# Patient Record
Sex: Female | Born: 1990 | Race: Black or African American | Hispanic: No | Marital: Single | Smoking: Current every day smoker
Health system: Southern US, Community
[De-identification: ages and names within clinical notes are randomized; demographics above are authoritative.]

## PROBLEM LIST (undated history)

## (undated) ENCOUNTER — Inpatient Hospital Stay (HOSPITAL_COMMUNITY): Payer: Self-pay

## (undated) ENCOUNTER — Emergency Department (HOSPITAL_COMMUNITY): Discharge status: Against Medical Advice | Payer: Self-pay

## (undated) DIAGNOSIS — N760 Acute vaginitis: Secondary | ICD-10-CM

## (undated) DIAGNOSIS — R51 Headache: Secondary | ICD-10-CM

## (undated) DIAGNOSIS — I2699 Other pulmonary embolism without acute cor pulmonale: Secondary | ICD-10-CM

## (undated) DIAGNOSIS — I10 Essential (primary) hypertension: Secondary | ICD-10-CM

## (undated) DIAGNOSIS — B9689 Other specified bacterial agents as the cause of diseases classified elsewhere: Secondary | ICD-10-CM

## (undated) DIAGNOSIS — N883 Incompetence of cervix uteri: Secondary | ICD-10-CM

## (undated) DIAGNOSIS — N390 Urinary tract infection, site not specified: Secondary | ICD-10-CM

## (undated) DIAGNOSIS — O1493 Unspecified pre-eclampsia, third trimester: Secondary | ICD-10-CM

## (undated) DIAGNOSIS — O139 Gestational [pregnancy-induced] hypertension without significant proteinuria, unspecified trimester: Secondary | ICD-10-CM

## (undated) DIAGNOSIS — O88219 Thromboembolism in pregnancy, unspecified trimester: Secondary | ICD-10-CM

## (undated) HISTORY — PX: THERAPEUTIC ABORTION: SHX798

## (undated) HISTORY — DX: Unspecified pre-eclampsia, third trimester: O14.93

## (undated) HISTORY — PX: BREAST FIBROADENOMA SURGERY: SHX580

## (undated) HISTORY — PX: DILATION AND CURETTAGE OF UTERUS: SHX78

## (undated) HISTORY — PX: TUBAL LIGATION: SHX77

---
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
## 2008-01-28 ENCOUNTER — Emergency Department (HOSPITAL_COMMUNITY): Admission: EM | Admit: 2008-01-28 | Discharge: 2008-01-28 | Payer: Self-pay | Admitting: Family Medicine

## 2008-08-03 ENCOUNTER — Emergency Department (HOSPITAL_COMMUNITY): Admission: EM | Admit: 2008-08-03 | Discharge: 2008-08-03 | Payer: Self-pay | Admitting: Emergency Medicine

## 2008-08-03 ENCOUNTER — Emergency Department (HOSPITAL_COMMUNITY): Admission: EM | Admit: 2008-08-03 | Discharge: 2008-08-03 | Payer: Self-pay | Admitting: Family Medicine

## 2008-08-13 ENCOUNTER — Emergency Department (HOSPITAL_COMMUNITY): Admission: EM | Admit: 2008-08-13 | Discharge: 2008-08-13 | Payer: Self-pay | Admitting: Emergency Medicine

## 2008-11-30 ENCOUNTER — Ambulatory Visit: Payer: Self-pay | Admitting: Internal Medicine

## 2008-11-30 ENCOUNTER — Encounter (INDEPENDENT_AMBULATORY_CARE_PROVIDER_SITE_OTHER): Payer: Self-pay | Admitting: Internal Medicine

## 2008-11-30 DIAGNOSIS — R3919 Other difficulties with micturition: Secondary | ICD-10-CM | POA: Insufficient documentation

## 2008-11-30 DIAGNOSIS — E669 Obesity, unspecified: Secondary | ICD-10-CM | POA: Insufficient documentation

## 2008-11-30 LAB — CONVERTED CEMR LAB
Bilirubin Urine: NEGATIVE
Blood in Urine, dipstick: NEGATIVE
Glucose, Urine, Semiquant: NEGATIVE
Ketones, urine, test strip: NEGATIVE
Nitrite: NEGATIVE
Protein, U semiquant: NEGATIVE
Specific Gravity, Urine: 1.015
Urobilinogen, UA: 1
WBC Urine, dipstick: NEGATIVE
pH: 6

## 2008-12-18 ENCOUNTER — Emergency Department (HOSPITAL_COMMUNITY): Admission: EM | Admit: 2008-12-18 | Discharge: 2008-12-19 | Payer: Self-pay | Admitting: Emergency Medicine

## 2009-01-03 ENCOUNTER — Encounter: Payer: Self-pay | Admitting: Internal Medicine

## 2009-01-03 ENCOUNTER — Ambulatory Visit: Payer: Self-pay | Admitting: Internal Medicine

## 2009-01-03 DIAGNOSIS — Z87891 Personal history of nicotine dependence: Secondary | ICD-10-CM

## 2009-02-18 ENCOUNTER — Emergency Department (HOSPITAL_COMMUNITY): Admission: EM | Admit: 2009-02-18 | Discharge: 2009-02-18 | Payer: Self-pay | Admitting: Emergency Medicine

## 2009-07-28 ENCOUNTER — Telehealth (INDEPENDENT_AMBULATORY_CARE_PROVIDER_SITE_OTHER): Payer: Self-pay | Admitting: *Deleted

## 2009-07-28 ENCOUNTER — Encounter (INDEPENDENT_AMBULATORY_CARE_PROVIDER_SITE_OTHER): Payer: Self-pay | Admitting: *Deleted

## 2009-11-01 ENCOUNTER — Inpatient Hospital Stay (HOSPITAL_COMMUNITY)
Admission: AD | Admit: 2009-11-01 | Discharge: 2009-11-01 | Payer: Self-pay | Source: Home / Self Care | Admitting: Obstetrics and Gynecology

## 2009-12-19 ENCOUNTER — Ambulatory Visit: Payer: Self-pay | Admitting: Obstetrics & Gynecology

## 2009-12-19 ENCOUNTER — Inpatient Hospital Stay (HOSPITAL_COMMUNITY): Admission: AD | Admit: 2009-12-19 | Discharge: 2009-12-19 | Payer: Self-pay | Admitting: Obstetrics & Gynecology

## 2010-01-07 ENCOUNTER — Ambulatory Visit: Payer: Self-pay | Admitting: Advanced Practice Midwife

## 2010-01-07 ENCOUNTER — Inpatient Hospital Stay (HOSPITAL_COMMUNITY): Admission: AD | Admit: 2010-01-07 | Discharge: 2010-01-07 | Payer: Self-pay | Admitting: Obstetrics & Gynecology

## 2010-01-21 ENCOUNTER — Emergency Department (HOSPITAL_COMMUNITY): Admission: EM | Admit: 2010-01-21 | Discharge: 2010-01-21 | Payer: Self-pay | Admitting: Emergency Medicine

## 2010-03-23 ENCOUNTER — Ambulatory Visit (HOSPITAL_COMMUNITY)
Admission: RE | Admit: 2010-03-23 | Discharge: 2010-03-23 | Payer: Self-pay | Source: Home / Self Care | Attending: Obstetrics & Gynecology | Admitting: Obstetrics & Gynecology

## 2010-04-13 ENCOUNTER — Ambulatory Visit (HOSPITAL_COMMUNITY)
Admission: RE | Admit: 2010-04-13 | Discharge: 2010-04-13 | Payer: Self-pay | Source: Home / Self Care | Attending: Obstetrics & Gynecology | Admitting: Obstetrics & Gynecology

## 2010-05-04 ENCOUNTER — Other Ambulatory Visit: Payer: Self-pay | Admitting: Obstetrics & Gynecology

## 2010-05-04 ENCOUNTER — Ambulatory Visit (HOSPITAL_COMMUNITY)
Admission: RE | Admit: 2010-05-04 | Discharge: 2010-05-04 | Payer: Self-pay | Source: Home / Self Care | Attending: Obstetrics & Gynecology | Admitting: Obstetrics & Gynecology

## 2010-05-04 DIAGNOSIS — IMO0001 Reserved for inherently not codable concepts without codable children: Secondary | ICD-10-CM

## 2010-05-09 NOTE — Progress Notes (Signed)
Summary: RECALL LETTER  Phone Note Outgoing Call   Call placed by: Gentry Fitz,  July 28, 2009 9:49 AM Call placed to: Patient Summary of Call: We attempted to call patient in order to schedule a return appointment, since her last [facility] office visit was more than one year ago.  Since we were unable to reach patient by phone, a letter was sent asking her to contact us.

## 2010-05-09 NOTE — Letter (Signed)
Summary: [facility] RECALL LETTER  All     ,     Phone:   Fax:     07/28/2009   [facility] [address] [place], [place]  [address]   Dear  [name],   You are due to follow-up with a doctor at the [facility].  We have been unable to contact you by phone.  If you would like to schedule a visit, please call [phone].  If you are receiving your health care somewhere else, please call us and we will take your name off our patient list.  Healthy regards,  Raynaldo Opitz, Director [facility]

## 2010-05-10 ENCOUNTER — Inpatient Hospital Stay (HOSPITAL_COMMUNITY)
Admission: AD | Admit: 2010-05-10 | Discharge: 2010-05-10 | Disposition: A | Discharge status: Home / Self Care | Payer: Medicaid Other | Source: Ambulatory Visit | Attending: Obstetrics | Admitting: Obstetrics

## 2010-05-10 ENCOUNTER — Inpatient Hospital Stay (HOSPITAL_COMMUNITY): Admission: AD | Admit: 2010-05-10 | Payer: Self-pay | Source: Ambulatory Visit | Admitting: Obstetrics

## 2010-05-10 DIAGNOSIS — O30009 Twin pregnancy, unspecified number of placenta and unspecified number of amniotic sacs, unspecified trimester: Secondary | ICD-10-CM

## 2010-05-10 DIAGNOSIS — O26839 Pregnancy related renal disease, unspecified trimester: Secondary | ICD-10-CM

## 2010-05-12 LAB — URINE CULTURE
Colony Count: >=100000
Culture  Setup Time: 201202021031

## 2010-05-15 ENCOUNTER — Inpatient Hospital Stay (HOSPITAL_COMMUNITY)
Admission: AD | Admit: 2010-05-15 | Discharge: 2010-05-22 | DRG: 765 | Disposition: A | Discharge status: Home / Self Care | Payer: Medicaid Other | Source: Ambulatory Visit | Attending: Obstetrics & Gynecology | Admitting: Obstetrics & Gynecology

## 2010-05-15 DIAGNOSIS — IMO0002 Reserved for concepts with insufficient information to code with codable children: Secondary | ICD-10-CM

## 2010-05-15 DIAGNOSIS — O1410 Severe pre-eclampsia, unspecified trimester: Secondary | ICD-10-CM

## 2010-05-15 DIAGNOSIS — O3090 Multiple gestation, unspecified, unspecified trimester: Secondary | ICD-10-CM | POA: Diagnosis present

## 2010-05-15 DIAGNOSIS — O30049 Twin pregnancy, dichorionic/diamniotic, unspecified trimester: Secondary | ICD-10-CM

## 2010-05-15 DIAGNOSIS — IMO0001 Reserved for inherently not codable concepts without codable children: Secondary | ICD-10-CM

## 2010-05-15 DIAGNOSIS — O1414 Severe pre-eclampsia complicating childbirth: Principal | ICD-10-CM | POA: Diagnosis present

## 2010-05-15 DIAGNOSIS — O30009 Twin pregnancy, unspecified number of placenta and unspecified number of amniotic sacs, unspecified trimester: Secondary | ICD-10-CM | POA: Diagnosis present

## 2010-05-15 DIAGNOSIS — O365990 Maternal care for other known or suspected poor fetal growth, unspecified trimester, not applicable or unspecified: Secondary | ICD-10-CM | POA: Diagnosis present

## 2010-05-15 LAB — COMPREHENSIVE METABOLIC PANEL
ALT: 9 U/L (ref 0–35)
AST: 45 U/L — ABNORMAL HIGH (ref 0–37)
Albumin: 2 g/dL — ABNORMAL LOW (ref 3.5–5.2)
Alkaline Phosphatase: 373 U/L — ABNORMAL HIGH (ref 39–117)
BUN: 6 mg/dL (ref 6–23)
CO2: 21 mEq/L (ref 19–32)
Chloride: 106 mEq/L (ref 96–112)
Creatinine, Ser: 0.86 mg/dL (ref 0.4–1.2)
GFR calc Af Amer: >60 mL/min (ref >60)
GFR calc non Af Amer: >60 mL/min (ref >60)
Potassium: 5.1 mEq/L (ref 3.5–5.1)
Sodium: 134 mEq/L — ABNORMAL LOW (ref 135–145)
Total Bilirubin: 1.2 mg/dL (ref 0.3–1.2)

## 2010-05-15 LAB — LACTATE DEHYDROGENASE: LDH: 386 U/L — ABNORMAL HIGH (ref 94–250)

## 2010-05-15 LAB — CBC
HCT: 34.2 % — ABNORMAL LOW (ref 36.0–46.0)
Hemoglobin: 11.5 g/dL — ABNORMAL LOW (ref 12.0–15.0)
MCH: 29 pg (ref 26.0–34.0)
MCHC: 33.6 g/dL (ref 30.0–36.0)
MCV: 86.1 fL (ref 78.0–100.0)
Platelets: 196 10*3/uL (ref 150–400)
RBC: 3.97 MIL/uL (ref 3.87–5.11)
RDW: 12.8 % (ref 11.5–15.5)

## 2010-05-15 LAB — ABO/RH: ABO/RH(D): O POS

## 2010-05-15 LAB — TYPE AND SCREEN
ABO/RH(D): O POS
Antibody Screen: NEGATIVE

## 2010-05-16 LAB — CBC
HCT: 32.4 % — ABNORMAL LOW (ref 36.0–46.0)
Hemoglobin: 10.9 g/dL — ABNORMAL LOW (ref 12.0–15.0)
MCH: 29.3 pg (ref 26.0–34.0)
MCHC: 33.6 g/dL (ref 30.0–36.0)
MCV: 87.1 fL (ref 78.0–100.0)
Platelets: 172 10*3/uL (ref 150–400)
RDW: 12.9 % (ref 11.5–15.5)
WBC: 7.7 10*3/uL (ref 4.0–10.5)

## 2010-05-16 LAB — LACTATE DEHYDROGENASE: LDH: 159 U/L (ref 94–250)

## 2010-05-16 LAB — COMPREHENSIVE METABOLIC PANEL
AST: 22 U/L (ref 0–37)
Albumin: 1.7 g/dL — ABNORMAL LOW (ref 3.5–5.2)
Alkaline Phosphatase: 325 U/L — ABNORMAL HIGH (ref 39–117)
BUN: 5 mg/dL — ABNORMAL LOW (ref 6–23)
Calcium: 8.4 mg/dL (ref 8.4–10.5)
Chloride: 107 mEq/L (ref 96–112)
Creatinine, Ser: 0.7 mg/dL (ref 0.4–1.2)
GFR calc Af Amer: >60 mL/min (ref >60)
GFR calc non Af Amer: >60 mL/min (ref >60)
Glucose, Bld: 93 mg/dL (ref 70–99)
Potassium: 4 mEq/L (ref 3.5–5.1)
Sodium: 137 mEq/L (ref 135–145)
Total Bilirubin: 0.4 mg/dL (ref 0.3–1.2)
Total Protein: 5.3 g/dL — ABNORMAL LOW (ref 6.0–8.3)

## 2010-05-17 ENCOUNTER — Inpatient Hospital Stay (HOSPITAL_COMMUNITY): Payer: Medicaid Other

## 2010-05-17 ENCOUNTER — Other Ambulatory Visit (HOSPITAL_COMMUNITY): Payer: Medicaid Other

## 2010-05-17 ENCOUNTER — Other Ambulatory Visit: Payer: Self-pay | Admitting: Obstetrics & Gynecology

## 2010-05-17 DIAGNOSIS — O1490 Unspecified pre-eclampsia, unspecified trimester: Secondary | ICD-10-CM

## 2010-05-17 LAB — CBC
MCH: 28.9 pg (ref 26.0–34.0)
MCHC: 33.3 g/dL (ref 30.0–36.0)
MCV: 86.5 fL (ref 78.0–100.0)
Platelets: 166 10*3/uL (ref 150–400)
Platelets: 178 10*3/uL (ref 150–400)
RBC: 3.94 MIL/uL (ref 3.87–5.11)
WBC: 8.2 10*3/uL (ref 4.0–10.5)

## 2010-05-17 LAB — COMPREHENSIVE METABOLIC PANEL
AST: 19 U/L (ref 0–37)
Albumin: 1.6 g/dL — ABNORMAL LOW (ref 3.5–5.2)
BUN: 2 mg/dL — ABNORMAL LOW (ref 6–23)
CO2: 23 mEq/L (ref 19–32)
Calcium: 8.3 mg/dL — ABNORMAL LOW (ref 8.4–10.5)
Creatinine, Ser: 0.75 mg/dL (ref 0.4–1.2)
GFR calc Af Amer: >60 mL/min (ref >60)
GFR calc non Af Amer: >60 mL/min (ref >60)

## 2010-05-17 LAB — URIC ACID: Uric Acid, Serum: 7.5 mg/dL — ABNORMAL HIGH (ref 2.4–7.0)

## 2010-05-18 ENCOUNTER — Other Ambulatory Visit (HOSPITAL_COMMUNITY): Payer: Medicaid Other

## 2010-05-18 ENCOUNTER — Ambulatory Visit (HOSPITAL_COMMUNITY): Payer: Medicaid Other

## 2010-05-18 LAB — COMPREHENSIVE METABOLIC PANEL
ALT: 12 U/L (ref 0–35)
AST: 27 U/L (ref 0–37)
Albumin: 1.6 g/dL — ABNORMAL LOW (ref 3.5–5.2)
Alkaline Phosphatase: 321 U/L — ABNORMAL HIGH (ref 39–117)
CO2: 24 mEq/L (ref 19–32)
Chloride: 108 mEq/L (ref 96–112)
Creatinine, Ser: 0.87 mg/dL (ref 0.4–1.2)
GFR calc Af Amer: >60 mL/min (ref >60)
GFR calc non Af Amer: >60 mL/min (ref >60)
Potassium: 3.9 mEq/L (ref 3.5–5.1)
Sodium: 139 mEq/L (ref 135–145)
Total Bilirubin: 0.2 mg/dL — ABNORMAL LOW (ref 0.3–1.2)

## 2010-05-18 LAB — CBC
Hemoglobin: 11.2 g/dL — ABNORMAL LOW (ref 12.0–15.0)
MCH: 28.9 pg (ref 26.0–34.0)
Platelets: 187 10*3/uL (ref 150–400)
RBC: 3.87 MIL/uL (ref 3.87–5.11)
WBC: 10.9 10*3/uL — ABNORMAL HIGH (ref 4.0–10.5)

## 2010-05-21 NOTE — H&P (Addendum)
NAMECRIS, [name]                 ACCOUNT NO.:  [id number]  MEDICAL RECORD NO.:  [id number]           PATIENT TYPE:  I  LOCATION:  [facility]                         [facility]  PHYSICIAN:  Roseanna Rainbow, M.D.DATE OF BIRTH:  Jan 28, 1991  DATE OF ADMISSION:  05/10/2010 DATE OF DISCHARGE:  05/10/2010                             HISTORY & PHYSICAL   CHIEF COMPLAINT:  The patient is a 20 year old gravida 1, para 0 with an estimated date of confinement by ultrasound of July 03, 2010, with a twin gestation now with proteinuria.  HISTORY OF PRESENT ILLNESS:  Please see the above.  Over the last several prenatal visits, proteinuria 2+ to 3+ was noted on urine dips. A urine culture on March 13, 2010, was positive for greater than 100,000 colonies per mL E-coli and this was treated.  On April 27, 2010, there was a negative test of cure.  The patient completed a 24-hour urine.  On May 11, 2010, a creatinine clearance was 68, and the protein was 3120 mg.  The patient's blood pressures were in the 120s to 180s range. Baseline blood pressure at 20 weeks was 108/73.  There is no significant edema or water weight gain.  ALLERGIES:  No known drug allergies.  MEDICATIONS:  Prenatal vitamins.  OB RISK FACTORS:  Please see the above.  The patient was initially diagnosed with a spontaneous triplet pregnancy and there was a vanishing fetus in the second trimester.  VACCINATIONS:  The patient received flu vaccine.  PRENATAL LABORATORY DATA:  A PIH panel was remarkable for creatinine of 0.96 on May 11, 2010, as well as uric acid of 7.5.  Chlamydia probe negative.  GC probe negative.  Two-hour GTT normal.  Hepatitis B surface antigen negative.  Hematocrit 34.3, hemoglobin 11.4, HIV nonreactive, platelets 195,000.  Blood type is O+, antibody screen negative, RPR nonreactive, rubella immune, sickle cell negative.  Ultrasound on February 21, 2010, at 21 weeks the estimated fetal  weight for twin A was at the 55th percentile, for twin B is 46th percentile, dividing membrane was seen, it was felt to be dichorionic, diamniotic, it is fundal.  An ultrasound on May 04, 2010, twin A was breech, normal amniotic fluid, estimated fetal weight was at the 29th percentile for a 33-week 4- day gestation, umbilical artery Dopplers were normal.  Twin B is cephalic at the 14th percentile for 33-week 4-day gestation, normal Dopplers.  On April 13, 2010, the estimated fetal weight for twin B was back to the 26th percentile for 30-week 4-day gestation, again umbilical artery Dopplers were normal.  Twin B was at the 13th percentile for 30- week 4-day gestation and umbilical artery Dopplers were normal,  normal amniotic fluid volumes again for both.  Asymmetric growth was noted for both twins.  On March 23, 2010, twin A was at the 27th percentile for a 27-week 4-day gestation, normal amniotic fluid, twin B was at the 25th percentile for 27 weeks 4 days gestation.  PAST GYN HISTORY:  Noncontributory.  PAST MEDICAL HISTORY:  Bronchitis.  SOCIAL HISTORY:  She is unemployed, single, does not give any  significant history of alcohol usage, has no significant smoking history.  Denies illicit drug use.  FAMILY HISTORY:  Remarkable for adult-onset diabetes, hypertension.  REVIEW OF SYSTEMS:  Noncontributory.  PHYSICAL EXAMINATION:  VITAL SIGNS:  Stable, afebrile. GENERAL:  No apparent distress. ABDOMEN:  Gravid. PELVIC:  Deferred.  ASSESSMENT:  Twin gestation at 32+ weeks amniotic dichorionic with asymmetric growth for the twins now with proteinuria, borderline blood pressure elevations.  PLAN:  This plan was reviewed with Dr. Rica Koyanagi of maternal fetal medicine who recommends admission with heightened surveillance of the twins as well as serial labs, possible delivery for worsening maternal or fetal status.     Roseanna Rainbow, M.D.     [name]  D:   05/15/2010  T:  05/15/2010  Job:  [id number]  Electronically Signed by Antionette Char M.D. on 06/19/2010 09:49:25 PM

## 2010-05-25 ENCOUNTER — Ambulatory Visit (HOSPITAL_COMMUNITY): Payer: Self-pay

## 2010-05-28 NOTE — Op Note (Signed)
NAMEKAINAT, PIZANA                 ACCOUNT NO.:  [id number]  MEDICAL RECORD NO.:  [id number]           PATIENT TYPE:  I  LOCATION:  9372                          [facility]  PHYSICIAN:  Roseanna Rainbow, M.D.DATE OF BIRTH:  06/09/1990  DATE OF PROCEDURE:  05/17/2010 DATE OF DISCHARGE:                              OPERATIVE REPORT   PREOPERATIVE DIAGNOSES:  Twin gestation at 21 plus weeks, diamniotic- dichorionic, severe preeclampsia, intrauterine growth restriction twin B with estimated fetal weight at less than 10th percentile.  POSTOPERATIVE DIAGNOSES:  Twin gestation at 55 plus weeks, diamniotic- dichorionic, severe preeclampsia, intrauterine growth restriction twin B with estimated fetal weight at less than 10th percentile.  PROCEDURE:  Primary low uterine flap elliptical cesarean delivery via transverse incision.  SURGEONS:  Roseanna Rainbow, MD and Coral Ceo, MD  ANESTHESIA:  Spinal.  FINDINGS:  Twin A frank breech, twin B cephalic presentation.  PATHOLOGY:  Placenta.  ESTIMATED BLOOD LOSS:  400 mL.  COMPLICATIONS:  None.  DESCRIPTION OF PROCEDURE:  The patient was taken to the operating room with an IV running.  A spinal anesthetic was administered.  She was then placed in the dorsal supine position with a leftward tilt and prepped and draped in usual sterile fashion.  After time-out had been completed, a transverse incision was made with a scalpel.  This was carried down to the underlying fascia with Bovie.  The fascia was nicked in the midline. This incision was then extended bilaterally.  The superior aspect of the fascial incision was tented up and the underlying rectus muscles dissected off.  The inferior aspect of the fascial incision was manipulated in a similar fashion.  The rectus muscles were separated in the midline.  The parietal peritoneum was tented up and entered sharply. This incision was then extended superiorly and inferiorly  with good visualization of the bladder.  The bladder blade was then placed.  The vesicouterine peritoneum was tented up and entered sharply.  This incision was then extended bilaterally and the bladder flap was created using blunt dissection.  The bladder blade was then replaced.  The lower uterine segment was incised in a transverse fashion with the scalpel. This incision was then extended bluntly.  Twin A was delivered as a complete breech extraction.  The cord was clamped and cut.  The head was delivered atraumatically.  The infant was handed off to the awaiting neonatologist.  Twin B was delivered in cephalic presentation.  The head was delivered atraumatically.  The cord was clamped and cut.  The infant was handed off to the awaiting neonatologist.  An umbilical artery pH were sent for both the fetuses as well as cord blood.  The placenta was then removed.  The intrauterine cavity was evacuated of any remaining amniotic fluid, clots, and debris with moistened laparotomy sponge.  The uterine incision was then reapproximated in a running fashion using a suture of 0 Monocryl.  A second imbricating layer of the same suture was then placed.  Adequate hemostasis was noted.  The paracolic gutters were then irrigated.  The parietal peritoneum was reapproximated in  a running fashion using a suture of 2-0 Vicryl.  The fascia was closed with two running sutures of 0 Vicryl tied in the midline.  2-0 plain was used to close the dead space and approximated the subcutaneous layer.  The skin was closed in a subcuticular fashion using a 4-0 sutures of Monocryl. At the close of the procedure, the instrument and pack counts were said to be correct x2.  The patient was taken to the PACU awake and in stable condition.     Roseanna Rainbow, M.D.     Judee Clara  D:  05/17/2010  T:  05/18/2010  Job:  [id number]  Electronically Signed by Antionette Char M.D. on 05/28/2010 02:35:42 PM

## 2010-05-28 NOTE — Discharge Summary (Signed)
  NAMESASCHA, [name]                 ACCOUNT NO.:  [id number]  MEDICAL RECORD NO.:  [id number]           PATIENT TYPE:  I  LOCATION:  9319                          [facility]  PHYSICIAN:  Roseanna Rainbow, M.D.DATE OF BIRTH:  Mar 21, 1991  DATE OF ADMISSION:  05/15/2010 DATE OF DISCHARGE:  05/22/2010                              DISCHARGE SUMMARY   CHIEF COMPLAINT:  The patient is a 20 year old para 0 with an estimated date of confinement by ultrasound of June 18, 2010, with a twin gestation and proteinuria.  HISTORY OF PRESENT ILLNESS:  Please see the dictated history and physical.  HOSPITAL COURSE:  The patient was admitted to the antenatal unit.  The patient's blood pressures were elevated in the 140s to 150s over 80s to 90s range.  On May 17, 2010, the uric acid was 7.5.  The remainder of her labs were normal.  The NSTs were reactive.  Maternal Fetal Medicine was consulted.  Twin B was noted to have IUGR with an estimated fetal weight at less than 10th percentile.  In the setting of the preeclampsia, the recommendation was made for delivery.  Twin A was breech presentation.  The patient was brought for a cesarean delivery. Please see the dictated operative summary for findings.  On postoperative day #1, a hemoglobin was 11.  The patient had been admitted to the AICU and started on magnesium sulfate for seizure prophylaxis. On May 19, 2010, the patient was noted to be diuresing and the magnesium sulfate was discontinued. She was transferred to the floor.  The patient had been started on labetalol orally to optimize blood pressure control.  The dosae was titrated to 200 mg p.o. t.i.d.  At discharge, the patient's blood pressures were better control.  DISCHARGE DIAGNOSES:  Twin gestation at 64 plus weeks severe preeclampsia intrauterine growth restriction.  PROCEDURE:  Cesarean delivery.  CONDITION:  Stable.  DIET:  Regular.  ACTIVITY:  Pelvic rest  progressive activity.  MEDICATIONS:  Labetalol 300 mg p.o. b.i.d. and Percocet 5/325 one to two tabs every 6 hours as needed.  DISPOSITION:  The patient was to follow up in the office in 2 days for blood pressure check.    Roseanna Rainbow, M.D.    [name]  D:  05/22/2010  T:  05/22/2010  Job:  [id number]  Electronically Signed by Antionette Char M.D. on 05/28/2010 02:37:54 PM

## 2010-06-06 ENCOUNTER — Encounter: Payer: Self-pay | Admitting: Internal Medicine

## 2010-06-22 LAB — URINE CULTURE: Colony Count: >=100000

## 2010-06-22 LAB — URINALYSIS, ROUTINE W REFLEX MICROSCOPIC
Glucose, UA: NEGATIVE mg/dL
Glucose, UA: NEGATIVE mg/dL
Protein, ur: NEGATIVE mg/dL
Specific Gravity, Urine: 1.02 (ref 1.005–1.030)
pH: 6 (ref 5.0–8.0)
pH: 6.5 (ref 5.0–8.0)

## 2010-06-22 LAB — URINE MICROSCOPIC-ADD ON

## 2010-06-22 LAB — WET PREP, GENITAL: Trich, Wet Prep: NONE SEEN

## 2010-06-24 LAB — POCT PREGNANCY, URINE: Preg Test, Ur: POSITIVE

## 2010-07-12 LAB — POCT CARDIAC MARKERS: CKMB, poc: <1 ng/mL — ABNORMAL LOW (ref 1.0–8.0)

## 2010-08-25 NOTE — Op Note (Signed)
NAMESHAREN, YOUNGREN                 ACCOUNT NO.:  [id number]   MEDICAL RECORD NO.:  [id number]          PATIENT TYPE:  AMB   LOCATION:  DSC                          FACILITY:  [facility]   PHYSICIAN:  Leonie Man, M.D.   DATE OF BIRTH:  10-19-1990   DATE OF PROCEDURE:  09/25/2004  DATE OF DISCHARGE:                                 OPERATIVE REPORT   PREOPERATIVE DIAGNOSIS:  Fibroadenoma, right breast.   POSTOPERATIVE DIAGNOSIS:  Fibroadenoma, right breast.   PROCEDURE:  Excision of fibroadenoma, right breast.   SURGEON:  Leonie Man, M.D.   ASSISTANT:  OR tech.   ANESTHESIA:  General.   The patient a 20 year old, female student presenting with two newly  discovered breast masses in the upper inner quadrant at approximately the 1  o'clock axis. She comes to the operating room now desiring excision after  consultation with both her mother and father. She understands the risks and  potential benefits of surgery and gives consent.   DESCRIPTION OF PROCEDURE:  Following induction of satisfactory general  anesthesia, the patient was positioned supinely. The right breast was  prepped and draped to be included in a sterile operative field. A  circumareolar incision was carried down along the superior areolar border  and deepened through skin and subcutaneous tissue. Dissection carried up to  the masses which are lying adjacent to each other and are approximately 7 cm  away from the areolar border. The masses are dissected free individually,  removed and forwarded for pathologic evaluation. They clinically are  fibroadenoma. Hemostasis obtained within the breast using electrocautery.  Sponge and instrument counts verified. Subcutaneous tissues closed with  interrupted 3-0 Vicryl sutures and the skin closed with running 5-0 Monocryl  suture and then reinforced with Steri-Strips. Sterile dressings are applied.  Anesthetic reversed. The patient was removed from the operating room to the  recovery room in stable condition. She tolerated the procedure well.       PB/MEDQ  D:  09/25/2004  T:  09/25/2004  Job:  [id number]

## 2010-10-14 ENCOUNTER — Inpatient Hospital Stay (HOSPITAL_COMMUNITY)
Admission: AD | Admit: 2010-10-14 | Discharge: 2010-10-14 | Disposition: A | Discharge status: Home / Self Care | Payer: Medicaid Other | Source: Ambulatory Visit | Attending: Obstetrics & Gynecology | Admitting: Obstetrics & Gynecology

## 2010-10-14 ENCOUNTER — Encounter (HOSPITAL_COMMUNITY): Payer: Self-pay | Admitting: Obstetrics and Gynecology

## 2010-10-14 ENCOUNTER — Inpatient Hospital Stay (HOSPITAL_COMMUNITY): Payer: Medicaid Other | Admitting: Obstetrics & Gynecology

## 2010-10-14 DIAGNOSIS — B3731 Acute candidiasis of vulva and vagina: Secondary | ICD-10-CM | POA: Insufficient documentation

## 2010-10-14 DIAGNOSIS — B373 Candidiasis of vulva and vagina: Secondary | ICD-10-CM

## 2010-10-14 DIAGNOSIS — L293 Anogenital pruritus, unspecified: Secondary | ICD-10-CM | POA: Insufficient documentation

## 2010-10-14 LAB — GLUCOSE, CAPILLARY: Glucose-Capillary: 89 mg/dL (ref 70–99)

## 2010-10-14 LAB — WET PREP, GENITAL
Clue Cells Wet Prep HPF POC: NONE SEEN
Trich, Wet Prep: NONE SEEN
Yeast Wet Prep HPF POC: NONE SEEN

## 2010-10-14 MED ORDER — FLUCONAZOLE 150 MG PO TABS
150.0000 mg | ORAL_TABLET | Freq: Once | ORAL | Status: AC
  Administered 2010-10-14: 150 mg via ORAL

## 2010-10-14 MED ORDER — FLUCONAZOLE 150 MG PO TABS
ORAL_TABLET | ORAL | Status: AC
  Administered 2010-10-14: 150 mg via ORAL
  Filled 2010-10-14: qty 1

## 2010-10-14 NOTE — Progress Notes (Signed)
Pt presents to MAU with symptoms of Yeast infection. Pt states her skin is breaking on the outside. Pt states when she takes a bath her skin feels raw. Pt states the symptoms started 3 weeks ago. No odor or discharge.

## 2010-10-14 NOTE — Progress Notes (Signed)
Vaginal itching for 3 weeks, had twins on 05/17/10 by C/Sno discharge, no odor

## 2010-10-14 NOTE — ED Provider Notes (Signed)
History     Chief Complaint  Patient presents with  . Vaginal Itching   Patient is a 20 y.o. female presenting with vaginal itching. The history is provided by the patient.  Vaginal Itching This is a new problem. The current episode started 1 to 4 weeks ago. The problem occurs constantly. The problem has been gradually worsening. She has tried nothing for the symptoms.      No past medical history on file.  Past Surgical History  Procedure Date  . 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    Removal  . Cesarean section     Family History  Problem Relation Age of Onset  . Diabetes Mother   . Diabetes Maternal Grandmother   . Hypertension Maternal Grandmother   . Cancer Maternal Grandmother     colon cancer, brain tumour    History  Substance Use Topics  . Smoking status: Former Games developer  . Smokeless tobacco: Not on file  . Alcohol Use: No    Allergies: No Known Allergies  No prescriptions prior to admission    Review of Systems  Genitourinary:       Positive for vaginal itching and d/c.  All other systems reviewed and are negative.   Physical Exam   Blood pressure 131/80, pulse 94, temperature 98 F (36.7 C), temperature source Oral, resp. rate 18, height 5' 4.5" (1.638 m), weight 225 lb (102.059 kg), last menstrual period 09/24/2010, unknown if currently breastfeeding.  Physical Exam  Constitutional: She is oriented to person, place, and time. She appears well-developed and well-nourished.  HENT:  Head: Normocephalic.  GI: Soft.       Nontender  Genitourinary:       Thick white vaginal d/c that is blood tinged.  Neurological: She is alert and oriented to person, place, and time.  Skin: Skin is warm and dry.    MAU Course  Procedures  MDM    Kerrie Buffalo, NP 10/14/10 1231

## 2010-12-28 LAB — POCT RAPID STREP A: Streptococcus, Group A Screen (Direct): NEGATIVE

## 2011-06-18 ENCOUNTER — Inpatient Hospital Stay (HOSPITAL_COMMUNITY): Payer: Medicaid Other

## 2011-06-18 ENCOUNTER — Encounter (HOSPITAL_COMMUNITY): Payer: Self-pay | Admitting: *Deleted

## 2011-06-18 ENCOUNTER — Inpatient Hospital Stay (HOSPITAL_COMMUNITY)
Admission: AD | Admit: 2011-06-18 | Discharge: 2011-06-18 | Disposition: A | Discharge status: Home / Self Care | Payer: Medicaid Other | Source: Ambulatory Visit | Attending: Obstetrics & Gynecology | Admitting: Obstetrics & Gynecology

## 2011-06-18 DIAGNOSIS — O26899 Other specified pregnancy related conditions, unspecified trimester: Secondary | ICD-10-CM

## 2011-06-18 DIAGNOSIS — O99891 Other specified diseases and conditions complicating pregnancy: Secondary | ICD-10-CM | POA: Insufficient documentation

## 2011-06-18 DIAGNOSIS — R109 Unspecified abdominal pain: Secondary | ICD-10-CM | POA: Insufficient documentation

## 2011-06-18 NOTE — Discharge Instructions (Signed)
Abdominal Pain During Pregnancy  Abdominal discomfort is common in pregnancy. Most of the time, it does not cause harm. There are many causes of abdominal pain. Some causes are more serious than others. Some of the causes of abdominal pain in pregnancy are easily diagnosed. Occasionally, the diagnosis takes time to understand. Other times, the cause is not determined. Abdominal pain can be a sign that something is very wrong with the pregnancy, or the pain may have nothing to do with the pregnancy at all. For this reason, always tell your caregiver if you have any abdominal discomfort.  CAUSES  Common and harmless causes of abdominal pain include:   Constipation.   Excess gas and bloating.   Round ligament pain. This is pain that is felt in the folds of the groin.   The position the baby or placenta is in.   Baby kicks.   Braxton-Hicks contractions. These are mild contractions that do not cause cervical dilation.  Serious causes of abdominal pain include:   Ectopic pregnancy. This happens when a fertilized egg implants outside of the uterus.   Miscarriage.   Preterm labor. This is when labor starts at less than 37 weeks of pregnancy.   Placental abruption. This is when the placenta partially or completely separates from the uterus.   Preeclampsia. This is often associated with high blood pressure and has been referred to as "toxemia in pregnancy."   Uterine or amniotic fluid infections.  Causes unrelated to pregnancy include:   Urinary tract infection.   Gallbladder stones or inflammation.   Hepatitis or other liver illness.   Intestinal problems, stomach flu, food poisoning, or ulcer.   Appendicitis.   Kidney (renal) stones.   Kidney infection (pylonephritis).  HOME CARE INSTRUCTIONS   For mild pain:   Do not have sexual intercourse or put anything in your vagina until your symptoms go away completely.   Get plenty of rest until your pain improves. If your pain does not improve in 1 hour, call  your caregiver.   Drink clear fluids if you feel nauseous. Avoid solid food as long as you are uncomfortable or nauseous.   Only take medicine as directed by your caregiver.   Keep all follow-up appointments with your caregiver.  SEEK IMMEDIATE MEDICAL CARE IF:   You are bleeding, leaking fluid, or passing tissue from the vagina.   You have increasing pain or cramping.   You have persistent vomiting.   You have painful or bloody urination.   You have a fever.   You notice a decrease in your baby's movements.   You have extreme weakness or feel faint.   You have shortness of breath, with or without abdominal pain.   You develop a severe headache with abdominal pain.   You have abnormal vaginal discharge with abdominal pain.   You have persistent diarrhea.   You have abdominal pain that continues even after rest, or gets worse.  MAKE SURE YOU:    Understand these instructions.   Will watch your condition.   Will get help right away if you are not doing well or get worse.  Document Released: 03/26/2005 Document Revised: 03/15/2011 Document Reviewed: 10/20/2010  Exit[employer] Patient Information 2012 [employer].

## 2011-06-18 NOTE — MAU Note (Signed)
No period March, Feb menses unsure of length, January normal period, cramping when hungry, no bleeding

## 2011-06-18 NOTE — MAU Provider Note (Signed)
History     CSN: [id number]  Arrival date and time: 06/18/11 1543   First Provider Initiated Contact with Patient 06/18/11 1622      Chief Complaint  Patient presents with  . Possible Pregnancy   HPI [name] is 21 y.o. G1P3  Unknown weeks presenting with cramps.  Called Dr. Tamela Oddi today and instructed to come here for evaluation.  LMP in Feb, unsure of date.  Has + UPT at office 4 days ago.  Sent here to find out how far along she was because "they couldn't do it in the office".   She last had injection of  Depo in June, no contraception since.  She is having cramping.  Denies vaginal bleeding.   She has 23 year old triplets.    No past medical history on file.  Past Surgical History  Procedure Date  . 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    Removal  . Cesarean section     Family History  Problem Relation Age of Onset  . Diabetes Mother   . Diabetes Maternal Grandmother   . Hypertension Maternal Grandmother   . Cancer Maternal Grandmother     colon cancer, brain tumour    History  Substance Use Topics  . Smoking status: Former Games developer  . Smokeless tobacco: Not on file  . Alcohol Use: No    Allergies: No Known Allergies  No prescriptions prior to admission    Review of Systems  Constitutional: Negative.  Negative for fever and chills.  Gastrointestinal: Positive for abdominal pain (cramping.). Negative for nausea and vomiting.  Genitourinary:       Negative for vaginal bleeding  Psychiatric/Behavioral: The patient is not nervous/anxious.    Physical Exam   Last menstrual period 05/13/2011.  Physical Exam  Constitutional: She is oriented to person, place, and time. She appears well-developed and well-nourished.  HENT:  Head: Normocephalic.  Respiratory: Effort normal.  GI: There is no tenderness. There is no rebound and no guarding.  Genitourinary: Uterus is not enlarged and not tender. Cervix exhibits no discharge. Right adnexum displays no  mass, no tenderness and no fullness. Left adnexum displays no mass, no tenderness and no fullness. No bleeding around the vagina. No vaginal discharge found.  Neurological: She is alert and oriented to person, place, and time.  Skin: Skin is warm and dry.   Results for orders placed during the hospital encounter of 06/18/11 (from the past 24 hour(s))  POCT PREGNANCY, URINE     Status: Abnormal   Collection Time   06/18/11  4:10 PM      Component Value Range   Preg Test, Ur POSITIVE (*) NEGATIVE   HCG, QUANTITATIVE, PREGNANCY     Status: Abnormal   Collection Time   06/18/11  4:39 PM      Component Value Range   hCG, Beta Chain, Quant, S 2385 (*) <5 (mIU/mL)       *RADIOLOGY REPORT*  Clinical Data: Pregnancy. Cramping. Unsure of LMP.  OBSTETRIC <14 WK [place] AND TRANSVAGINAL OB US  Technique: Both transabdominal and transvaginal ultrasound  examinations were performed for complete evaluation of the  gestation as well as the maternal uterus, adnexal regions, and  pelvic cul-de-sac. Transvaginal technique was performed to assess  early pregnancy.  Comparison: None.  Intrauterine gestational sac: Probable early intrauterine  gestational sac.  Yolk sac: Not visualized  Embryo: Not visualized  MSD: 4.5 mm 5 w 0 d  Maternal uterus/adnexae:  No subchorionic hemorrhage. Probable corpus luteum  cyst within the  left ovary. Right ovary is unremarkable within normal limits.  Trace amount of free pelvic fluid is seen. No adnexal mass is seen.  IMPRESSION:  1. Probable early intrauterine gestational sac. Suggest follow-up  ultrasound in 2 weeks to confirm the suspected early intrauterine  pregnancy.  2. No adnexal mass identified.  Original Report Authenticated By: Britta Mccreedy, M.D.     MAU Course  Procedures    BLOOD TYPE PREVIOUS RECORD==O Positive  MDM   Assessment and Plan  A;  Cramping in early pregnancy- probably IUGS [redacted]w[redacted]d      P:  Reported results to patient.  Encouraged her  to make appointment with her doctor to repeat ultrasound her radiologist's recommendation     Report to her doctor any severe abdominal pain or vaginal bleeding.  Arryn Terrones,EVE M 06/18/2011, 4:23 PM

## 2011-06-18 NOTE — MAU Note (Signed)
Went to [facility] for Depo Shot, was told she was pregnant, urine test done, no other test done, did not see MD that visit, told to come to [facility] for how far pregnancy

## 2011-08-07 ENCOUNTER — Emergency Department (HOSPITAL_COMMUNITY)
Admission: EM | Admit: 2011-08-07 | Discharge: 2011-08-08 | Disposition: A | Discharge status: Home / Self Care | Payer: Medicaid Other | Attending: Emergency Medicine | Admitting: Emergency Medicine

## 2011-08-07 ENCOUNTER — Encounter (HOSPITAL_COMMUNITY): Payer: Self-pay | Admitting: Family Medicine

## 2011-08-07 ENCOUNTER — Emergency Department (HOSPITAL_COMMUNITY): Payer: Medicaid Other

## 2011-08-07 DIAGNOSIS — R42 Dizziness and giddiness: Secondary | ICD-10-CM | POA: Insufficient documentation

## 2011-08-07 DIAGNOSIS — K0889 Other specified disorders of teeth and supporting structures: Secondary | ICD-10-CM

## 2011-08-07 DIAGNOSIS — Z79899 Other long term (current) drug therapy: Secondary | ICD-10-CM | POA: Insufficient documentation

## 2011-08-07 DIAGNOSIS — R0602 Shortness of breath: Secondary | ICD-10-CM | POA: Insufficient documentation

## 2011-08-07 DIAGNOSIS — J069 Acute upper respiratory infection, unspecified: Secondary | ICD-10-CM | POA: Insufficient documentation

## 2011-08-07 DIAGNOSIS — I10 Essential (primary) hypertension: Secondary | ICD-10-CM | POA: Insufficient documentation

## 2011-08-07 DIAGNOSIS — N390 Urinary tract infection, site not specified: Secondary | ICD-10-CM | POA: Insufficient documentation

## 2011-08-07 DIAGNOSIS — K089 Disorder of teeth and supporting structures, unspecified: Secondary | ICD-10-CM | POA: Insufficient documentation

## 2011-08-07 DIAGNOSIS — R079 Chest pain, unspecified: Secondary | ICD-10-CM | POA: Insufficient documentation

## 2011-08-07 HISTORY — DX: Essential (primary) hypertension: I10

## 2011-08-07 LAB — CBC
MCHC: 33.2 g/dL (ref 30.0–36.0)
Platelets: 277 10*3/uL (ref 150–400)
RDW: 13.6 % (ref 11.5–15.5)

## 2011-08-07 LAB — BASIC METABOLIC PANEL
GFR calc Af Amer: >90 mL/min (ref >90)
GFR calc non Af Amer: >90 mL/min (ref >90)
Potassium: 3.3 mEq/L — ABNORMAL LOW (ref 3.5–5.1)
Sodium: 132 mEq/L — ABNORMAL LOW (ref 135–145)

## 2011-08-07 LAB — POCT PREGNANCY, URINE: Preg Test, Ur: NEGATIVE

## 2011-08-07 LAB — TROPONIN I: Troponin I: <0.3 ng/mL (ref <0.30)

## 2011-08-07 MED ORDER — MORPHINE SULFATE 4 MG/ML IJ SOLN
4.0000 mg | Freq: Once | INTRAMUSCULAR | Status: AC
  Administered 2011-08-07: 4 mg via INTRAVENOUS
  Filled 2011-08-07: qty 1

## 2011-08-07 MED ORDER — SODIUM CHLORIDE 0.9 % IV BOLUS (SEPSIS)
1000.0000 mL | Freq: Once | INTRAVENOUS | Status: AC
  Administered 2011-08-07: 1000 mL via INTRAVENOUS

## 2011-08-07 NOTE — ED Notes (Signed)
Patient states that she started having a toothache yesterday. Also c/o dizziness, chest pain off and on since yesterday,states "feel like my heart beating fast" and shortness of breath. More short of breath with ambulation. Patient currently on oral contraceptives.

## 2011-08-08 DIAGNOSIS — R42 Dizziness and giddiness: Secondary | ICD-10-CM | POA: Diagnosis present

## 2011-08-08 LAB — URINALYSIS, ROUTINE W REFLEX MICROSCOPIC
Bilirubin Urine: NEGATIVE
Nitrite: POSITIVE — AB
Specific Gravity, Urine: 1.011 (ref 1.005–1.030)
Urobilinogen, UA: 0.2 mg/dL (ref 0.0–1.0)

## 2011-08-08 LAB — URINE MICROSCOPIC-ADD ON

## 2011-08-08 MED ORDER — CIPROFLOXACIN HCL 500 MG PO TABS: 500.0000 mg | ORAL_TABLET | Freq: Two times a day (BID) | ORAL | Status: AC

## 2011-08-08 MED ORDER — HYDROCODONE-ACETAMINOPHEN 5-325 MG PO TABS: 1.0000 | ORAL_TABLET | ORAL | Status: AC | PRN

## 2011-08-08 MED ORDER — PENICILLIN G BENZATHINE 1200000 UNIT/2ML IM SUSP
1.2000 10*6.[IU] | Freq: Once | INTRAMUSCULAR | Status: AC
  Administered 2011-08-08: 1.2 10*6.[IU] via INTRAMUSCULAR
  Filled 2011-08-08: qty 2

## 2011-08-08 MED ORDER — MECLIZINE HCL 25 MG PO TABS: 25.0000 mg | ORAL_TABLET | Freq: Four times a day (QID) | ORAL | Status: AC

## 2011-08-08 MED ORDER — POTASSIUM CHLORIDE CRYS ER 20 MEQ PO TBCR
40.0000 meq | EXTENDED_RELEASE_TABLET | Freq: Once | ORAL | Status: AC
  Administered 2011-08-08: 40 meq via ORAL
  Filled 2011-08-08: qty 2
  Filled 2011-08-08: qty 1

## 2011-08-08 NOTE — Discharge Instructions (Signed)
Dental Pain A tooth ache may be caused by cavities (tooth decay). Cavities expose the nerve of the tooth to air and hot or cold temperatures. It may come from an infection or abscess (also called a boil or furuncle) around your tooth. It is also often caused by dental caries (tooth decay). This causes the pain you are having. DIAGNOSIS  Your caregiver can diagnose this problem by exam. TREATMENT   If caused by an infection, it may be treated with medications which kill germs (antibiotics) and pain medications as prescribed by your caregiver. Take medications as directed.   Only take over-the-counter or prescription medicines for pain, discomfort, or fever as directed by your caregiver.   Whether the tooth ache today is caused by infection or dental disease, you should see your dentist as soon as possible for further care.  SEEK MEDICAL CARE IF: The exam and treatment you received today has been provided on an emergency basis only. This is not a substitute for complete medical or dental care. If your problem worsens or new problems (symptoms) appear, and you are unable to meet with your dentist, call or return to this location. SEEK IMMEDIATE MEDICAL CARE IF:   You have a fever.   You develop redness and swelling of your face, jaw, or neck.   You are unable to open your mouth.   You have severe pain uncontrolled by pain medicine.  MAKE SURE YOU:   Understand these instructions.   Will watch your condition.   Will get help right away if you are not doing well or get worse.  Document Released: 03/26/2005 Document Revised: 03/15/2011 Document Reviewed: 11/12/2007 [facility] Patient Information 2012 [place], [place].Benign Positional Vertigo Vertigo means you feel like you or your surroundings are moving when they are not. Benign positional vertigo is the most common form of vertigo. Benign means that the cause of your condition is not serious. Benign positional vertigo is more common in older  adults. CAUSES  Benign positional vertigo is the result of an upset in the labyrinth system. This is an area in the middle ear that helps control your balance. This may be caused by a viral infection, head injury, or repetitive motion. However, often no specific cause is found. SYMPTOMS  Symptoms of benign positional vertigo occur when you move your head or eyes in different directions. Some of the symptoms may include:  Loss of balance and falls.   Vomiting.   Blurred vision.   Dizziness.   Nausea.   Involuntary eye movements (nystagmus).  DIAGNOSIS  Benign positional vertigo is usually diagnosed by physical exam. If the specific cause of your benign positional vertigo is unknown, your caregiver may perform imaging tests, such as magnetic resonance imaging (MRI) or computed tomography (CT). TREATMENT  Your caregiver may recommend movements or procedures to correct the benign positional vertigo. Medicines such as meclizine, benzodiazepines, and medicines for nausea may be used to treat your symptoms. In rare cases, if your symptoms are caused by certain conditions that affect the inner ear, you may need surgery. HOME CARE INSTRUCTIONS   Follow your caregiver's instructions.   Move slowly. Do not make sudden body or head movements.   Avoid driving.   Avoid operating heavy machinery.   Avoid performing any tasks that would be dangerous to you or others during a vertigo episode.   Drink enough fluids to keep your urine clear or pale yellow.  SEEK IMMEDIATE MEDICAL CARE IF:   You develop problems with walking, weakness,  numbness, or using your arms, hands, or legs.   You have difficulty speaking.   You develop severe headaches.   Your nausea or vomiting continues or gets worse.   You develop visual changes.   Your family or friends notice any behavioral changes.   Your condition gets worse.   You have a fever.   You develop a stiff neck or sensitivity to light.  MAKE  SURE YOU:   Understand these instructions.   Will watch your condition.   Will get help right away if you are not doing well or get worse.  Document Released: 01/01/2006 Document Revised: 03/15/2011 Document Reviewed: 12/14/2010 [facility] Patient Information 2012 [place], [place].Urinary Tract Infection Infections of the urinary tract can start in several places. A bladder infection (cystitis), a kidney infection (pyelonephritis), and a prostate infection (prostatitis) are different types of urinary tract infections (UTIs). They usually get better if treated with medicines (antibiotics) that kill germs. Take all the medicine until it is gone. You or your child may feel better in a few days, but TAKE ALL MEDICINE or the infection may not respond and may become more difficult to treat. HOME CARE INSTRUCTIONS   Drink enough water and fluids to keep the urine clear or pale yellow. Cranberry juice is especially recommended, in addition to large amounts of water.   Avoid caffeine, tea, and carbonated beverages. They tend to irritate the bladder.   Alcohol may irritate the prostate.   Only take over-the-counter or prescription medicines for pain, discomfort, or fever as directed by your caregiver.  To prevent further infections:  Empty the bladder often. Avoid holding urine for long periods of time.   After a bowel movement, women should cleanse from front to back. Use each tissue only once.   Empty the bladder before and after sexual intercourse.  FINDING OUT THE RESULTS OF YOUR TEST Not all test results are available during your visit. If your or your child's test results are not back during the visit, make an appointment with your caregiver to find out the results. Do not assume everything is normal if you have not heard from your caregiver or the medical facility. It is important for you to follow up on all test results. SEEK MEDICAL CARE IF:   There is back pain.   Your baby is older than  3 months with a rectal temperature of 100.5 F (38.1 C) or higher for more than 1 day.   Your or your child's problems (symptoms) are no better in 3 days. Return sooner if you or your child is getting worse.  SEEK IMMEDIATE MEDICAL CARE IF:   There is severe back pain or lower abdominal pain.   You or your child develops chills.   You have a fever.   Your baby is older than 3 months with a rectal temperature of 102 F (38.9 C) or higher.   Your baby is 53 months old or younger with a rectal temperature of 100.4 F (38 C) or higher.   There is nausea or vomiting.   There is continued burning or discomfort with urination.  MAKE SURE YOU:   Understand these instructions.   Will watch your condition.   Will get help right away if you are not doing well or get worse.  Document Released: 01/03/2005 Document Revised: 03/15/2011 Document Reviewed: 08/08/2006 [facility] Patient Information 2012 [place], [place].

## 2011-08-08 NOTE — ED Provider Notes (Signed)
History     CSN: [id number]  Arrival date & time 08/07/11  4401   First MD Initiated Contact with Patient 08/07/11 2239      Chief Complaint  Patient presents with  . Shortness of Breath  . Chest Pain  . Dental Pain    (Consider location/radiation/quality/duration/timing/severity/associated sxs/prior treatment) HPI  Patient presents to the emergency department by EMS for multiple complaints with dental pain, chest pain when she coughs, shortness of breath, dizziness.  Dental Pain: Patient presents to the emergency department with a dental complaint. Symptoms began a couple of days ago. The patient has tried to alleviate pain with OTC medications.  Pain rated at a 10/10, characterized as throbbing in nature and located right upper and lower molars. Patient denies  night sweats, chills, difficulty swallowing or opening mouth,, nuchal rigidity or decreased ROM of neck.    Chest Pain: Patient complains of chest pain. IT has been on and off since yesterday. Her pain hurts when she coughs and it is mild. The pain does not radiate anywhere. The pain is a 4/10. . Aggravating factors are coughing.  Alleviating factors are: none. Patient's cardiac risk factors are hypertension.  Patient's risk factors for DVT/PE: estrogen replacement therapy.   .Shortness of Breath: Patient admits that she was feeling short of breath before she came and that her heart was beating fast. Upon my arrival into the ED, she is no longer feeling short of breath or tachycardic at 126 like she was at triage. Her pulse is < 100 now. She admits to feeling anxious upon arrival and being in pain because of her teeth were hurting. But admits that they are not hurting quite as bad now and so her SOB and "fast heart racing" has gone away.  Dizziness: .Patient complaints of dizziness intermittently since this afternoon. She states that she has not been eating and drinking normal today because she has not been feeling well. She  denies change in vision, headache, weakness (focal or generalized. She describes her dizziness as spinning around in circles and it is intermittent.    Past Medical History  Diagnosis Date  . Hypertension     Past Surgical History  Procedure Date  . 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    Removal  . Cesarean section     Family History  Problem Relation Age of Onset  . Diabetes Mother   . Diabetes Maternal Grandmother   . Hypertension Maternal Grandmother   . Cancer Maternal Grandmother     colon cancer, brain tumour    History  Substance Use Topics  . Smoking status: Former Games developer  . Smokeless tobacco: Not on file  . Alcohol Use: No    OB History    Grav Para Term Preterm Abortions TAB SAB Ect Mult Living   3 2        2       Review of Systems   HEENT: denies blurry vision or change in hearing PULMONARY: Denies difficulty breathing  CARDIAC: denies heart palpitations MUSCULOSKELETAL:  denies being unable to ambulate ABDOMEN AL: denies abdominal pain GU: denies loss of bowel or urinary control NEURO: denies numbness and tingling in extremities   Allergies  Review of patient's allergies indicates no known allergies.  Home Medications   Current Outpatient Rx  Name Route Sig Dispense Refill  . CIPROFLOXACIN HCL 500 MG PO TABS Oral Take 1 tablet (500 mg total) by mouth 2 (two) times daily. 28 tablet 0  . HYDROCODONE-ACETAMINOPHEN 5-325 MG PO  TABS Oral Take 1 tablet by mouth every 4 (four) hours as needed for pain. 10 tablet 0  . MECLIZINE HCL 25 MG PO TABS Oral Take 1 tablet (25 mg total) by mouth 4 (four) times daily. 28 tablet 0    BP 108/52  Pulse 97  Temp(Src) 98.9 F (37.2 C) (Oral)  Resp 16  Ht 5\' 3"  (1.6 m)  Wt 212 lb (96.163 kg)  BMI 37.55 kg/m2  SpO2 100%  LMP 08/04/2011  Breastfeeding? No  Physical Exam  Nursing note and vitals reviewed. Constitutional: She is oriented to person, place, and time. She appears well-developed and  well-nourished. No distress.  HENT:  Head: Normocephalic.  Right Ear: External ear normal.  Left Ear: External ear normal.  Nose: Nose normal.  Mouth/Throat: Oropharynx is clear and moist.  Eyes: Conjunctivae are normal. Pupils are equal, round, and reactive to light.  Neck: Normal range of motion. Neck supple.  Cardiovascular: Regular rhythm and normal heart sounds.   Pulmonary/Chest: Effort normal. No respiratory distress. She has no wheezes. She exhibits no tenderness and no retraction.  Abdominal: Soft. She exhibits no distension. There is no tenderness. There is no rebound and no guarding.  Musculoskeletal: Normal range of motion.  Neurological: She is oriented to person, place, and time. She has normal strength. No sensory deficit. Cranial nerve deficit: 3-12 physiologic. Coordination normal. GCS eye subscore is 4. GCS verbal subscore is 5. GCS motor subscore is 6.  Skin: Skin is warm. She is not diaphoretic.    ED Course  Procedures (including critical care time)  Labs Reviewed  CBC - Abnormal; Notable for the following:    WBC 12.0 (*)    All other components within normal limits  BASIC METABOLIC PANEL - Abnormal; Notable for the following:    Sodium 132 (*)    Potassium 3.3 (*)    All other components within normal limits  URINALYSIS, ROUTINE W REFLEX MICROSCOPIC - Abnormal; Notable for the following:    APPearance CLOUDY (*)    Hgb urine dipstick LARGE (*)    Ketones, ur 15 (*)    Nitrite POSITIVE (*)    Leukocytes, UA TRACE (*)    All other components within normal limits  URINE MICROSCOPIC-ADD ON - Abnormal; Notable for the following:    Bacteria, UA MANY (*)    All other components within normal limits  TROPONIN I  POCT PREGNANCY, URINE   Dg Chest 2 View  08/07/2011  *RADIOLOGY REPORT*  Clinical Data: Fever, chest pain, tachypnea  CHEST - 2 VIEW  Comparison: 03/13/2006  Findings: Grossly unchanged cardiac silhouette mediastinal contours.  No focal parenchymal  opacities.  No pleural effusion or pneumothorax.  Unchanged bones.  IMPRESSION: No acute cardiopulmonary disease.  Original Report Authenticated By: Waynard Reeds, M.D.     1. Pain, dental   2. UTI (lower urinary tract infection)   3. Vertigo       MDM     Date: 08/08/2011  Rate: 111  Rhythm: sinus tachycardia  QRS Axis: normal  Intervals: normal  ST/T Wave abnormalities: nonspecific T wave changes  Conduction Disutrbances:none  Narrative Interpretation:   Old EKG Reviewed: none available  The patient was given 1L of fluids in the Emergency Department and 4 mg Morphine. Labs pending. Pt has not been short of breath or having chest pains when not coughing since moved to back exam room.  The patients labs show the patient to have a UTI, her Troponin is negative,  her potassium is slightly low at 3.3 and her chest xray is negative. Patient given 40 mEq of PO Potassium. She admits that she is already feeling much better and still not having any chest discomfort and has not been coughing since I had last been in the room . Her  dental pain is feeling better as well and is now a 3/10.   Filed Vitals:   08/08/11 0144  BP: 104/52  Pulse: 87  Temp: 98 F (36.7 C)  Resp: 16    After fluids and pain medication the patients vital signs were rechecked by myself. Her temperature has reduced to 99.9 her pulse 98, O2 sats 98%. The patient continues to not have any  shortness of breath. She reiterates that her chest pain occurs only when she coughs but that she has not been coughing. She denies a medical history of DVTs. My suspicion for PE is low due to the patient having fever and chest pain only when coughing. She is no longer tachycardic after her fluids. I believe she was dehydrated and she admits to not drinking and eating as much today because of not feeling well. She still has a little bit of dizziness but it has gotten much better after 1 L fluids.   Pt has been advised of the  symptoms that warrant their return to the ED. Patient has voiced understanding and has agreed to follow-up with the PCP or specialist.  UPDATE: 8:48 PM:  I have called the patients home to check up on the patient and make sure she is feeling better. She has not had a temperature since being discharged. She has had no more chest pain, shortness of breath or dizziness. She has not picked up any of her medications yet but is going to get them first thing in the morning. She admits that she is feeling much better than last night.   I have advised that if her symptoms continue return, change or worsen, she needs to return to the Emergency Department right away. She has also been advised that she needs to follow-up with her Primary Care Doctor this week for re-evaluation.     Dorthula Matas, PA 08/08/11 2120

## 2011-08-11 NOTE — ED Provider Notes (Signed)
Medical screening examination/treatment/procedure(s) were performed by non-physician practitioner and as supervising physician I was immediately available for consultation/collaboration.    Vida Roller, MD 08/11/11 [phone]

## 2011-10-09 ENCOUNTER — Encounter (HOSPITAL_COMMUNITY): Payer: Self-pay | Admitting: *Deleted

## 2011-10-09 ENCOUNTER — Inpatient Hospital Stay (HOSPITAL_COMMUNITY)
Admission: AD | Admit: 2011-10-09 | Discharge: 2011-10-09 | Disposition: A | Discharge status: Home / Self Care | Payer: Medicaid Other | Source: Ambulatory Visit | Attending: Obstetrics | Admitting: Obstetrics

## 2011-10-09 DIAGNOSIS — Z3201 Encounter for pregnancy test, result positive: Secondary | ICD-10-CM | POA: Insufficient documentation

## 2011-10-09 LAB — URINALYSIS, ROUTINE W REFLEX MICROSCOPIC
Hgb urine dipstick: NEGATIVE
Leukocytes, UA: NEGATIVE
Specific Gravity, Urine: 1.02 (ref 1.005–1.030)
Urobilinogen, UA: 0.2 mg/dL (ref 0.0–1.0)

## 2011-10-09 LAB — WET PREP, GENITAL
Trich, Wet Prep: NONE SEEN
Yeast Wet Prep HPF POC: NONE SEEN

## 2011-10-09 NOTE — MAU Note (Signed)
Pt states that she thinks she is pregnant. Wants a pregnancy test.

## 2011-10-09 NOTE — MAU Provider Note (Signed)
  History     CSN: [id number]  Arrival date and time: 10/09/11 1433   First Provider Initiated Contact with Patient 10/09/11 1507      Chief Complaint  Patient presents with  . Possible Pregnancy   HPI  Pt presents for pregnancy verification.  She denies spotting/bleeding or cramping.  Her last pregnancy was terminated. Pt states that she desires to terminate this pregnancy as well.  She was taking birth control pills and missed a couple.  Pt also states that she was previously diagnosed with a bladder infection and was given a prescription for antibiotic that she did not take; she thinks she still has the urinary tract infection.  Past Medical History  Diagnosis Date  . Hypertension     Past Surgical History  Procedure Date  . 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    Removal  . Cesarean section     Family History  Problem Relation Age of Onset  . Diabetes Mother   . Diabetes Maternal Grandmother   . Hypertension Maternal Grandmother   . Cancer Maternal Grandmother     colon cancer, brain tumour    History  Substance Use Topics  . Smoking status: Former Games developer  . Smokeless tobacco: Not on file  . Alcohol Use: No    Allergies: No Known Allergies  No prescriptions prior to admission    Review of Systems  Gastrointestinal: Positive for nausea. Negative for vomiting, abdominal pain, diarrhea and constipation.  Genitourinary: Negative for dysuria and urgency.   Physical Exam   Blood pressure 135/75, pulse 93, temperature 97.8 F (36.6 C), temperature source Oral, resp. rate 18, last menstrual period 08/20/2011, not currently breastfeeding.  Physical Exam  Nursing note and vitals reviewed. Constitutional: She is oriented to person, place, and time. She appears well-developed and well-nourished.  HENT:  Head: Normocephalic.  Eyes: Pupils are equal, round, and reactive to light.  Neck: Normal range of motion.  Cardiovascular: Normal rate.   Respiratory: Effort  normal.  GI: Soft. She exhibits no distension. There is no tenderness. There is no rebound and no guarding.  Genitourinary: Vagina normal.       Uterus retroverted ~6-8 week size nontender; adnexa without palpable enlargement or tenderness  Musculoskeletal: Normal range of motion.  Neurological: She is alert and oriented to person, place, and time.  Skin: Skin is warm and dry.  Psychiatric: She has a normal mood and affect.    MAU Course  Procedures Results for orders placed during the hospital encounter of 10/09/11 (from the past 24 hour(s))  POCT PREGNANCY, URINE     Status: Abnormal   Collection Time   10/09/11  2:48 PM      Component Value Range   Preg Test, Ur POSITIVE (*) NEGATIVE  WET PREP, GENITAL     Status: Abnormal   Collection Time   10/09/11  3:25 PM      Component Value Range   Yeast Wet Prep HPF POC NONE SEEN  NONE SEEN   Trich, Wet Prep NONE SEEN  NONE SEEN   Clue Cells Wet Prep HPF POC FEW (*) NONE SEEN   WBC, Wet Prep HPF POC FEW (*) NONE SEEN  urinalysis pending- pt anxious to leave-will contact pt if treatment needed-  Results reviewed when available and urine within normal limits  Assessment and Plan  First trimester pregnancy- pt elects to terminate pregnancy  Mehki Klumpp 10/09/2011, 3:09 PM

## 2011-10-10 LAB — GC/CHLAMYDIA PROBE AMP, GENITAL: GC Probe Amp, Genital: NEGATIVE

## 2011-10-10 NOTE — MAU Provider Note (Signed)
Agree with above note.  Sharaine Delange 10/10/2011 7:19 AM   

## 2011-10-12 ENCOUNTER — Inpatient Hospital Stay (HOSPITAL_COMMUNITY): Payer: Medicaid Other

## 2011-10-12 ENCOUNTER — Encounter (HOSPITAL_COMMUNITY): Payer: Self-pay

## 2011-10-12 ENCOUNTER — Inpatient Hospital Stay (HOSPITAL_COMMUNITY)
Admission: AD | Admit: 2011-10-12 | Discharge: 2011-10-12 | Disposition: A | Discharge status: Home / Self Care | Payer: Medicaid Other | Source: Ambulatory Visit | Attending: Obstetrics & Gynecology | Admitting: Obstetrics & Gynecology

## 2011-10-12 DIAGNOSIS — R109 Unspecified abdominal pain: Secondary | ICD-10-CM | POA: Insufficient documentation

## 2011-10-12 DIAGNOSIS — Z1389 Encounter for screening for other disorder: Secondary | ICD-10-CM

## 2011-10-12 DIAGNOSIS — Z3490 Encounter for supervision of normal pregnancy, unspecified, unspecified trimester: Secondary | ICD-10-CM

## 2011-10-12 DIAGNOSIS — O34599 Maternal care for other abnormalities of gravid uterus, unspecified trimester: Secondary | ICD-10-CM | POA: Insufficient documentation

## 2011-10-12 DIAGNOSIS — N83209 Unspecified ovarian cyst, unspecified side: Secondary | ICD-10-CM | POA: Insufficient documentation

## 2011-10-12 LAB — CBC WITH DIFFERENTIAL/PLATELET
Eosinophils Absolute: 0 10*3/uL (ref 0.0–0.7)
Eosinophils Relative: 0 % (ref 0–5)
HCT: 34.3 % — ABNORMAL LOW (ref 36.0–46.0)
Hemoglobin: 11.4 g/dL — ABNORMAL LOW (ref 12.0–15.0)
Lymphocytes Relative: 34 % (ref 12–46)
Lymphs Abs: 2.2 10*3/uL (ref 0.7–4.0)
MCH: 28.3 pg (ref 26.0–34.0)
MCV: 85.1 fL (ref 78.0–100.0)
Monocytes Absolute: 0.5 10*3/uL (ref 0.1–1.0)
Monocytes Relative: 7 % (ref 3–12)
RBC: 4.03 MIL/uL (ref 3.87–5.11)

## 2011-10-12 LAB — URINALYSIS, ROUTINE W REFLEX MICROSCOPIC
Bilirubin Urine: NEGATIVE
Glucose, UA: NEGATIVE mg/dL
Hgb urine dipstick: NEGATIVE
Specific Gravity, Urine: 1.02 (ref 1.005–1.030)
Urobilinogen, UA: 0.2 mg/dL (ref 0.0–1.0)
pH: 6 (ref 5.0–8.0)

## 2011-10-12 LAB — ABO/RH: ABO/RH(D): O POS

## 2011-10-12 LAB — HCG, QUANTITATIVE, PREGNANCY: hCG, Beta Chain, Quant, S: 85855 m[IU]/mL — ABNORMAL HIGH (ref <5)

## 2011-10-12 NOTE — MAU Provider Note (Signed)
[name] is a 21 y.o. female who presents to MAU for lower abdominal cramping. She was evaluated in MAU 10/09/11 for pregnancy verification. At that visit she had a pelvic exam with cultures for GC and Chlamydia and wet prep. She returns today after she began having cramping.   ROS: As stated in HPI  Exam: BP 111/66  Temp 98.4 F (36.9 C) (Oral)  Resp 18  Ht 5\' 4"  (1.626 m)  SpO2 100%  LMP 08/20/2011  Breastfeeding? Unknown  Patient is alert and oriented, in no acute distress. She appears comfortable at this time.  Results for orders placed during the hospital encounter of 10/12/11 (from the past 24 hour(s))  URINALYSIS, ROUTINE W REFLEX MICROSCOPIC     Status: Abnormal   Collection Time   10/12/11  3:55 PM      Component Value Range   Color, Urine YELLOW  YELLOW   APPearance HAZY (*) CLEAR   Specific Gravity, Urine 1.020  1.005 - 1.030   pH 6.0  5.0 - 8.0   Glucose, UA NEGATIVE  NEGATIVE mg/dL   Hgb urine dipstick NEGATIVE  NEGATIVE   Bilirubin Urine NEGATIVE  NEGATIVE   Ketones, ur NEGATIVE  NEGATIVE mg/dL   Protein, ur NEGATIVE  NEGATIVE mg/dL   Urobilinogen, UA 0.2  0.0 - 1.0 mg/dL   Nitrite NEGATIVE  NEGATIVE   Leukocytes, UA NEGATIVE  NEGATIVE  ABO/RH     Status: Normal   Collection Time   10/12/11  4:27 PM      Component Value Range   ABO/RH(D) O POS    CBC WITH DIFFERENTIAL     Status: Abnormal   Collection Time   10/12/11  4:28 PM      Component Value Range   WBC 6.6  4.0 - 10.5 K/uL   RBC 4.03  3.87 - 5.11 MIL/uL   Hemoglobin 11.4 (*) 12.0 - 15.0 g/dL   HCT 40.9 (*) 81.1 - 91.4 %   MCV 85.1  78.0 - 100.0 fL   MCH 28.3  26.0 - 34.0 pg   MCHC 33.2  30.0 - 36.0 g/dL   RDW 78.2  95.6 - 21.3 %   Platelets 286  150 - 400 K/uL   Neutrophils Relative 59  43 - 77 %   Neutro Abs 3.9  1.7 - 7.7 K/uL   Lymphocytes Relative 34  12 - 46 %   Lymphs Abs 2.2  0.7 - 4.0 K/uL   Monocytes Relative 7  3 - 12 %   Monocytes Absolute 0.5  0.1 - 1.0 K/uL   Eosinophils Relative 0   0 - 5 %   Eosinophils Absolute 0.0  0.0 - 0.7 K/uL   Basophils Relative 0  0 - 1 %   Basophils Absolute 0.0  0.0 - 0.1 K/uL  HCG, QUANTITATIVE, PREGNANCY     Status: Abnormal   Collection Time   10/12/11  4:28 PM      Component Value Range   hCG, Beta Chain, Quant, S 08657 (*) <5 mIU/mL   US Ob Comp Less 14 Wks  10/12/2011  *RADIOLOGY REPORT*  Clinical Data: Bilateral pelvic pain and vaginal spotting.  7 weeks and 4 days pregnant by last menstrual period.  OBSTETRIC <14 WK ULTRASOUND  Technique:  Transabdominal ultrasound was performed for evaluation of the gestation as well as the maternal uterus and adnexal regions.  Comparison:  Previous examinations.  Intrauterine gestational sac: Visualized/normal in shape. Yolk sac: Visualized/normal in shape. Embryo:  Visualized. Cardiac Activity: Visualized. Heart Rate: 174 bpm CRL:  13.6 mm  7w  5d         [place] EDC: 05/25/2012  Maternal uterus/Adnexae: 3.0 x 3.0 x 2.6 cm right ovarian cyst containing medium echotexture material without internal blood flow with power Doppler.  Normal appearing maternal left ovary.  No subchorionic hemorrhage seen. No free peritoneal fluid.  IMPRESSION:  1.  Single live intrauterine gestation with an estimated gestational age of [redacted] weeks and 5 days. 2.  No subchorionic hemorrhage. 3.  3.0 cm probable hemorrhagic right ovarian cyst.  Original Report Authenticated By: Darrol Angel, M.D.   Discussed with the patient the results of the ultrasound. Patient request information regarding Abortion possibility of getting an abortion here. Discussed with the patient Clinics available in the the [place] area. The patient has twins and currently has limited support.  Assessment: IUP   Right ovarian cyst  Plan:  Patient to make decision concerning pregnancy termination or prenatal care.   Return as needed.

## 2011-10-12 NOTE — MAU Note (Signed)
Patient states she has vaginal spotting and pain on both sides that started this am. Patient does not have a pad on.

## 2011-10-13 NOTE — MAU Provider Note (Signed)
Attestation of Attending Supervision of Advanced Practitioner (CNM/NP): Evaluation and management procedures were performed by the Advanced Practitioner under my supervision and collaboration.  I have reviewed the Advanced Practitioner's note and chart, and I agree with the management and plan.  Khamani Daniely, M.D. 10/13/2011 7:36 AM  

## 2011-12-31 ENCOUNTER — Encounter (HOSPITAL_COMMUNITY): Payer: Self-pay | Admitting: *Deleted

## 2011-12-31 ENCOUNTER — Emergency Department (HOSPITAL_COMMUNITY)
Admission: EM | Admit: 2011-12-31 | Discharge: 2011-12-31 | Disposition: A | Discharge status: Home / Self Care | Payer: Self-pay | Attending: Emergency Medicine | Admitting: Emergency Medicine

## 2011-12-31 DIAGNOSIS — G43909 Migraine, unspecified, not intractable, without status migrainosus: Secondary | ICD-10-CM

## 2011-12-31 DIAGNOSIS — I10 Essential (primary) hypertension: Secondary | ICD-10-CM | POA: Insufficient documentation

## 2011-12-31 DIAGNOSIS — R51 Headache: Secondary | ICD-10-CM | POA: Insufficient documentation

## 2011-12-31 DIAGNOSIS — Z800 Family history of malignant neoplasm of digestive organs: Secondary | ICD-10-CM | POA: Insufficient documentation

## 2011-12-31 DIAGNOSIS — Z87891 Personal history of nicotine dependence: Secondary | ICD-10-CM | POA: Insufficient documentation

## 2011-12-31 DIAGNOSIS — Z833 Family history of diabetes mellitus: Secondary | ICD-10-CM | POA: Insufficient documentation

## 2011-12-31 DIAGNOSIS — Z8249 Family history of ischemic heart disease and other diseases of the circulatory system: Secondary | ICD-10-CM | POA: Insufficient documentation

## 2011-12-31 DIAGNOSIS — H5710 Ocular pain, unspecified eye: Secondary | ICD-10-CM | POA: Insufficient documentation

## 2011-12-31 MED ORDER — TETRACAINE HCL 0.5 % OP SOLN
2.0000 [drp] | Freq: Once | OPHTHALMIC | Status: AC
  Administered 2011-12-31: 2 [drp] via OPHTHALMIC
  Filled 2011-12-31: qty 2

## 2011-12-31 MED ORDER — IBUPROFEN 600 MG PO TABS: 600.0000 mg | ORAL_TABLET | Freq: Four times a day (QID) | ORAL | Status: DC | PRN

## 2011-12-31 NOTE — ED Provider Notes (Signed)
History  This chart was scribed for Derwood Kaplan, MD by Ardeen Jourdain. This patient was seen in room [facility] and the patient's care was started at 2028.  CSN: [id number]  Arrival date & time 12/31/11  7829   First MD Initiated Contact with Patient 12/31/11 2028      Chief Complaint  Patient presents with  . Eye Pain     The history is provided by the patient. No language interpreter was used.   [name] is a 21 y.o. female who presents to the Emergency Department complaining of pain around her left eye. She states that there is swelling outside of the eye that started Saturday. She also states that she has blurred vision as well as headaches as associated symptoms. She denies drainage and other visual complaints. She states that the headache is constant throbbing, that is worsened by different positions, but denies photophobia. She has a family history of migraines, as well as a personal history of HTN. Pt is a former smoker but denies alcohol use.   Past Medical History  Diagnosis Date  . Hypertension     Past Surgical History  Procedure Date  . 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    Removal  . Cesarean section     Family History  Problem Relation Age of Onset  . Diabetes Mother   . Diabetes Maternal Grandmother   . Hypertension Maternal Grandmother   . Cancer Maternal Grandmother     colon cancer, brain tumour    History  Substance Use Topics  . Smoking status: Former Games developer  . Smokeless tobacco: Not on file  . Alcohol Use: No    OB History    Grav Para Term Preterm Abortions TAB SAB Ect Mult Living   4 2   1 1   1 2       Review of Systems  A complete 10 system review of systems was obtained and all systems are negative except as noted in the HPI and PMH.    Allergies  Review of patient's allergies indicates no known allergies.  Home Medications  No current outpatient prescriptions on file.  Triage Vitals: BP 124/57  Pulse 85  Temp 98.4  F (36.9 C) (Oral)  Resp 18  SpO2 100%  LMP 11/29/2011  Breastfeeding? No  Physical Exam  Nursing note and vitals reviewed. Constitutional: She is oriented to person, place, and time. She appears well-developed and well-nourished. No distress.  HENT:  Head: Normocephalic and atraumatic.  Eyes: Conjunctivae normal and EOM are normal. Pupils are equal, round, and reactive to light.       Pupils 3mm, no nystagmus   Neck: Neck supple. No tracheal deviation present.  Cardiovascular: Normal rate, regular rhythm and normal heart sounds.   Pulmonary/Chest: Effort normal and breath sounds normal. No respiratory distress.  Abdominal: Soft.  Musculoskeletal: Normal range of motion.  Neurological: She is alert and oriented to person, place, and time.       Cranial nerve 2-12 intact  Skin: Skin is warm and dry.  Psychiatric: She has a normal mood and affect. Her behavior is normal.    ED Course  Procedures (including critical care time) DIAGNOSTIC STUDIES: Oxygen Saturation is 100*% on room air, normal by my interpretation.    COORDINATION OF CARE: 2205- Discussed treatment plan with pt at bedside and pt agreed to plan.     Labs Reviewed  PREGNANCY, URINE   No results found.   No diagnosis found.    MDM  Medical screening examination/treatment/procedure(s) were performed by me as the supervising physician. Scribe service was utilized for documentation only.  DDX includes: Primary headaches - including migrainous headaches, cluster headaches, tension headaches. ICH Carotid dissection Cavernous sinus thrombosis Meningitis Encephalitis Sinusitis Tumor Vascular headaches AV malformation Brain aneurysm Muscular headaches  A/P: Pt comes in with cc of headaches. Headache free right now, but she has long lasting headaches, unilateral, throbbing with associated left eye blurry vision. She has family hx of migraine as well. No concerns for life threatening secondary  headaches because there is no nausea, vomiting, seizures, altered mental status, loss of consciousness, new weakness, or numbness, no gait instability. Will d.c, and request pcp follow up. NO NYSTAGMUS, PUPILLARY DEFECT TO BE CONCERNED FOR MS.       Derwood Kaplan, MD 12/31/11 2227

## 2011-12-31 NOTE — ED Notes (Signed)
Patient is alert and oriented x4.  Patient was explained discharge instructions and did not have any questions.  Patient's mom is coming to transport patient home.

## 2011-12-31 NOTE — ED Notes (Signed)
Patient advised she began to experience eye pain and swelling on Saturday.  Patient says her pain is 0 unless she touches her eye.  Also, when she bends over, she feels "pressure".  Patient asks if we can check a pregnancy test while she is here.

## 2011-12-31 NOTE — ED Notes (Signed)
Pain around her lt eye since Saturday no known injury.  No rash no bruising

## 2012-03-13 ENCOUNTER — Inpatient Hospital Stay (HOSPITAL_COMMUNITY)
Admission: AD | Admit: 2012-03-13 | Discharge: 2012-03-13 | Disposition: A | Discharge status: Home / Self Care | Payer: Medicaid Other | Source: Ambulatory Visit | Attending: Obstetrics | Admitting: Obstetrics

## 2012-03-13 ENCOUNTER — Encounter (HOSPITAL_COMMUNITY): Payer: Self-pay | Admitting: *Deleted

## 2012-03-13 DIAGNOSIS — N926 Irregular menstruation, unspecified: Secondary | ICD-10-CM | POA: Insufficient documentation

## 2012-03-13 DIAGNOSIS — Z3202 Encounter for pregnancy test, result negative: Secondary | ICD-10-CM | POA: Insufficient documentation

## 2012-03-13 HISTORY — DX: Urinary tract infection, site not specified: N39.0

## 2012-03-13 LAB — POCT PREGNANCY, URINE: Preg Test, Ur: NEGATIVE

## 2012-03-13 NOTE — MAU Provider Note (Signed)
  History     CSN: [id number]  Arrival date and time: 03/13/12 1324   First Provider Initiated Contact with Patient 03/13/12 1441      Chief Complaint  Patient presents with  . Possible Pregnancy   HPI This is a 21 y.o. female who presents requesting a pregnancy test. States had two periods in November, but had missed several OCPs.  Went to [place] office, but since her Medicaid ran out, they told her to come here. Has no complaints.  Declines physical exam.  OB History    Grav Para Term Preterm Abortions TAB SAB Ect Mult Living   3 1  1 2 1 1  1 2       Past Medical History  Diagnosis Date  . Hypertension   . Preterm labor   . Urinary tract infection     Past Surgical History  Procedure Date  . Cesarean section   . Dilation and curettage of uterus   . Therapeutic abortion   . 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    Removal;right    Family History  Problem Relation Age of Onset  . Diabetes Mother   . Diabetes Maternal Grandmother   . Hypertension Maternal Grandmother   . Cancer Maternal Grandmother     colon cancer, brain tumour  . Other Neg Hx     History  Substance Use Topics  . Smoking status: Former Games developer  . Smokeless tobacco: Never Used  . Alcohol Use: No    Allergies: No Known Allergies  No prescriptions prior to admission    ROS See HPI  Physical Exam   Temperature 98.1 F (36.7 C), temperature source Oral, resp. rate 18, last menstrual period 02/29/2012, not currently breastfeeding.  Physical Exam  Constitutional: She is oriented to person, place, and time. She appears well-developed and well-nourished. No distress.  HENT:  Head: Normocephalic.  Cardiovascular: Normal rate.   Respiratory: Effort normal.  Neurological: She is alert and oriented to person, place, and time.  Skin: Skin is warm and dry.  Psychiatric: She has a normal mood and affect.  Refuses pelvic exam  MAU Course  Procedures  MDM Discussed with patient who  declines exam.   Results for orders placed during the hospital encounter of 03/13/12 (from the past 72 hour(s))  POCT PREGNANCY, URINE     Status: Normal   Collection Time   03/13/12  1:53 PM      Component Value Range Comment   Preg Test, Ur NEGATIVE  NEGATIVE      Assessment and Plan  A:  Not pregnant      Irregular cycles due to missing pills  P:  Discharge       COntinue pills as ordered      Requests restart of DepoProvera _  May go to [facility] or H Dept  [facility] 03/13/2012, 2:46 PM

## 2012-03-13 NOTE — ED Notes (Signed)
M.williams, CNM discussed negative results. Pt satified and had no other complaints.

## 2012-03-13 NOTE — MAU Note (Signed)
Had 2 periods in [place].  Thought might preg  Or might be because of birth control pills- she missed a couple.

## 2012-03-26 ENCOUNTER — Inpatient Hospital Stay (HOSPITAL_COMMUNITY)
Admission: AD | Admit: 2012-03-26 | Discharge: 2012-03-26 | Disposition: A | Discharge status: Home / Self Care | Payer: Self-pay | Source: Ambulatory Visit | Attending: Obstetrics & Gynecology | Admitting: Obstetrics & Gynecology

## 2012-03-26 DIAGNOSIS — N926 Irregular menstruation, unspecified: Secondary | ICD-10-CM

## 2012-03-26 DIAGNOSIS — N938 Other specified abnormal uterine and vaginal bleeding: Secondary | ICD-10-CM | POA: Insufficient documentation

## 2012-03-26 DIAGNOSIS — N949 Unspecified condition associated with female genital organs and menstrual cycle: Secondary | ICD-10-CM | POA: Insufficient documentation

## 2012-03-26 LAB — POCT PREGNANCY, URINE: Preg Test, Ur: NEGATIVE

## 2012-03-26 NOTE — MAU Provider Note (Signed)
CC: Possible Pregnancy     HPI [name] is a 21 y.o. [id number] here for a pregnancy test.  Missed some of her Yasmin OCPs in November and had "2 periods" in Nov. Seen here 03/13/12 and had neg UPT. Continued on the pill pack and was due to menstruate 03/21/12 but has not. Would like to switch to Depo and plans to F/U for that at [facility]. If unable to be seen there will F/U at [facility]. Denies pain. Declines exam.  Past Medical History  Diagnosis Date  . Hypertension   . Preterm labor   . Urinary tract infection     OB History    Grav Para Term Preterm Abortions TAB SAB Ect Mult Living   3 1  1 2 1 1  1 2      # Outc Date GA Lbr Len/2nd Wgt Sex Del Anes PTL Lv   1A PRE 2/12     LTCS  Yes    Comments: PIH   1B  2/12       Yes    2 TAB            3 SAB               Past Surgical History  Procedure Date  . Cesarean section   . Dilation and curettage of uterus   . Therapeutic abortion   . 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    Removal;right    History   Social History  . Marital Status: Single    Spouse Name: N/A    Number of Children: N/A  . Years of Education: N/A   Occupational History  . None    Social History Main Topics  . Smoking status: Former Games developer  . Smokeless tobacco: Never Used  . Alcohol Use: No  . Drug Use: No     Comment: daily , recently started 2 months prior  . Sexually Active: Yes    Birth Control/ Protection: Condom, Pill     Comment: used to smoke black and milds for a period of a few months   Other Topics Concern  . Not on file   Social History Narrative  . No narrative on file    No current facility-administered medications on file prior to encounter.   Current Outpatient Prescriptions on File Prior to Encounter  Medication Sig Dispense Refill  . ibuprofen (ADVIL,MOTRIN) 600 MG tablet Take 1 tablet (600 mg total) by mouth every 6 (six) hours as needed for pain.  30 tablet  0    No Known Allergies  ROS Pertinent items in  HPI  PHYSICAL EXAM Filed Vitals:   03/26/12 1601  BP: 116/78  Pulse: 96  Temp: 98.8 F (37.1 C)  Resp: 16   General: Well nourished, well developed female in no acute distress Cardiovascular: Normal rate Respiratory: Normal effort ALAB RESULTS Results for orders placed during the hospital encounter of 03/26/12 (from the past 24 hour(s))  POCT PREGNANCY, URINE     Status: Normal   Collection Time   03/26/12  3:49 PM      Component Value Range   Preg Test, Ur NEGATIVE  NEGATIVE  POCT PREGNANCY, URINE     Status: Normal   Collection Time   03/26/12  3:52 PM      Component Value Range   Preg Test, Ur NEGATIVE  NEGATIVE    I ASSESSMENT  1. Irregular bleeding   due to missed OCPs [id number]  PLAN Discharge home. See  AVS for patient education. Reviewed taking pills, back up condoms if misses pill. No sex for 2 weeks before getting Depo. Follow-up Information    Follow up with [facility]. Schedule an appointment as soon as possible for a visit in 2 weeks.   Contact information:   [address] [place] [address] [phone]          Medication List     As of 03/26/2012  4:11 PM    TAKE these medications         ibuprofen 600 MG tablet   Commonly known as: ADVIL,MOTRIN   Take 1 tablet (600 mg total) by mouth every 6 (six) hours as needed for pain.           Danae Orleans, CNM 03/26/2012 4:00 PM

## 2012-03-26 NOTE — MAU Note (Signed)
Pt okay to d/c to home per DPoe, CNM

## 2012-03-26 NOTE — MAU Note (Signed)
Pt states here for upt only, denies pain or abnormal vaginal discharge. Doesn't want further eval, only upt.

## 2012-04-26 ENCOUNTER — Emergency Department (HOSPITAL_COMMUNITY)
Admission: EM | Admit: 2012-04-26 | Discharge: 2012-04-27 | Disposition: A | Discharge status: Home / Self Care | Payer: Self-pay | Attending: Emergency Medicine | Admitting: Emergency Medicine

## 2012-04-26 ENCOUNTER — Encounter (HOSPITAL_COMMUNITY): Payer: Self-pay | Admitting: *Deleted

## 2012-04-26 ENCOUNTER — Emergency Department (HOSPITAL_COMMUNITY): Payer: Self-pay

## 2012-04-26 DIAGNOSIS — R109 Unspecified abdominal pain: Secondary | ICD-10-CM | POA: Insufficient documentation

## 2012-04-26 DIAGNOSIS — Z87440 Personal history of urinary (tract) infections: Secondary | ICD-10-CM | POA: Insufficient documentation

## 2012-04-26 DIAGNOSIS — Z87891 Personal history of nicotine dependence: Secondary | ICD-10-CM | POA: Insufficient documentation

## 2012-04-26 DIAGNOSIS — H6690 Otitis media, unspecified, unspecified ear: Secondary | ICD-10-CM | POA: Insufficient documentation

## 2012-04-26 DIAGNOSIS — R059 Cough, unspecified: Secondary | ICD-10-CM | POA: Insufficient documentation

## 2012-04-26 DIAGNOSIS — R300 Dysuria: Secondary | ICD-10-CM | POA: Insufficient documentation

## 2012-04-26 DIAGNOSIS — R05 Cough: Secondary | ICD-10-CM | POA: Insufficient documentation

## 2012-04-26 DIAGNOSIS — I10 Essential (primary) hypertension: Secondary | ICD-10-CM | POA: Insufficient documentation

## 2012-04-26 LAB — URINALYSIS, ROUTINE W REFLEX MICROSCOPIC
Bilirubin Urine: NEGATIVE
Glucose, UA: NEGATIVE mg/dL
Hgb urine dipstick: NEGATIVE
Protein, ur: NEGATIVE mg/dL
Specific Gravity, Urine: 1.022 (ref 1.005–1.030)
Urobilinogen, UA: 0.2 mg/dL (ref 0.0–1.0)

## 2012-04-26 MED ORDER — ANTIPYRINE-BENZOCAINE 5.4-1.4 % OT SOLN
3.0000 [drp] | Freq: Once | OTIC | Status: AC
  Administered 2012-04-26: 4 [drp] via OTIC
  Filled 2012-04-26: qty 10

## 2012-04-26 MED ORDER — AMOXICILLIN-POT CLAVULANATE 875-125 MG PO TABS: 1.0000 | ORAL_TABLET | Freq: Two times a day (BID) | ORAL | Status: DC

## 2012-04-26 MED ORDER — AMOXICILLIN-POT CLAVULANATE 875-125 MG PO TABS
1.0000 | ORAL_TABLET | Freq: Once | ORAL | Status: AC
  Administered 2012-04-27: 1 via ORAL
  Filled 2012-04-26: qty 1

## 2012-04-26 NOTE — ED Provider Notes (Signed)
History     CSN: [id number]  Arrival date & time 04/26/12  2042   First MD Initiated Contact with Patient 04/26/12 2112      Chief Complaint  Patient presents with  . Otalgia  . Cough    (Consider location/radiation/quality/duration/timing/severity/associated sxs/prior treatment) HPI Comments: For the past 3 day has had R ear pain, and cough.  Also bilateral lower abdominal pain with dysyria. Denies vaginal discharge, LMP 03/21/13 usually regular.   Patient is a 22 y.o. female presenting with ear pain and cough. The history is provided by the patient.  Otalgia This is a new problem. The current episode started more than 2 days ago. There is pain in the right ear. The problem occurs constantly. The problem has not changed since onset.There has been no fever. The pain is at a severity of 5/10. The pain is moderate. Associated symptoms include abdominal pain and cough. Pertinent negatives include no ear discharge, no headaches, no hearing loss, no rhinorrhea, no sore throat and no diarrhea.  Cough Associated symptoms include ear pain. Pertinent negatives include no chest pain, no headaches, no rhinorrhea, no sore throat and no myalgias.    Past Medical History  Diagnosis Date  . Hypertension   . Preterm labor   . Urinary tract infection     Past Surgical History  Procedure Date  . Cesarean section   . Dilation and curettage of uterus   . Therapeutic abortion   . 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    Removal;right    Family History  Problem Relation Age of Onset  . Diabetes Mother   . Diabetes Maternal Grandmother   . Hypertension Maternal Grandmother   . Cancer Maternal Grandmother     colon cancer, brain tumour  . Other Neg Hx     History  Substance Use Topics  . Smoking status: Former Games developer  . Smokeless tobacco: Never Used  . Alcohol Use: No    OB History    Grav Para Term Preterm Abortions TAB SAB Ect Mult Living   3 1  1 2 1 1  1 2       Review of  Systems  Constitutional: Negative for fever.  HENT: Positive for ear pain. Negative for hearing loss, congestion, sore throat, rhinorrhea and ear discharge.   Respiratory: Positive for cough.   Cardiovascular: Negative for chest pain.  Gastrointestinal: Positive for abdominal pain. Negative for nausea and diarrhea.  Genitourinary: Positive for dysuria. Negative for frequency, vaginal bleeding and vaginal discharge.  Musculoskeletal: Negative for myalgias.  Skin: Negative for wound.  Neurological: Negative for headaches.    Allergies  Review of patient's allergies indicates no known allergies.  Home Medications   Current Outpatient Rx  Name  Route  Sig  Dispense  Refill  . AMOXICILLIN-POT CLAVULANATE 875-125 MG PO TABS   Oral   Take 1 tablet by mouth every 12 (twelve) hours.   14 tablet   0     BP 123/84  Pulse 105  Temp 97.5 F (36.4 C) (Oral)  Resp 20  SpO2 99%  LMP 03/21/2012  Physical Exam  Constitutional: She appears well-developed and well-nourished.  HENT:  Head: Normocephalic.  Right Ear: Ear canal normal. There is tenderness. No drainage or swelling. A middle ear effusion is present.  Left Ear: Tympanic membrane and external ear normal. Decreased hearing is noted.  Nose: Nose normal.  Mouth/Throat: Uvula is midline and oropharynx is clear and moist. No oropharyngeal exudate.  Eyes: Pupils are equal, round,  and reactive to light.  Neck: Normal range of motion.  Cardiovascular: Tachycardia present.   Pulmonary/Chest: Effort normal and breath sounds normal. No respiratory distress. She has no wheezes.  Abdominal: Soft.  Musculoskeletal: Normal range of motion.  Lymphadenopathy:    She has no cervical adenopathy.  Neurological: She is alert.  Skin: Skin is warm. No erythema.    ED Course  Procedures (including critical care time)   Labs Reviewed  URINALYSIS, ROUTINE W REFLEX MICROSCOPIC   Dg Chest 2 View  04/26/2012  *RADIOLOGY REPORT*  Clinical  Data: 22 year old female with cough and chest pain.  CHEST - 2 VIEW  Comparison: 08/07/2011 chest radiograph  Findings: The cardiomediastinal silhouette is unremarkable. Mild peribronchial thickening is unchanged. There is no evidence of focal airspace disease, pulmonary edema, suspicious pulmonary nodule/mass, pleural effusion, or pneumothorax. No acute bony abnormalities are identified.  IMPRESSION: No evidence of acute cardiopulmonary disease.   Original Report Authenticated By: Harmon Pier, M.D.      1. Otitis       MDM   Chest xray negative will treat OM with Augmentin        Arman Filter, NP 04/26/12 2349

## 2012-04-26 NOTE — ED Notes (Signed)
Rt. Ear ache. Productive cough and substernal chest wall pain with cough. Green sputum.? UTI

## 2012-04-28 NOTE — ED Provider Notes (Signed)
Medical screening examination/treatment/procedure(s) were performed by non-physician practitioner and as supervising physician I was immediately available for consultation/collaboration.  Christopher J. Pollina, MD 04/28/12 0016 

## 2013-06-12 ENCOUNTER — Encounter (HOSPITAL_COMMUNITY): Payer: Self-pay | Admitting: Emergency Medicine

## 2013-06-12 ENCOUNTER — Emergency Department (HOSPITAL_COMMUNITY)
Admission: EM | Admit: 2013-06-12 | Discharge: 2013-06-12 | Disposition: A | Discharge status: Home / Self Care | Payer: Self-pay | Attending: Emergency Medicine | Admitting: Emergency Medicine

## 2013-06-12 DIAGNOSIS — Z87440 Personal history of urinary (tract) infections: Secondary | ICD-10-CM | POA: Insufficient documentation

## 2013-06-12 DIAGNOSIS — Z87891 Personal history of nicotine dependence: Secondary | ICD-10-CM | POA: Insufficient documentation

## 2013-06-12 DIAGNOSIS — H5710 Ocular pain, unspecified eye: Secondary | ICD-10-CM | POA: Insufficient documentation

## 2013-06-12 DIAGNOSIS — R51 Headache: Secondary | ICD-10-CM | POA: Insufficient documentation

## 2013-06-12 DIAGNOSIS — I10 Essential (primary) hypertension: Secondary | ICD-10-CM | POA: Insufficient documentation

## 2013-06-12 DIAGNOSIS — R519 Headache, unspecified: Secondary | ICD-10-CM

## 2013-06-12 MED ORDER — ACETAMINOPHEN 325 MG PO TABS
650.0000 mg | ORAL_TABLET | Freq: Once | ORAL | Status: AC
  Administered 2013-06-12: 650 mg via ORAL
  Filled 2013-06-12: qty 2

## 2013-06-12 MED ORDER — KETOROLAC TROMETHAMINE 30 MG/ML IJ SOLN
30.0000 mg | Freq: Once | INTRAMUSCULAR | Status: AC
  Administered 2013-06-12: 30 mg via INTRAMUSCULAR
  Filled 2013-06-12: qty 1

## 2013-06-12 NOTE — Discharge Instructions (Signed)
Is safe to take Tylenol, BC powder, Advil, Naprosyn, Motrin for headache.  Take this at the start of the headache with a big glass of water.  If he develops new or worsening symptoms associated with your headache.  Didn't seek further medical treatment

## 2013-06-12 NOTE — ED Notes (Signed)
The pt has had a headache for 3 days no nv or diarrhea.  She has not taklen any med for the headache.  lmp feb 26th

## 2013-06-12 NOTE — ED Notes (Signed)
She took bp meds 2 years ago then stopped taking

## 2013-06-12 NOTE — ED Provider Notes (Signed)
CSN: [id number]     Arrival date & time 06/12/13  1858 History   First MD Initiated Contact with Patient 06/12/13 2020     Chief Complaint  Patient presents with  . Headache     (Consider location/radiation/quality/duration/timing/severity/associated sxs/prior Treatment) Patient is a 23 y.o. female presenting with headaches. The history is provided by the patient.  Headache Pain location:  Generalized Radiates to:  Does not radiate Severity currently:  9/10 Severity at highest:  5/10 Onset quality:  Unable to specify Duration:  3 days Timing:  Constant Progression:  Unchanged Chronicity:  New Similar to prior headaches: yes   Relieved by:  None tried Worsened by:  Nothing tried Ineffective treatments:  None tried Associated symptoms: eye pain   Associated symptoms: no blurred vision, no congestion, no cough, no diarrhea, no ear pain, no fever, no numbness, no photophobia, no sinus pressure, no sore throat, no visual change and no vomiting     Past Medical History  Diagnosis Date  . Hypertension   . Preterm labor   . Urinary tract infection    Past Surgical History  Procedure Laterality Date  . Cesarean section    . Dilation and curettage of uterus    . Therapeutic abortion    . 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    Removal;right   Family History  Problem Relation Age of Onset  . Diabetes Mother   . Diabetes Maternal Grandmother   . Hypertension Maternal Grandmother   . Cancer Maternal Grandmother     colon cancer, brain tumour  . Other Neg Hx    History  Substance Use Topics  . Smoking status: Former Games developer  . Smokeless tobacco: Never Used  . Alcohol Use: No   OB History   Grav Para Term Preterm Abortions TAB SAB Ect Mult Living   3 1  1 2 1 1  1 2      Review of Systems  Constitutional: Negative for fever.  HENT: Negative for congestion, ear pain, facial swelling, sinus pressure and sore throat.   Eyes: Positive for pain. Negative for blurred vision  and photophobia.  Respiratory: Negative for cough.   Gastrointestinal: Negative for vomiting and diarrhea.  Genitourinary: Negative for dysuria and frequency.  Skin: Negative for rash and wound.  Neurological: Positive for headaches. Negative for numbness.  All other systems reviewed and are negative.      Allergies  Review of patient's allergies indicates no known allergies.  Home Medications  No current outpatient prescriptions on file. BP 103/53  Pulse 82  Temp(Src) 99.5 F (37.5 C) (Oral)  Resp 16  Ht 5\' 2"  (1.575 m)  Wt 212 lb (96.163 kg)  BMI 38.77 kg/m2  SpO2 100% Physical Exam  Nursing note and vitals reviewed. Constitutional: She is oriented to person, place, and time. She appears well-developed and well-nourished. No distress.  HENT:  Head: Normocephalic and atraumatic.  Mouth/Throat: Oropharynx is clear and moist.  Eyes: Pupils are equal, round, and reactive to light.  Patient states, that she moves her eyes to the right.  They are uncomfortable  Neck: Normal range of motion.  Cardiovascular: Normal rate.   Pulmonary/Chest: Effort normal.  Abdominal: Soft. She exhibits no distension.  Neurological: She is alert and oriented to person, place, and time.  Skin: Skin is warm and dry. No rash noted. No erythema.    ED Course  Procedures (including critical care time) Labs Review Labs Reviewed - No data to display Imaging Review No results found.  EKG Interpretation None      MDM  Patient was given IM Toradol, which made her headache go from an 11/09/2002.  She was given additional Tylenol, and discharged home Final diagnoses:  Headache         Arman FilterGail K Riaan [place]edo, NP 06/12/13 2153

## 2013-06-13 NOTE — ED Provider Notes (Signed)
Medical screening examination/treatment/procedure(s) were performed by non-physician practitioner and as supervising physician I was immediately available for consultation/collaboration.   EKG Interpretation None       Doug SouSam Nikya Busler, MD 06/13/13 [id number]

## 2013-06-15 ENCOUNTER — Inpatient Hospital Stay (HOSPITAL_COMMUNITY)
Admission: AD | Admit: 2013-06-15 | Discharge: 2013-06-15 | Discharge status: Against Medical Advice | Payer: Self-pay | Source: Ambulatory Visit | Attending: Obstetrics & Gynecology | Admitting: Obstetrics & Gynecology

## 2013-06-15 ENCOUNTER — Encounter (HOSPITAL_COMMUNITY): Payer: Self-pay | Admitting: *Deleted

## 2013-06-15 DIAGNOSIS — Z87891 Personal history of nicotine dependence: Secondary | ICD-10-CM | POA: Insufficient documentation

## 2013-06-15 DIAGNOSIS — I10 Essential (primary) hypertension: Secondary | ICD-10-CM | POA: Insufficient documentation

## 2013-06-15 DIAGNOSIS — R112 Nausea with vomiting, unspecified: Secondary | ICD-10-CM | POA: Insufficient documentation

## 2013-06-15 DIAGNOSIS — Z3202 Encounter for pregnancy test, result negative: Secondary | ICD-10-CM | POA: Insufficient documentation

## 2013-06-15 DIAGNOSIS — R197 Diarrhea, unspecified: Secondary | ICD-10-CM | POA: Insufficient documentation

## 2013-06-15 DIAGNOSIS — R51 Headache: Secondary | ICD-10-CM | POA: Insufficient documentation

## 2013-06-15 HISTORY — DX: Headache: R51

## 2013-06-15 LAB — URINALYSIS, ROUTINE W REFLEX MICROSCOPIC
BILIRUBIN URINE: NEGATIVE
Glucose, UA: NEGATIVE mg/dL
KETONES UR: NEGATIVE mg/dL
NITRITE: NEGATIVE
Protein, ur: NEGATIVE mg/dL
SPECIFIC GRAVITY, URINE: 1.015 (ref 1.005–1.030)
UROBILINOGEN UA: 0.2 mg/dL (ref 0.0–1.0)
pH: 6 (ref 5.0–8.0)

## 2013-06-15 LAB — URINE MICROSCOPIC-ADD ON

## 2013-06-15 LAB — POCT PREGNANCY, URINE: Preg Test, Ur: NEGATIVE

## 2013-06-15 NOTE — MAU Provider Note (Signed)
Chief Complaint: Emesis, Diarrhea and Dizziness   First Provider Initiated Contact with Patient 06/15/13 1813     SUBJECTIVE HPI: [name] is a 23 y.o. [id number] who presents to maternity admissions reporting onset of headache and dizziness 5 days ago, with onset of nausea and diarrhea today.  She reports constipation prior to onset of diarrhea, and drank prune juice, then started having diarrhea.  She denies vaginal itching/burning, urinary symptoms, current h/a, or fever/chills.     Past Medical History  Diagnosis Date  . Hypertension   . Preterm labor   . Urinary tract infection   . BJYNWGNF(621.3Headache(784.0)    Past Surgical History  Procedure Laterality Date  . Cesarean section    . Dilation and curettage of uterus    . Therapeutic abortion    . 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    Removal;right   History   Social History  . Marital Status: Single    Spouse Name: N/A    Number of Children: N/A  . Years of Education: N/A   Occupational History  . None    Social History Main Topics  . Smoking status: Former Smoker    Quit date: 01/15/2013  . Smokeless tobacco: Never Used  . Alcohol Use: No  . Drug Use: No     Comment: daily , recently started 2 months prior  . Sexual Activity: Yes    Birth Control/ Protection: Condom, Pill     Comment: used to smoke black and milds for a period of a few months   Other Topics Concern  . Not on file   Social History Narrative  . No narrative on file   No current facility-administered medications on file prior to encounter.   No current outpatient prescriptions on file prior to encounter.   No Known Allergies  ROS: Pertinent items in HPI  OBJECTIVE Blood pressure 111/68, pulse 95, temperature 98.6 F (37 C), temperature source Oral, resp. rate 18, height 5\' 2"  (1.575 m), weight 93.895 kg (207 lb), last menstrual period 05/14/2013. GENERAL: Well-developed, well-nourished female in no acute distress.  HEENT:  Normocephalic HEART: normal rate RESP: normal effort ABDOMEN: Soft, non-tender EXTREMITIES: Nontender, no edema NEURO: Alert and oriented SPECULUM EXAM: Deferred  LAB RESULTS Results for orders placed during the hospital encounter of 06/15/13 (from the past 24 hour(s))  URINALYSIS, ROUTINE W REFLEX MICROSCOPIC     Status: Abnormal   Collection Time    06/15/13  5:35 PM      Result Value Ref Range   Color, Urine YELLOW  YELLOW   APPearance HAZY (*) CLEAR   Specific Gravity, Urine 1.015  1.005 - 1.030   pH 6.0  5.0 - 8.0   Glucose, UA NEGATIVE  NEGATIVE mg/dL   Hgb urine dipstick SMALL (*) NEGATIVE   Bilirubin Urine NEGATIVE  NEGATIVE   Ketones, ur NEGATIVE  NEGATIVE mg/dL   Protein, ur NEGATIVE  NEGATIVE mg/dL   Urobilinogen, UA 0.2  0.0 - 1.0 mg/dL   Nitrite NEGATIVE  NEGATIVE   Leukocytes, UA MODERATE (*) NEGATIVE  URINE MICROSCOPIC-ADD ON     Status: Abnormal   Collection Time    06/15/13  5:35 PM      Result Value Ref Range   Squamous Epithelial / LPF FEW (*) RARE   WBC, UA 21-50  <3 WBC/hpf   RBC / HPF 0-2  <3 RBC/hpf   Bacteria, UA FEW (*) RARE  POCT PREGNANCY, URINE     Status: None  Collection Time    06/15/13  5:41 PM      Result Value Ref Range   Preg Test, Ur NEGATIVE  NEGATIVE    ASSESSMENT 1. Negative pregnancy test   2. Nausea vomiting and diarrhea     PLAN Discussed negative pregnancy test with pt and plan to do CBC to evaluate for infection.  Pt left AMA prior to treatment for symptoms.   She had 2 small children with her and had to leave reporting the children were hungry. Urine sent for culture    Sharen Counter Certified Nurse-Midwife 06/15/2013  8:48 PM

## 2013-06-15 NOTE — MAU Note (Signed)
C/O HA & dizziness, vomiting for last 5 days, diarrhea started this a.m.  Last period in February.  Denies bleeding.

## 2013-06-17 ENCOUNTER — Telehealth (HOSPITAL_COMMUNITY): Payer: Self-pay

## 2013-06-17 ENCOUNTER — Emergency Department (HOSPITAL_COMMUNITY)
Admission: EM | Admit: 2013-06-17 | Discharge: 2013-06-17 | Disposition: A | Discharge status: Home / Self Care | Payer: Self-pay | Attending: Emergency Medicine | Admitting: Emergency Medicine

## 2013-06-17 ENCOUNTER — Encounter (HOSPITAL_COMMUNITY): Payer: Self-pay | Admitting: Emergency Medicine

## 2013-06-17 DIAGNOSIS — N898 Other specified noninflammatory disorders of vagina: Secondary | ICD-10-CM | POA: Insufficient documentation

## 2013-06-17 DIAGNOSIS — I10 Essential (primary) hypertension: Secondary | ICD-10-CM | POA: Insufficient documentation

## 2013-06-17 DIAGNOSIS — R110 Nausea: Secondary | ICD-10-CM | POA: Insufficient documentation

## 2013-06-17 DIAGNOSIS — Z87891 Personal history of nicotine dependence: Secondary | ICD-10-CM | POA: Insufficient documentation

## 2013-06-17 DIAGNOSIS — Z3202 Encounter for pregnancy test, result negative: Secondary | ICD-10-CM | POA: Insufficient documentation

## 2013-06-17 DIAGNOSIS — N390 Urinary tract infection, site not specified: Secondary | ICD-10-CM | POA: Insufficient documentation

## 2013-06-17 LAB — URINALYSIS, ROUTINE W REFLEX MICROSCOPIC
Bilirubin Urine: NEGATIVE
GLUCOSE, UA: NEGATIVE mg/dL
Ketones, ur: NEGATIVE mg/dL
Nitrite: NEGATIVE
Protein, ur: NEGATIVE mg/dL
SPECIFIC GRAVITY, URINE: 1.011 (ref 1.005–1.030)
UROBILINOGEN UA: 0.2 mg/dL (ref 0.0–1.0)
pH: 5.5 (ref 5.0–8.0)

## 2013-06-17 LAB — URINE CULTURE

## 2013-06-17 LAB — CBC WITH DIFFERENTIAL/PLATELET
Basophils Absolute: 0.1 10*3/uL (ref 0.0–0.1)
Basophils Relative: 1 % (ref 0–1)
Eosinophils Absolute: 0 10*3/uL (ref 0.0–0.7)
Eosinophils Relative: 0 % (ref 0–5)
HCT: 38.8 % (ref 36.0–46.0)
Hemoglobin: 13.2 g/dL (ref 12.0–15.0)
LYMPHS ABS: 1.9 10*3/uL (ref 0.7–4.0)
Lymphocytes Relative: 36 % (ref 12–46)
MCH: 30.3 pg (ref 26.0–34.0)
MCHC: 34 g/dL (ref 30.0–36.0)
MCV: 89 fL (ref 78.0–100.0)
MONO ABS: 0.6 10*3/uL (ref 0.1–1.0)
Monocytes Relative: 12 % (ref 3–12)
Neutro Abs: 2.7 10*3/uL (ref 1.7–7.7)
Neutrophils Relative %: 51 % (ref 43–77)
PLATELETS: 215 10*3/uL (ref 150–400)
RBC: 4.36 MIL/uL (ref 3.87–5.11)
RDW: 12.4 % (ref 11.5–15.5)
WBC: 5.3 10*3/uL (ref 4.0–10.5)

## 2013-06-17 LAB — URINE MICROSCOPIC-ADD ON

## 2013-06-17 LAB — COMPREHENSIVE METABOLIC PANEL
ALT: 11 U/L (ref 0–35)
AST: 18 U/L (ref 0–37)
Albumin: 3.2 g/dL — ABNORMAL LOW (ref 3.5–5.2)
Alkaline Phosphatase: 84 U/L (ref 39–117)
BUN: 8 mg/dL (ref 6–23)
CALCIUM: 9 mg/dL (ref 8.4–10.5)
CO2: 28 mEq/L (ref 19–32)
CREATININE: 1.08 mg/dL (ref 0.50–1.10)
Chloride: 100 mEq/L (ref 96–112)
GFR calc Af Amer: 83 mL/min — ABNORMAL LOW (ref >90)
GFR calc non Af Amer: 71 mL/min — ABNORMAL LOW (ref >90)
Glucose, Bld: 93 mg/dL (ref 70–99)
Potassium: 3.6 mEq/L — ABNORMAL LOW (ref 3.7–5.3)
SODIUM: 140 meq/L (ref 137–147)
Total Bilirubin: 0.4 mg/dL (ref 0.3–1.2)
Total Protein: 7.5 g/dL (ref 6.0–8.3)

## 2013-06-17 LAB — LIPASE, BLOOD: Lipase: 17 U/L (ref 11–59)

## 2013-06-17 LAB — POC URINE PREG, ED: Preg Test, Ur: NEGATIVE

## 2013-06-17 MED ORDER — CEFTRIAXONE SODIUM 250 MG IJ SOLR
250.0000 mg | Freq: Once | INTRAMUSCULAR | Status: AC
  Administered 2013-06-17: 250 mg via INTRAMUSCULAR
  Filled 2013-06-17: qty 250

## 2013-06-17 MED ORDER — DOXYCYCLINE HYCLATE 100 MG PO CAPS: 100.0000 mg | ORAL_CAPSULE | Freq: Two times a day (BID) | ORAL | Status: DC

## 2013-06-17 MED ORDER — LIDOCAINE HCL (PF) 1 % IJ SOLN
INTRAMUSCULAR | Status: AC
  Filled 2013-06-17: qty 5

## 2013-06-17 NOTE — ED Provider Notes (Signed)
CSN: [id number]     Arrival date & time 06/17/13  1413 History   First MD Initiated Contact with Patient 06/17/13 2007     Chief Complaint  Patient presents with  . Abdominal Pain   HPI Comments: 23 yo F PMHx of HTN, migraines, presents with CC of abdominal pain.  Symptoms present for 1 week.  Crampy pain, bilateral upper quadrants, and suprapubic, with associated thick white vaginal discharge, and dysuria.  Occasional nausea. Denies fever, chills, CP, SOB, cough, vomiting, diarrhea, constipation, vaginal bleeding, hematuria, or any other symptoms.  Pt has taken no meds for pain.  Pt is sexually active, not concerned for STI as stable relationship with 1 partner.  Pt states she thought she had a UTI a month ago, but was never tested or treated.  LMP approximately 1 month ago.  No other complaints.    Patient is a 23 y.o. female presenting with abdominal pain. The history is provided by the patient. No language interpreter was used.  Abdominal Pain Pain location:  RUQ, LUQ and suprapubic Pain quality: cramping   Pain radiates to:  Does not radiate Pain severity:  Mild Onset quality:  Gradual Duration:  1 week Timing:  Intermittent Progression:  Unchanged Chronicity:  New Context: not alcohol use, not awakening from sleep, not diet changes, not eating, not laxative use, not medication withdrawal, not previous surgeries, not recent illness, not recent sexual activity, not recent travel, not retching, not sick contacts, not suspicious food intake and not trauma   Relieved by:  Nothing Worsened by:  Nothing tried Ineffective treatments:  None tried Associated symptoms: dysuria, nausea and vaginal discharge   Associated symptoms: no anorexia, no belching, no chest pain, no chills, no constipation, no cough, no diarrhea, no fatigue, no fever, no flatus, no hematemesis, no hematochezia, no hematuria, no melena, no shortness of breath, no sore throat, no vaginal bleeding and no vomiting   Vaginal  discharge:    Quality:  White   Severity:  Moderate   Onset quality:  Gradual   Duration:  1 week   Timing:  Constant   Progression:  Unchanged   Chronicity:  New Risk factors: no alcohol abuse, no aspirin use, not elderly, has not had multiple surgeries, no NSAID use, not obese, not pregnant and no recent hospitalization     Past Medical History  Diagnosis Date  . Hypertension   . Preterm labor   . Urinary tract infection   . [id number](478.2)    Past Surgical History  Procedure Laterality Date  . Cesarean section    . Dilation and curettage of uterus    . Therapeutic abortion    . 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    Removal;right   Family History  Problem Relation Age of Onset  . Diabetes Mother   . Diabetes Maternal Grandmother   . Hypertension Maternal Grandmother   . Cancer Maternal Grandmother     colon cancer, brain tumour  . Other Neg Hx    History  Substance Use Topics  . Smoking status: Former Smoker    Quit date: 01/15/2013  . Smokeless tobacco: Never Used  . Alcohol Use: No   OB History   Grav Para Term Preterm Abortions TAB SAB Ect Mult Living   3 1  1 2 1 1  1 2      Review of Systems  Constitutional: Negative for fever, chills and fatigue.  HENT: Negative for sore throat.   Respiratory: Negative for cough and shortness  of breath.   Cardiovascular: Negative for chest pain.  Gastrointestinal: Positive for nausea and abdominal pain. Negative for vomiting, diarrhea, constipation, melena, hematochezia, anorexia, flatus and hematemesis.  Genitourinary: Positive for dysuria and vaginal discharge. Negative for hematuria and vaginal bleeding.  Musculoskeletal: Negative for myalgias.  Skin: Negative for rash.  Neurological: Negative for dizziness, weakness, light-headedness, numbness and headaches.  Hematological: Negative for adenopathy. Does not bruise/bleed easily.  All other systems reviewed and are negative.      Allergies  Review of  patient's allergies indicates no known allergies.  Home Medications  No current outpatient prescriptions on file. BP 124/78  Pulse 84  Temp(Src) 98.8 F (37.1 C) (Oral)  Resp 14  SpO2 100%  LMP 06/01/2013 Physical Exam  Nursing note and vitals reviewed. Constitutional: She is oriented to person, place, and time. She appears well-developed and well-nourished.  HENT:  Head: Normocephalic and atraumatic.  Right Ear: External ear normal.  Left Ear: External ear normal.  Nose: Nose normal.  Mouth/Throat: Oropharynx is clear and moist.  Eyes: Conjunctivae and EOM are normal.  Neck: Normal range of motion. Neck supple.  Cardiovascular: Normal rate, regular rhythm, normal heart sounds and intact distal pulses.   Pulmonary/Chest: Effort normal and breath sounds normal. No respiratory distress. She has no wheezes. She has no rales. She exhibits no tenderness.  Abdominal: Soft. Bowel sounds are normal. She exhibits no distension and no mass. There is tenderness. There is no rebound and no guarding.  Soft, mild TTP suprapubic, no guarding, no rebound.   Musculoskeletal: Normal range of motion.  Neurological: She is alert and oriented to person, place, and time.  Skin: Skin is warm and dry.    ED Course  Procedures (including critical care time) Labs Review Labs Reviewed  COMPREHENSIVE METABOLIC PANEL - Abnormal; Notable for the following:    Potassium 3.6 (*)    Albumin 3.2 (*)    GFR calc non Af Amer 71 (*)    GFR calc Af Amer 83 (*)    All other components within normal limits  URINALYSIS, ROUTINE W REFLEX MICROSCOPIC - Abnormal; Notable for the following:    APPearance CLOUDY (*)    Hgb urine dipstick SMALL (*)    Leukocytes, UA MODERATE (*)    All other components within normal limits  URINE MICROSCOPIC-ADD ON - Abnormal; Notable for the following:    Squamous Epithelial / LPF MANY (*)    Bacteria, UA FEW (*)    All other components within normal limits  CBC WITH  DIFFERENTIAL  LIPASE, BLOOD  POC URINE PREG, ED   Imaging Review No results found.   EKG Interpretation None      MDM   Final diagnoses:  None   23 yo F PMHx of HTN, migraines, presents with CC of abdominal pain.  Filed Vitals:   06/17/13 2021  BP: 124/78  Pulse: 84  Temp:   Resp:    Physical exam as above.  VS WNL, afebrile.  Pt with mild abdominal pain on exam, but abdomen soft, nondistended, nonperitonitic.  Pt also experiencing dysuria, and moderate thick white vaginal discharge.  UA positive for moderate leukocytes, WBC 21-50.  CBC, CMP, Lipase are all WNL.  Pregnancy test negative.    Pt refuses pelvic exam at this time.  Given urinary and vaginal symptoms, and positive UA, will treat empirically for UTI, vaginitis/cervicitis.   Pt given Rocephin IM in ED.  Pt to be d/c home in good condition.  Encouraged to  continue supportive care.  Rx doxycycline.  F/u with PCP in 1 week.  Return precautions given.  Pt understands and agrees with plan.  I have discussed pt's care plan with Dr. Siri ColeZavtiz.  Jon GillsWebb, Christy Friede, MD      Jon GillsZach Ibeth Fahmy, MD 06/18/13 [phone]

## 2013-06-17 NOTE — ED Notes (Signed)
Pt presents with abd pain off and on for about a week now. States she is nauseated but denies any vomiting or diarrhea. States it is upper and lower. States she has noticed some vaginal discharge and painful urination as well.

## 2013-06-17 NOTE — Discharge Instructions (Signed)
Urinary Tract Infection  Urinary tract infections (UTIs) can develop anywhere along your urinary tract. Your urinary tract is your body's drainage system for removing wastes and extra water. Your urinary tract includes two kidneys, two ureters, a bladder, and a urethra. Your kidneys are a pair of bean-shaped organs. Each kidney is about the size of your fist. They are located below your ribs, one on each side of your spine.  CAUSES  Infections are caused by microbes, which are microscopic organisms, including fungi, viruses, and bacteria. These organisms are so small that they can only be seen through a microscope. Bacteria are the microbes that most commonly cause UTIs.  SYMPTOMS   Symptoms of UTIs may vary by age and gender of the patient and by the location of the infection. Symptoms in young women typically include a frequent and intense urge to urinate and a painful, burning feeling in the bladder or urethra during urination. Older women and men are more likely to be tired, shaky, and weak and have muscle aches and abdominal pain. A fever may mean the infection is in your kidneys. Other symptoms of a kidney infection include pain in your back or sides below the ribs, nausea, and vomiting.  DIAGNOSIS  To diagnose a UTI, your caregiver will ask you about your symptoms. Your caregiver also will ask to provide a urine sample. The urine sample will be tested for bacteria and white blood cells. White blood cells are made by your body to help fight infection.  TREATMENT   Typically, UTIs can be treated with medication. Because most UTIs are caused by a bacterial infection, they usually can be treated with the use of antibiotics. The choice of antibiotic and length of treatment depend on your symptoms and the type of bacteria causing your infection.  HOME CARE INSTRUCTIONS   If you were prescribed antibiotics, take them exactly as your caregiver instructs you. Finish the medication even if you feel better after you  have only taken some of the medication.   Drink enough water and fluids to keep your urine clear or pale yellow.   Avoid caffeine, tea, and carbonated beverages. They tend to irritate your bladder.   Empty your bladder often. Avoid holding urine for long periods of time.   Empty your bladder before and after sexual intercourse.   After a bowel movement, women should cleanse from front to back. Use each tissue only once.  SEEK MEDICAL CARE IF:    You have back pain.   You develop a fever.   Your symptoms do not begin to resolve within 3 days.  SEEK IMMEDIATE MEDICAL CARE IF:    You have severe back pain or lower abdominal pain.   You develop chills.   You have nausea or vomiting.   You have continued burning or discomfort with urination.  MAKE SURE YOU:    Understand these instructions.   Will watch your condition.   Will get help right away if you are not doing well or get worse.  Document Released: 01/03/2005 Document Revised: 09/25/2011 Document Reviewed: 05/04/2011  ExitCare Patient Information 2014 [employer].

## 2013-06-17 NOTE — ED Notes (Signed)
Nurse First Rounds : Nurse explained delay , wait time and process to pt. Respirations unlabored / vital signs stable .

## 2013-06-18 NOTE — ED Provider Notes (Signed)
Medical screening examination/treatment/procedure(s) were conducted as a shared visit with non-physician practitioner(s) or resident  and myself.  I personally evaluated the patient during the encounter and agree with the findings and plan unless otherwise indicated.    I have personally reviewed any xrays and/ or EKG's with the provider and I agree with interpretation.   1 wk of crampy upper abdominal pain with suprapubic and vaginal discharge.  Pt refused pelvic exam, she understands it may limit our ability to treat appropriately and she said she will fup closely outpt for pelvic exam.  Exam mild epig and suprapubic tenderness, no guarding, well appearing.  MMM.  Treating empirically STD and UTI.  No fevers, no back pain.  STD, UTI Filed Vitals:   06/17/13 1448 06/17/13 1920 06/17/13 2021 06/17/13 2155  BP: 114/79 106/77 124/78 121/92  Pulse: 94 84 84 72  Temp: 98.7 F (37.1 C) 98.8 F (37.1 C)    TempSrc: Oral Oral    Resp: 18 14  18   SpO2: 100% 99% 100% 100%     Enid SkeensJoshua M Jumar Greenstreet, MD 06/18/13 1536

## 2013-09-28 ENCOUNTER — Inpatient Hospital Stay (HOSPITAL_COMMUNITY): Payer: Self-pay

## 2013-09-28 ENCOUNTER — Inpatient Hospital Stay (HOSPITAL_COMMUNITY)
Admission: AD | Admit: 2013-09-28 | Discharge: 2013-09-28 | Disposition: A | Discharge status: Home / Self Care | Payer: Self-pay | Source: Ambulatory Visit | Attending: Obstetrics | Admitting: Obstetrics

## 2013-09-28 ENCOUNTER — Encounter (HOSPITAL_COMMUNITY): Payer: Self-pay | Admitting: Medical

## 2013-09-28 DIAGNOSIS — Z87891 Personal history of nicotine dependence: Secondary | ICD-10-CM | POA: Insufficient documentation

## 2013-09-28 DIAGNOSIS — R109 Unspecified abdominal pain: Secondary | ICD-10-CM | POA: Insufficient documentation

## 2013-09-28 DIAGNOSIS — O10019 Pre-existing essential hypertension complicating pregnancy, unspecified trimester: Secondary | ICD-10-CM | POA: Insufficient documentation

## 2013-09-28 DIAGNOSIS — O9989 Other specified diseases and conditions complicating pregnancy, childbirth and the puerperium: Secondary | ICD-10-CM

## 2013-09-28 DIAGNOSIS — O26899 Other specified pregnancy related conditions, unspecified trimester: Secondary | ICD-10-CM

## 2013-09-28 DIAGNOSIS — O2390 Unspecified genitourinary tract infection in pregnancy, unspecified trimester: Secondary | ICD-10-CM | POA: Insufficient documentation

## 2013-09-28 DIAGNOSIS — N390 Urinary tract infection, site not specified: Secondary | ICD-10-CM | POA: Insufficient documentation

## 2013-09-28 DIAGNOSIS — O99891 Other specified diseases and conditions complicating pregnancy: Secondary | ICD-10-CM | POA: Insufficient documentation

## 2013-09-28 LAB — CBC WITH DIFFERENTIAL/PLATELET
BASOS ABS: 0 10*3/uL (ref 0.0–0.1)
BASOS PCT: 0 % (ref 0–1)
EOS PCT: 1 % (ref 0–5)
Eosinophils Absolute: 0.1 10*3/uL (ref 0.0–0.7)
HCT: 38.3 % (ref 36.0–46.0)
Hemoglobin: 12.8 g/dL (ref 12.0–15.0)
Lymphocytes Relative: 31 % (ref 12–46)
Lymphs Abs: 2.5 10*3/uL (ref 0.7–4.0)
MCH: 29.7 pg (ref 26.0–34.0)
MCHC: 33.4 g/dL (ref 30.0–36.0)
MCV: 88.9 fL (ref 78.0–100.0)
MONOS PCT: 7 % (ref 3–12)
Monocytes Absolute: 0.6 10*3/uL (ref 0.1–1.0)
NEUTROS ABS: 4.8 10*3/uL (ref 1.7–7.7)
Neutrophils Relative %: 61 % (ref 43–77)
Platelets: 269 10*3/uL (ref 150–400)
RBC: 4.31 MIL/uL (ref 3.87–5.11)
RDW: 13.2 % (ref 11.5–15.5)
WBC: 8 10*3/uL (ref 4.0–10.5)

## 2013-09-28 LAB — URINALYSIS, ROUTINE W REFLEX MICROSCOPIC
BILIRUBIN URINE: NEGATIVE
GLUCOSE, UA: NEGATIVE mg/dL
HGB URINE DIPSTICK: NEGATIVE
KETONES UR: NEGATIVE mg/dL
Nitrite: POSITIVE — AB
Protein, ur: NEGATIVE mg/dL
Specific Gravity, Urine: 1.015 (ref 1.005–1.030)
UROBILINOGEN UA: 0.2 mg/dL (ref 0.0–1.0)
pH: 7 (ref 5.0–8.0)

## 2013-09-28 LAB — HCG, QUANTITATIVE, PREGNANCY: hCG, Beta Chain, Quant, S: 2168 m[IU]/mL — ABNORMAL HIGH (ref <5)

## 2013-09-28 LAB — URINE MICROSCOPIC-ADD ON

## 2013-09-28 LAB — POCT PREGNANCY, URINE: Preg Test, Ur: POSITIVE — AB

## 2013-09-28 MED ORDER — CEPHALEXIN 500 MG PO CAPS: 500.0000 mg | ORAL_CAPSULE | Freq: Four times a day (QID) | ORAL | Status: DC

## 2013-09-28 NOTE — MAU Provider Note (Signed)
History     CSN: [id number]  Arrival date and time: 09/28/13 2050   First Provider Initiated Contact with Patient 09/28/13 2129      No chief complaint on file.  HPI [name] is a 23 y.o. [phone]  at [id number] by unsure LMP who presents to MAU today with complaint of possible pregnancy and lower abdominal cramping. The patient states that she noted the cramping around 1445 today and went to get HPT. She had +HPT and came here for confirmation. She denies vaginal bleeding, N/V/D or fever today. She states a thick, white discharge without itching or irritation.   OB History   Grav Para Term Preterm Abortions TAB SAB Ect Mult Living   4 1  1 2 1 1  1 2       Past Medical History  Diagnosis Date  . Hypertension   . Preterm labor   . Urinary tract infection   . ZHYQMVHQ(469.6Headache(784.0)     Past Surgical History  Procedure Laterality Date  . Cesarean section    . Dilation and curettage of uterus    . Therapeutic abortion    . 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 Diabetes Mother   . Diabetes Maternal Grandmother   . Hypertension Maternal Grandmother   . Cancer Maternal Grandmother     colon cancer, brain tumour  . Other Neg Hx     History  Substance Use Topics  . Smoking status: Former Smoker    Quit date: 01/15/2013  . Smokeless tobacco: Never Used  . Alcohol Use: No    Allergies: No Known Allergies  Prescriptions prior to admission  Medication Sig Dispense Refill  . doxycycline (VIBRAMYCIN) 100 MG capsule Take 1 capsule (100 mg total) by mouth 2 (two) times daily.  20 capsule  0    Review of Systems  Constitutional: Negative for fever and malaise/fatigue.  Gastrointestinal: Positive for abdominal pain. Negative for nausea, vomiting, diarrhea and constipation.  Genitourinary: Negative for dysuria, urgency and frequency.       Neg - vaginal bleeding + vaginal discharge   Physical Exam    Last menstrual period 08/21/2013.  Physical Exam  Constitutional: She is oriented to person, place, and time. She appears well-developed and well-nourished. No distress.  HENT:  Head: Normocephalic and atraumatic.  Cardiovascular: Normal rate.   Respiratory: Effort normal.  GI: Soft. She exhibits no distension and no mass. There is no tenderness. There is no rebound and no guarding.  Genitourinary:  Patient declined pelvic exam. Had to leave to catch the bus  Neurological: She is alert and oriented to person, place, and time.  Skin: Skin is warm and dry. No erythema.  Psychiatric: She has a normal mood and affect.   Results for orders placed during the hospital encounter of 09/28/13 (from the past 24 hour(s))  URINALYSIS, ROUTINE W REFLEX MICROSCOPIC     Status: Abnormal   Collection Time    09/28/13  9:25 PM      Result Value Ref Range   Color, Urine YELLOW  YELLOW   APPearance CLEAR  CLEAR   Specific Gravity, Urine 1.015  1.005 - 1.030   pH 7.0  5.0 - 8.0   Glucose, UA NEGATIVE  NEGATIVE mg/dL   Hgb urine dipstick NEGATIVE  NEGATIVE   Bilirubin Urine NEGATIVE  NEGATIVE   Ketones, ur NEGATIVE  NEGATIVE mg/dL   Protein, ur  NEGATIVE  NEGATIVE mg/dL   Urobilinogen, UA 0.2  0.0 - 1.0 mg/dL   Nitrite POSITIVE (*) NEGATIVE   Leukocytes, UA TRACE (*) NEGATIVE  URINE MICROSCOPIC-ADD ON     Status: Abnormal   Collection Time    09/28/13  9:25 PM      Result Value Ref Range   Squamous Epithelial / LPF RARE  RARE   WBC, UA 0-2  <3 WBC/hpf   RBC / HPF 0-2  <3 RBC/hpf   Bacteria, UA MANY (*) RARE  POCT PREGNANCY, URINE     Status: Abnormal   Collection Time    09/28/13  9:28 PM      Result Value Ref Range   Preg Test, Ur POSITIVE (*) NEGATIVE  CBC WITH DIFFERENTIAL     Status: None   Collection Time    09/28/13  9:45 PM      Result Value Ref Range   WBC 8.0  4.0 - 10.5 K/uL   RBC 4.31  3.87 - 5.11 MIL/uL   Hemoglobin 12.8  12.0 - 15.0 g/dL   HCT 16.138.3  09.636.0 - 04.546.0 %   MCV  88.9  78.0 - 100.0 fL   MCH 29.7  26.0 - 34.0 pg   MCHC 33.4  30.0 - 36.0 g/dL   RDW 40.913.2  81.111.5 - 91.415.5 %   Platelets 269  150 - 400 K/uL   Neutrophils Relative % 61  43 - 77 %   Neutro Abs 4.8  1.7 - 7.7 K/uL   Lymphocytes Relative 31  12 - 46 %   Lymphs Abs 2.5  0.7 - 4.0 K/uL   Monocytes Relative 7  3 - 12 %   Monocytes Absolute 0.6  0.1 - 1.0 K/uL   Eosinophils Relative 1  0 - 5 %   Eosinophils Absolute 0.1  0.0 - 0.7 K/uL   Basophils Relative 0  0 - 1 %   Basophils Absolute 0.0  0.0 - 0.1 K/uL  HCG, QUANTITATIVE, PREGNANCY     Status: Abnormal   Collection Time    09/28/13  9:45 PM      Result Value Ref Range   hCG, Beta Chain, Quant, S 2168 (*) <5 mIU/mL    [place] Ob Comp Less 14 Wks  09/28/2013   CLINICAL DATA:  Abdominal pain. Unsure LMP. Quantitative beta HCG is pending.  EXAM: OBSTETRIC <14 WK US AND TRANSVAGINAL OB US  TECHNIQUE: Both transabdominal and transvaginal ultrasound examinations were performed for complete evaluation of the gestation as well as the maternal uterus, adnexal regions, and pelvic cul-de-sac. Transvaginal technique was performed to assess early pregnancy.  COMPARISON:  None.  FINDINGS: Intrauterine gestational sac: A single intrauterine gestational sac is identified.  Yolk sac:  Not visualized  Embryo:  Not visualized  Cardiac Activity: Not visualized  MSD:  4.6  mm   5 w   0  d                 US EDC: 05/31/2014  Maternal uterus/adnexae: No myometrial mass lesions demonstrated. No subchorionic hemorrhage. Both ovaries are visualized and demonstrate normal follicular changes. Corpus luteum cyst is identified in the left ovary. Small amount of free fluid in the pelvis.  IMPRESSION: Probable early intrauterine gestational sac, but no yolk sac, fetal pole, or cardiac activity yet visualized. Recommend follow-up quantitative B-HCG levels and follow-up US in 14 days to confirm and assess viability. This recommendation follows SRU consensus guidelines: Diagnostic Criteria  for Nonviable  Pregnancy Early in the First Trimester. Malva Limes Med 2013; 161:0960-45.   Electronically Signed   By: Burman Nieves M.D.   On: 09/28/2013 22:20   US Ob Transvaginal  09/28/2013   CLINICAL DATA:  Abdominal pain. Unsure LMP. Quantitative beta HCG is pending.  EXAM: OBSTETRIC <14 WK [place] AND TRANSVAGINAL OB US  TECHNIQUE: Both transabdominal and transvaginal ultrasound examinations were performed for complete evaluation of the gestation as well as the maternal uterus, adnexal regions, and pelvic cul-de-sac. Transvaginal technique was performed to assess early pregnancy.  COMPARISON:  None.  FINDINGS: Intrauterine gestational sac: A single intrauterine gestational sac is identified.  Yolk sac:  Not visualized  Embryo:  Not visualized  Cardiac Activity: Not visualized  MSD:  4.6  mm   5 w   0  d                 [place] EDC: 05/31/2014  Maternal uterus/adnexae: No myometrial mass lesions demonstrated. No subchorionic hemorrhage. Both ovaries are visualized and demonstrate normal follicular changes. Corpus luteum cyst is identified in the left ovary. Small amount of free fluid in the pelvis.  IMPRESSION: Probable early intrauterine gestational sac, but no yolk sac, fetal pole, or cardiac activity yet visualized. Recommend follow-up quantitative B-HCG levels and follow-up US in 14 days to confirm and assess viability. This recommendation follows SRU consensus guidelines: Diagnostic Criteria for Nonviable Pregnancy Early in the First Trimester. Malva Limes Med 2013; 409:8119-14.   Electronically Signed   By: Burman Nieves M.D.   On: 09/28/2013 22:20    MAU Course  Procedures None  MDM +UPT UA, wet prep, GC/Chlamydia, CBC, quant hCG and [place] today O+ blood type from previous visit in Epic  Assessment and Plan  A: IUGS at [redacted]w[redacted]d Abdominal pain in pregnancy, antepartum UTI  P: Discharge home Rx for Keflex sent to patient's pharmacy Bleeding/ecotpic precautions discussed Patient to return to MAU in  48 hours for follow-up labs or sooner if her condition were to change or worsen  Freddi Starr, PA-C  09/28/2013, 10:40 PM

## 2013-09-28 NOTE — Discharge Instructions (Signed)
Pregnancy, First Trimester °The first trimester is the first 3 months your baby is growing inside you. It is important to follow your doctor's instructions. °HOME CARE  °· Do not smoke. °· Do not drink alcohol. °· Only take medicine as told by your doctor. °· Exercise. °· Eat healthy foods. Eat regular, well-balanced meals. °· You can have sex (intercourse) if there are no other problems with the pregnancy. °· Things that help with morning sickness: °¨ Eat soda crackers before getting up in the morning. °¨ Eat 4 to 5 small meals rather than 3 large meals. °¨ Drink liquids between meals, not during meals. °· Go to all appointments as told. °· Take all vitamins or supplements as told by your doctor. °GET HELP RIGHT AWAY IF:  °· You develop a fever. °· You have a bad smelling fluid that is leaking from your vagina. °· There is bleeding from the vagina. °· You develop severe belly (abdominal) or back pain. °· You throw up (vomit) blood. It may look like coffee grounds. °· You lose more than 2 pounds in a week. °· You gain 5 pounds or more in a week. °· You gain more than 2 pounds in a week and you see puffiness (swelling) in your feet, ankles, or legs. °· You have severe dizziness or pass out (faint). °· You are around people who have German measles, chickenpox, or fifth disease. °· You have a headache, watery poop (diarrhea), pain with peeing (urinating), or cannot breath right. °Document Released: 09/12/2007 Document Revised: 06/18/2011 Document Reviewed: 02/03/2013 °ExitCare® Patient Information ©2015 [employer]. This information is not intended to replace advice given to you by your health care provider. Make sure you discuss any questions you have with your health care provider. ° °

## 2013-09-29 LAB — HIV ANTIBODY (ROUTINE TESTING W REFLEX): HIV: NONREACTIVE

## 2013-09-30 LAB — CULTURE, OB URINE: Colony Count: >=100000

## 2013-10-08 ENCOUNTER — Inpatient Hospital Stay (HOSPITAL_COMMUNITY): Payer: Self-pay

## 2013-10-08 ENCOUNTER — Encounter (HOSPITAL_COMMUNITY): Payer: Self-pay | Admitting: *Deleted

## 2013-10-08 ENCOUNTER — Inpatient Hospital Stay (HOSPITAL_COMMUNITY)
Admission: AD | Admit: 2013-10-08 | Discharge: 2013-10-08 | Disposition: A | Discharge status: Home / Self Care | Payer: Self-pay | Source: Ambulatory Visit | Attending: Obstetrics | Admitting: Obstetrics

## 2013-10-08 DIAGNOSIS — O2390 Unspecified genitourinary tract infection in pregnancy, unspecified trimester: Secondary | ICD-10-CM | POA: Insufficient documentation

## 2013-10-08 DIAGNOSIS — O26899 Other specified pregnancy related conditions, unspecified trimester: Secondary | ICD-10-CM

## 2013-10-08 DIAGNOSIS — N3000 Acute cystitis without hematuria: Secondary | ICD-10-CM

## 2013-10-08 DIAGNOSIS — Z833 Family history of diabetes mellitus: Secondary | ICD-10-CM | POA: Insufficient documentation

## 2013-10-08 DIAGNOSIS — O9989 Other specified diseases and conditions complicating pregnancy, childbirth and the puerperium: Secondary | ICD-10-CM

## 2013-10-08 DIAGNOSIS — Z87891 Personal history of nicotine dependence: Secondary | ICD-10-CM | POA: Insufficient documentation

## 2013-10-08 DIAGNOSIS — O209 Hemorrhage in early pregnancy, unspecified: Secondary | ICD-10-CM

## 2013-10-08 DIAGNOSIS — O208 Other hemorrhage in early pregnancy: Secondary | ICD-10-CM | POA: Insufficient documentation

## 2013-10-08 DIAGNOSIS — R109 Unspecified abdominal pain: Secondary | ICD-10-CM | POA: Insufficient documentation

## 2013-10-08 DIAGNOSIS — N390 Urinary tract infection, site not specified: Secondary | ICD-10-CM | POA: Insufficient documentation

## 2013-10-08 LAB — CBC WITH DIFFERENTIAL/PLATELET
BASOS ABS: 0 10*3/uL (ref 0.0–0.1)
BASOS PCT: 0 % (ref 0–1)
EOS PCT: 1 % (ref 0–5)
Eosinophils Absolute: 0.1 10*3/uL (ref 0.0–0.7)
HCT: 37.1 % (ref 36.0–46.0)
HEMOGLOBIN: 12.5 g/dL (ref 12.0–15.0)
Lymphocytes Relative: 44 % (ref 12–46)
Lymphs Abs: 2.9 10*3/uL (ref 0.7–4.0)
MCH: 29.8 pg (ref 26.0–34.0)
MCHC: 33.7 g/dL (ref 30.0–36.0)
MCV: 88.3 fL (ref 78.0–100.0)
MONOS PCT: 9 % (ref 3–12)
Monocytes Absolute: 0.6 10*3/uL (ref 0.1–1.0)
Neutro Abs: 3 10*3/uL (ref 1.7–7.7)
Neutrophils Relative %: 46 % (ref 43–77)
Platelets: 259 10*3/uL (ref 150–400)
RBC: 4.2 MIL/uL (ref 3.87–5.11)
RDW: 12.8 % (ref 11.5–15.5)
WBC: 6.6 10*3/uL (ref 4.0–10.5)

## 2013-10-08 LAB — URINALYSIS, ROUTINE W REFLEX MICROSCOPIC
Bilirubin Urine: NEGATIVE
Glucose, UA: NEGATIVE mg/dL
Ketones, ur: NEGATIVE mg/dL
Leukocytes, UA: NEGATIVE
NITRITE: POSITIVE — AB
PH: 6.5 (ref 5.0–8.0)
Protein, ur: NEGATIVE mg/dL
Specific Gravity, Urine: 1.025 (ref 1.005–1.030)
Urobilinogen, UA: 0.2 mg/dL (ref 0.0–1.0)

## 2013-10-08 LAB — URINE MICROSCOPIC-ADD ON

## 2013-10-08 LAB — HCG, QUANTITATIVE, PREGNANCY: hCG, Beta Chain, Quant, S: 24069 m[IU]/mL — ABNORMAL HIGH (ref <5)

## 2013-10-08 MED ORDER — CEFTRIAXONE SODIUM 1 G IJ SOLR: 1.0000 g | Freq: Once | INTRAMUSCULAR | Status: DC

## 2013-10-08 NOTE — Discharge Instructions (Signed)
Subchorionic Hematoma A subchorionic hematoma is a gathering of blood between the outer wall of the placenta and the inner wall of the womb (uterus). The placenta is the organ that connects the fetus to the wall of the uterus. The placenta performs the feeding, breathing (oxygen to the fetus), and waste removal (excretory work) of the fetus.  Subchorionic hematoma is the most common abnormality found on a result from ultrasonography done during the first trimester or early second trimester of pregnancy. If there has been little or no vaginal bleeding, early small hematomas usually shrink on their own and do not affect your baby or pregnancy. The blood is gradually absorbed over 1-2 weeks. When bleeding starts later in pregnancy or the hematoma is larger or occurs in an older pregnant woman, the outcome may not be as good. Larger hematomas may get bigger, which increases the chances for miscarriage. Subchorionic hematoma also increases the risk of premature detachment of the placenta from the uterus, preterm (premature) labor, and stillbirth. HOME CARE INSTRUCTIONS   Stay on bed rest if your health care provider recommends this. Although bed rest will not prevent more bleeding or prevent a miscarriage, your health care provider may recommend bed rest until you are advised otherwise.  Avoid heavy lifting (more than 10 lb [4.5 kg]), exercise, sexual intercourse, or douching as directed by your health care provider.  Keep track of the number of pads you use each day and how soaked (saturated) they are. Write down this information.  Do not use tampons.  Keep all follow-up appointments as directed by your health care provider. Your health care provider may ask you to have follow-up blood tests or ultrasound tests or both. SEEK IMMEDIATE MEDICAL CARE IF:   You have severe cramps in your stomach, back, abdomen, or pelvis.  You have a fever.  You pass large clots or tissue. Save any tissue for your health  care provider to look at.  Your bleeding increases or you become lightheaded, feel weak, or have fainting episodes. Document Released: 07/11/2006 Document Revised: 01/14/2013 Document Reviewed: 10/23/2012 [facility] Patient Information 2015 [place], [place]. This information is not intended to replace advice given to you by your health care provider. Make sure you discuss any questions you have with your health care provider. Urinary Tract Infection A urinary tract infection (UTI) can occur any place along the urinary tract. The tract includes the kidneys, ureters, bladder, and urethra. A type of germ called bacteria often causes a UTI. UTIs are often helped with antibiotic medicine.  HOME CARE   If given, take antibiotics as told by your doctor. Finish them even if you start to feel better.  Drink enough fluids to keep your pee (urine) clear or pale yellow.  Avoid tea, drinks with caffeine, and bubbly (carbonated) drinks.  Pee often. Avoid holding your pee in for a long time.  Pee before and after having sex (intercourse).  Wipe from front to back after you poop (bowel movement) if you are a woman. Use each tissue only once. GET HELP RIGHT AWAY IF:   You have back pain.  You have lower belly (abdominal) pain.  You have chills.  You feel sick to your stomach (nauseous).  You throw up (vomit).  Your burning or discomfort with peeing does not go away.  You have a fever.  Your symptoms are not better in 3 days. MAKE SURE YOU:   Understand these instructions.  Will watch your condition.  Will get help right away if  you are not doing well or get worse. Document Released: 09/12/2007 Document Revised: 12/19/2011 Document Reviewed: 10/25/2011 [facility] Patient Information 2015 [place], [place]. This information is not intended to replace advice given to you by your health care provider. Make sure you discuss any questions you have with your health care provider.

## 2013-10-08 NOTE — MAU Note (Signed)
Patient states she had been seen in MAU for bleeding on 6-22. Has been bleeding every day since then with some back pain. Got heavier this am and was passing clots.

## 2013-10-08 NOTE — MAU Provider Note (Signed)
History     CSN: [id number]  Arrival date and time: 10/08/13 1708   First Provider Initiated Contact with Patient 10/08/13 1853      Chief Complaint  Patient presents with  . Vaginal Bleeding  . Back Pain   HPI [name] is 23 y.o. [phone] 6635w3d weeks presenting with vaginal bleeding and cramping. States the bleeding was heavy with clots when she woke up.  Less bleeding but cramping continues.  She was seen here 09/28/13. She is a patient of Dr. Tamela OddiJackson-Moore.  On that date BHCG was 2168 and U/S showed probable early GS without YS or FP.  BLOOD TYPE per previous record is O Positive.  She was dx with UTI and Rx sent for Keflex. Patient did not pick Rx up and she did not return for f/u BHCG. Patient denies fever.   Past Medical History  Diagnosis Date  . Hypertension   . Preterm labor   . Urinary tract infection   . JYNWGNFA(213.0Headache(784.0)     Past Surgical History  Procedure Laterality Date  . Cesarean section    . Dilation and curettage of uterus    . Therapeutic abortion    . 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 Diabetes Mother   . Diabetes Maternal Grandmother   . Hypertension Maternal Grandmother   . Cancer Maternal Grandmother     colon cancer, brain tumour  . Other Neg Hx     History  Substance Use Topics  . Smoking status: Former Smoker    Quit date: 01/15/2013  . Smokeless tobacco: Never Used  . Alcohol Use: No    Allergies: No Known Allergies  Prescriptions prior to admission  Medication Sig Dispense Refill  . cephALEXin (KEFLEX) 500 MG capsule Take 1 capsule (500 mg total) by mouth 4 (four) times daily.  20 capsule  0    Review of Systems  Constitutional: Negative for fever and chills.  Gastrointestinal: Positive for abdominal pain (lower abdominal pain ).  Genitourinary: Negative for dysuria, urgency, frequency, hematuria and flank pain.       + for vaginal bleeding  Neurological:  Negative for headaches.   Physical Exam   Blood pressure 122/66, pulse 84, temperature 98.2 F (36.8 C), temperature source Oral, resp. rate 18, height 5\' 3"  (1.6 m), weight 208 lb (94.348 kg), last menstrual period 08/21/2013, SpO2 100.00%.  Physical Exam  Constitutional: She is oriented to person, place, and time. She appears well-developed and well-nourished. No distress.  HENT:  Head: Normocephalic.  Neck: Normal range of motion.  Cardiovascular: Normal rate.   Respiratory: Effort normal.  GI: Soft. She exhibits no distension and no mass. There is tenderness (lower mid abdominal pain-moderate). There is no rebound, no guarding and no CVA tenderness.  Genitourinary:  Neg for vaginal bleeding.  Neurological: She is alert and oriented to person, place, and time.  Skin: Skin is warm and dry.  Psychiatric: She has a normal mood and affect. Her behavior is normal.   Results for orders placed during the hospital encounter of 10/08/13 (from the past 48 hour(s))  URINALYSIS, ROUTINE W REFLEX MICROSCOPIC     Status: Abnormal   Collection Time    10/08/13  5:35 PM      Result Value Ref Range   Color, Urine YELLOW  YELLOW   APPearance HAZY (*) CLEAR   Specific Gravity, Urine 1.025  1.005 - 1.030  pH 6.5  5.0 - 8.0   Glucose, UA NEGATIVE  NEGATIVE mg/dL   Hgb urine dipstick MODERATE (*) NEGATIVE   Bilirubin Urine NEGATIVE  NEGATIVE   Ketones, ur NEGATIVE  NEGATIVE mg/dL   Protein, ur NEGATIVE  NEGATIVE mg/dL   Urobilinogen, UA 0.2  0.0 - 1.0 mg/dL   Nitrite POSITIVE (*) NEGATIVE   Leukocytes, UA NEGATIVE  NEGATIVE  URINE MICROSCOPIC-ADD ON     Status: Abnormal   Collection Time    10/08/13  5:35 PM      Result Value Ref Range   Squamous Epithelial / LPF RARE  RARE   WBC, UA 3-6  <3 WBC/hpf   RBC / HPF 0-2  <3 RBC/hpf   Bacteria, UA MANY (*) RARE  CBC WITH DIFFERENTIAL     Status: None   Collection Time    10/08/13  7:21 PM      Result Value Ref Range   WBC 6.6  4.0 - 10.5  K/uL   RBC 4.20  3.87 - 5.11 MIL/uL   Hemoglobin 12.5  12.0 - 15.0 g/dL   HCT 16.1  09.6 - 04.5 %   MCV 88.3  78.0 - 100.0 fL   MCH 29.8  26.0 - 34.0 pg   MCHC 33.7  30.0 - 36.0 g/dL   RDW 40.9  81.1 - 91.4 %   Platelets 259  150 - 400 K/uL   Neutrophils Relative % 46  43 - 77 %   Neutro Abs 3.0  1.7 - 7.7 K/uL   Lymphocytes Relative 44  12 - 46 %   Lymphs Abs 2.9  0.7 - 4.0 K/uL   Monocytes Relative 9  3 - 12 %   Monocytes Absolute 0.6  0.1 - 1.0 K/uL   Eosinophils Relative 1  0 - 5 %   Eosinophils Absolute 0.1  0.0 - 0.7 K/uL   Basophils Relative 0  0 - 1 %   Basophils Absolute 0.0  0.0 - 0.1 K/uL   US Ob Transvaginal  10/08/2013   CLINICAL DATA:  Increased bleeding/cramping in early pregnancy  EXAM: TRANSVAGINAL OB ULTRASOUND  TECHNIQUE: Transvaginal ultrasound was performed for complete evaluation of the gestation as well as the maternal uterus, adnexal regions, and pelvic cul-de-sac.  COMPARISON:  09/28/2013  FINDINGS: Intrauterine gestational sac: Visualized/normal in shape.  Yolk sac:  Present  Embryo:  Present  Cardiac Activity: Present  Heart Rate: 158 bpm  CRL:   4.4  mm   6 w 1 d                  [place] EDC: 06/02/2014  Maternal uterus/adnexae: Small subchorionic hemorrhage.  Right ovary is within normal limits, measuring 2.3 x 1.6 x 1.5 cm.  Left ovary is within normal limits, measuring 2.7 x 2.4 x 3.0 cm.  Trace free fluid.  IMPRESSION: Single live intrauterine gestation with estimated gestational age [redacted] weeks 1 day by crown-rump length.   Electronically Signed   By: Charline Bills M.D.   On: 10/08/2013 19:53    MAU Course  Procedures None  MDM Patient has Rx at Pharmacy for Keflex from 6/22--encouraged her to fill and take all medication  Assessment and Plan  Care turned over to J. Ethier, PA at 20:00  MDM IM rocephin 1 G given in MAU prior to discharge  A: SIUP at [redacted]w[redacted]d Small subchorionic hemorrhage UTI  P: Discharge home Patient again encouraged to pick up Rx  for Keflex Warning signs  for Pyelonephritis discussed Patient advised to follow-up with Dr. Clearance [name] as scheduled Bleeding precautions discussed Patient may return to MAU as needed or if her condition were to change or worsen  Freddi StarrJulie N Ethier, PA-C  10/08/2013, 8:09 PM

## 2013-10-18 ENCOUNTER — Encounter (HOSPITAL_COMMUNITY): Payer: Self-pay | Admitting: *Deleted

## 2013-10-18 ENCOUNTER — Inpatient Hospital Stay (HOSPITAL_COMMUNITY)
Admission: AD | Admit: 2013-10-18 | Discharge: 2013-10-18 | Discharge status: Against Medical Advice | Payer: Medicaid Other | Source: Ambulatory Visit | Attending: Obstetrics & Gynecology | Admitting: Obstetrics & Gynecology

## 2013-10-18 DIAGNOSIS — L509 Urticaria, unspecified: Secondary | ICD-10-CM | POA: Insufficient documentation

## 2013-10-18 DIAGNOSIS — Z87891 Personal history of nicotine dependence: Secondary | ICD-10-CM | POA: Diagnosis not present

## 2013-10-18 DIAGNOSIS — I10 Essential (primary) hypertension: Secondary | ICD-10-CM | POA: Insufficient documentation

## 2013-10-18 DIAGNOSIS — T7840XA Allergy, unspecified, initial encounter: Secondary | ICD-10-CM | POA: Insufficient documentation

## 2013-10-18 DIAGNOSIS — L293 Anogenital pruritus, unspecified: Secondary | ICD-10-CM | POA: Diagnosis present

## 2013-10-18 DIAGNOSIS — Z833 Family history of diabetes mellitus: Secondary | ICD-10-CM | POA: Diagnosis not present

## 2013-10-18 MED ORDER — FAMOTIDINE 20 MG PO TABS
20.0000 mg | ORAL_TABLET | Freq: Once | ORAL | Status: AC
  Administered 2013-10-18: 20 mg via ORAL
  Filled 2013-10-18: qty 1

## 2013-10-18 MED ORDER — DIPHENHYDRAMINE HCL 25 MG PO CAPS
25.0000 mg | ORAL_CAPSULE | Freq: Once | ORAL | Status: AC
  Administered 2013-10-18: 25 mg via ORAL
  Filled 2013-10-18: qty 1

## 2013-10-18 MED ORDER — PREDNISONE 50 MG PO TABS
60.0000 mg | ORAL_TABLET | Freq: Once | ORAL | Status: AC
  Administered 2013-10-18: 60 mg via ORAL
  Filled 2013-10-18: qty 1

## 2013-10-18 NOTE — MAU Note (Signed)
Pt woke up this morning with hives that worsened throughout the day. Hives face, back, thighs, buttocks, knees, and under arms. Pt ate Mal[place]urkey, rice, gravy and cabbage.

## 2013-10-18 NOTE — MAU Provider Note (Signed)
  History     CSN: [id number]  Arrival date and time: 10/18/13 [id number]   First Provider Initiated Contact with Patient 10/18/13 1924      No chief complaint on file.  HPI [name] is a 23 y.o. [phone] at 8518w6d. She took "the abortion pill" 7/7 for 5+ wk preg, had heavy bleeding episode after. Her c/o today is itching x 3 d. Today she started developing hives and blisters on her skin.Back on hands, arms, legs.  Unsure what she could be having reaction to, no new products or foods.   OB History   Grav Para Term Preterm Abortions TAB SAB Ect Mult Living   4 1  1 2 1 1  1 2       Past Medical History  Diagnosis Date  . Hypertension   . Preterm labor   . Urinary tract infection   . MVHQIONG(295.2Headache(784.0)     Past Surgical History  Procedure Laterality Date  . Cesarean section    . Dilation and curettage of uterus    . Therapeutic abortion    . 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 Diabetes Mother   . Diabetes Maternal Grandmother   . Hypertension Maternal Grandmother   . Cancer Maternal Grandmother     colon cancer, brain tumour  . Other Neg Hx     History  Substance Use Topics  . Smoking status: Former Smoker    Quit date: 01/15/2013  . Smokeless tobacco: Never Used  . Alcohol Use: No    Allergies: No Known Allergies  No prescriptions prior to admission    Review of Systems  Constitutional: Negative for fever and chills.  Skin: Positive for itching and rash.   Physical Exam   Last menstrual period 08/21/2013.  Physical Exam  Constitutional: She is oriented to person, place, and time. She appears well-developed and well-nourished. She appears distressed.  GI: Soft. There is no tenderness.  Musculoskeletal: Normal range of motion.  Neurological: She is alert and oriented to person, place, and time.  Skin: Rash noted. There is erythema.  Scattered whelps and ? Blisters thighs and back of  hands  Psychiatric: She has a normal mood and affect. Her behavior is normal.    MAU Course  Procedures  MDM   Assessment and Plan   Allergic reaction to unknown substace  Benadryl 25, Pepcid 20 and Prednisone 60  Pt left prior to getting Rx for home meds Nevaya Nagele M. 10/18/2013, 8:59 PM

## 2013-10-18 NOTE — MAU Note (Signed)
Pt now states she has had the itching for the past three days, but has worsened much with hives this evening.  States the only thing [place] has taken [place]

## 2014-02-08 ENCOUNTER — Encounter (HOSPITAL_COMMUNITY): Payer: Self-pay | Admitting: *Deleted

## 2014-02-22 ENCOUNTER — Encounter (HOSPITAL_COMMUNITY): Payer: Self-pay

## 2014-02-22 ENCOUNTER — Emergency Department (HOSPITAL_COMMUNITY)
Admission: EM | Admit: 2014-02-22 | Discharge: 2014-02-22 | Discharge status: Against Medical Advice | Payer: Medicaid Other | Attending: Emergency Medicine | Admitting: Emergency Medicine

## 2014-02-22 DIAGNOSIS — R2232 Localized swelling, mass and lump, left upper limb: Secondary | ICD-10-CM | POA: Diagnosis not present

## 2014-02-22 DIAGNOSIS — R609 Edema, unspecified: Secondary | ICD-10-CM

## 2014-02-22 DIAGNOSIS — I10 Essential (primary) hypertension: Secondary | ICD-10-CM | POA: Insufficient documentation

## 2014-02-22 DIAGNOSIS — Z87440 Personal history of urinary (tract) infections: Secondary | ICD-10-CM | POA: Diagnosis not present

## 2014-02-22 NOTE — Progress Notes (Signed)
VASCULAR LAB PRELIMINARY  PRELIMINARY  PRELIMINARY  PRELIMINARY  Left upper extremity venous duplex completed.    Preliminary report:  Left:  No evidence of DVT or superficial thrombosis.    Yacine Droz, RVT 02/22/2014, 1:51 PM

## 2014-02-22 NOTE — ED Notes (Signed)
Pt reports noticing left arm swelling yesterday when she was in church. Left arm noted to be significantly swollen in triage compared to right.  Pt not reporting any pain to left arm.  Redness also noted.  Pulses present.

## 2014-06-10 ENCOUNTER — Encounter (HOSPITAL_COMMUNITY): Payer: Self-pay | Admitting: *Deleted

## 2014-06-10 ENCOUNTER — Inpatient Hospital Stay (HOSPITAL_COMMUNITY)
Admission: AD | Admit: 2014-06-10 | Discharge: 2014-06-10 | Disposition: A | Discharge status: Home / Self Care | Payer: Medicaid Other | Source: Ambulatory Visit | Attending: Obstetrics | Admitting: Obstetrics

## 2014-06-10 DIAGNOSIS — Z3200 Encounter for pregnancy test, result unknown: Secondary | ICD-10-CM | POA: Diagnosis present

## 2014-06-10 DIAGNOSIS — Z3201 Encounter for pregnancy test, result positive: Secondary | ICD-10-CM | POA: Insufficient documentation

## 2014-06-10 DIAGNOSIS — I10 Essential (primary) hypertension: Secondary | ICD-10-CM | POA: Insufficient documentation

## 2014-06-10 DIAGNOSIS — Z87891 Personal history of nicotine dependence: Secondary | ICD-10-CM | POA: Diagnosis not present

## 2014-06-10 LAB — POCT PREGNANCY, URINE: Preg Test, Ur: POSITIVE — AB

## 2014-06-10 NOTE — Discharge Instructions (Signed)
Safe Medications in Pregnancy  ° °Acne: °Benzoyl Peroxide °Salicylic Acid ° °Backache/Headache: °Tylenol: 2 regular strength every 4 hours OR °             2 Extra strength every 6 hours ° °Colds/Coughs/Allergies: °Benadryl (alcohol free) 25 mg every 6 hours as needed °Breath right strips °Claritin °Cepacol throat lozenges °Chloraseptic throat spray °Cold-Eeze- up to three times per day °Cough drops, alcohol free °Flonase (by prescription only) °Guaifenesin °Mucinex °Robitussin DM (plain only, alcohol free) °Saline nasal spray/drops °Sudafed (pseudoephedrine) & Actifed ** use only after [redacted] weeks gestation and if you do not have high blood pressure °Tylenol °Vicks Vaporub °Zinc lozenges °Zyrtec  ° °Constipation: °Colace °Ducolax suppositories °Fleet enema °Glycerin suppositories °Metamucil °Milk of magnesia °Miralax °Senokot °Smooth move tea ° °Diarrhea: °Kaopectate °Imodium A-D ° °*NO pepto Bismol ° °Hemorrhoids: °Anusol °Anusol HC °Preparation H °Tucks ° °Indigestion: °Tums °Maalox °Mylanta °Zantac  °Pepcid ° °Insomnia: °Benadryl (alcohol free) 25mg every 6 hours as needed °Tylenol PM °Unisom, no Gelcaps ° °Leg Cramps: °Tums °MagGel ° °Nausea/Vomiting:  °Bonine °Dramamine °Emetrol °Ginger extract °Sea bands °Meclizine  °Nausea medication to take during pregnancy:  °Unisom (doxylamine succinate 25 mg tablets) Take one tablet daily at bedtime. If symptoms are not adequately controlled, the dose can be increased to a maximum recommended dose of two tablets daily (1/2 tablet in the morning, 1/2 tablet mid-afternoon and one at bedtime). °Vitamin B6 100mg tablets. Take one tablet twice a day (up to 200 mg per day). ° °Skin Rashes: °Aveeno products °Benadryl cream or 25mg every 6 hours as needed °Calamine Lotion °1% cortisone cream ° °Yeast infection: °Gyne-lotrimin 7 °Monistat 7 ° ° °**If taking multiple medications, please check labels to avoid duplicating the same active ingredients °**take medication as directed on  the label °** Do not exceed 4000 mg of tylenol in 24 hours °**Do not take medications that contain aspirin or ibuprofen ° ° ° °First Trimester of Pregnancy °The first trimester of pregnancy is from week 1 until the end of week 12 (months 1 through 3). A week after a sperm fertilizes an egg, the egg will implant on the wall of the uterus. This embryo will begin to develop into a baby. Genes from you and your partner are forming the baby. The female genes determine whether the baby is a boy or a girl. At 6-8 weeks, the eyes and face are formed, and the heartbeat can be seen on ultrasound. At the end of 12 weeks, all the baby's organs are formed.  °Now that you are pregnant, you will want to do everything you can to have a healthy baby. Two of the most important things are to get good prenatal care and to follow your health care provider's instructions. Prenatal care is all the medical care you receive before the baby's birth. This care will help prevent, find, and treat any problems during the pregnancy and childbirth. °BODY CHANGES °Your body goes through many changes during pregnancy. The changes vary from woman to woman.  °· You may gain or lose a couple of pounds at first. °· You may feel sick to your stomach (nauseous) and throw up (vomit). If the vomiting is uncontrollable, call your health care provider. °· You may tire easily. °· You may develop headaches that can be relieved by medicines approved by your health care provider. °· You may urinate more often. Painful urination may mean you have a bladder infection. °· You may develop heartburn as a result of your   pregnancy. °· You may develop constipation because certain hormones are causing the muscles that push waste through your intestines to slow down. °· You may develop hemorrhoids or swollen, bulging veins (varicose veins). °· Your breasts may begin to grow larger and become tender. Your nipples may stick out more, and the tissue that surrounds them (areola)  may become darker. °· Your gums may bleed and may be sensitive to brushing and flossing. °· Dark spots or blotches (chloasma, mask of pregnancy) may develop on your face. This will likely fade after the baby is born. °· Your menstrual periods will stop. °· You may have a loss of appetite. °· You may develop cravings for certain kinds of food. °· You may have changes in your emotions from day to day, such as being excited to be pregnant or being concerned that something may go wrong with the pregnancy and baby. °· You may have more vivid and strange dreams. °· You may have changes in your hair. These can include thickening of your hair, rapid growth, and changes in texture. Some women also have hair loss during or after pregnancy, or hair that feels dry or thin. Your hair will most likely return to normal after your baby is born. °WHAT TO EXPECT AT YOUR PRENATAL VISITS °During a routine prenatal visit: °· You will be weighed to make sure you and the baby are growing normally. °· Your blood pressure will be taken. °· Your abdomen will be measured to track your baby's growth. °· The fetal heartbeat will be listened to starting around week 10 or 12 of your pregnancy. °· Test results from any previous visits will be discussed. °Your health care provider may ask you: °· How you are feeling. °· If you are feeling the baby move. °· If you have had any abnormal symptoms, such as leaking fluid, bleeding, severe headaches, or abdominal cramping. °· If you have any questions. °Other tests that may be performed during your first trimester include: °· Blood tests to find your blood type and to check for the presence of any previous infections. They will also be used to check for low iron levels (anemia) and Rh antibodies. Later in the pregnancy, blood tests for diabetes will be done along with other tests if problems develop. °· Urine tests to check for infections, diabetes, or protein in the urine. °· An ultrasound to confirm  the proper growth and development of the baby. °· An amniocentesis to check for possible genetic problems. °· Fetal screens for spina bifida and Down syndrome. °· You may need other tests to make sure you and the baby are doing well. °HOME CARE INSTRUCTIONS  °Medicines °· Follow your health care provider's instructions regarding medicine use. Specific medicines may be either safe or unsafe to take during pregnancy. °· Take your prenatal vitamins as directed. °· If you develop constipation, try taking a stool softener if your health care provider approves. °Diet °· Eat regular, well-balanced meals. Choose a variety of foods, such as meat or vegetable-based protein, fish, milk and low-fat dairy products, vegetables, fruits, and whole grain breads and cereals. Your health care provider will help you determine the amount of weight gain that is right for you. °· Avoid raw meat and uncooked cheese. These carry germs that can cause birth defects in the baby. °· Eating four or five small meals rather than three large meals a day may help relieve nausea and vomiting. If you start to feel nauseous, eating a few soda crackers   can be helpful. Drinking liquids between meals instead of during meals also seems to help nausea and vomiting. °· If you develop constipation, eat more high-fiber foods, such as fresh vegetables or fruit and whole grains. Drink enough fluids to keep your urine clear or pale yellow. °Activity and Exercise °· Exercise only as directed by your health care provider. Exercising will help you: °¨ Control your weight. °¨ Stay in shape. °¨ Be prepared for labor and delivery. °· Experiencing pain or cramping in the lower abdomen or low back is a good sign that you should stop exercising. Check with your health care provider before continuing normal exercises. °· Try to avoid standing for long periods of time. Move your legs often if you must stand in one place for a long time. °· Avoid heavy lifting. °· Wear  low-heeled shoes, and practice good posture. °· You may continue to have sex unless your health care provider directs you otherwise. °Relief of Pain or Discomfort °· Wear a good support bra for breast tenderness.   °· Take warm sitz baths to soothe any pain or discomfort caused by hemorrhoids. Use hemorrhoid cream if your health care provider approves.   °· Rest with your legs elevated if you have leg cramps or low back pain. °· If you develop varicose veins in your legs, wear support hose. Elevate your feet for 15 minutes, 3-4 times a day. Limit salt in your diet. °Prenatal Care °· Schedule your prenatal visits by the twelfth week of pregnancy. They are usually scheduled monthly at first, then more often in the last 2 months before delivery. °· Write down your questions. Take them to your prenatal visits. °· Keep all your prenatal visits as directed by your health care provider. °Safety °· Wear your seat belt at all times when driving. °· Make a list of emergency phone numbers, including numbers for family, friends, the hospital, and police and fire departments. °General Tips °· Ask your health care provider for a referral to a local prenatal education class. Begin classes no later than at the beginning of month 6 of your pregnancy. °· Ask for help if you have counseling or nutritional needs during pregnancy. Your health care provider can offer advice or refer you to specialists for help with various needs. °· Do not use hot tubs, steam rooms, or saunas. °· Do not douche or use tampons or scented sanitary pads. °· Do not cross your legs for long periods of time. °· Avoid cat litter boxes and soil used by cats. These carry germs that can cause birth defects in the baby and possibly loss of the fetus by miscarriage or stillbirth. °· Avoid all smoking, herbs, alcohol, and medicines not prescribed by your health care provider. Chemicals in these affect the formation and growth of the baby. °· Schedule a dentist  appointment. At home, brush your teeth with a soft toothbrush and be gentle when you floss. °SEEK MEDICAL CARE IF:  °· You have dizziness. °· You have mild pelvic cramps, pelvic pressure, or nagging pain in the abdominal area. °· You have persistent nausea, vomiting, or diarrhea. °· You have a bad smelling vaginal discharge. °· You have pain with urination. °· You notice increased swelling in your face, hands, legs, or ankles. °SEEK IMMEDIATE MEDICAL CARE IF:  °· You have a fever. °· You are leaking fluid from your vagina. °· You have spotting or bleeding from your vagina. °· You have severe abdominal cramping or pain. °· You have rapid weight gain   or loss. °· You vomit blood or material that looks like coffee grounds. °· You are exposed to German measles and have never had them. °· You are exposed to fifth disease or chickenpox. °· You develop a severe headache. °· You have shortness of breath. °· You have any kind of trauma, such as from a fall or a car accident. °Document Released: 03/20/2001 Document Revised: 08/10/2013 Document Reviewed: 02/03/2013 °[employer]® Patient Information ©2015 [employer]. This information is not intended to replace advice given to you by your health care provider. Make sure you discuss any questions you have with your health care provider. ° °

## 2014-06-10 NOTE — MAU Note (Signed)
Pt states she wants to verify pregnancy and get a verification letter.

## 2014-06-10 NOTE — MAU Provider Note (Signed)
  History     CSN: [id number]  Arrival date and time: 06/10/14 2026   None     No chief complaint on file.  HPI  [name] is a 24 y.o. [id number] at Unknown who presents today for pregnancy verification. She has had a + pregnancy test at home. She denies any pain or bleeding today.   Past Medical History  Diagnosis Date  . Hypertension   . Preterm labor   . Urinary tract infection   . HYQMVHQI(696.2Headache(784.0)     Past Surgical History  Procedure Laterality Date  . Cesarean section    . Dilation and curettage of uterus    . Therapeutic abortion    . 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 Diabetes Mother   . Diabetes Maternal Grandmother   . Hypertension Maternal Grandmother   . Cancer Maternal Grandmother     colon cancer, brain tumour  . Other Neg Hx     History  Substance Use Topics  . Smoking status: Former Smoker    Quit date: 01/15/2013  . Smokeless tobacco: Never Used  . Alcohol Use: No    Allergies: No Known Allergies  Prescriptions prior to admission  Medication Sig Dispense Refill Last Dose  . cephALEXin (KEFLEX) 500 MG capsule Take 1 capsule (500 mg total) by mouth 4 (four) times daily. 20 capsule 0 Past Week at Unknown time    ROS Physical Exam   Blood pressure 141/76, pulse 85, temperature 98.2 F (36.8 C), temperature source Oral, resp. rate 18, height 5\' 3"  (1.6 m), weight 104.781 kg (231 lb), last menstrual period 08/13/2013, unknown if currently breastfeeding.  Physical Exam  Nursing note and vitals reviewed. Constitutional: She is oriented to person, place, and time. She appears well-developed and well-nourished. No distress.  Cardiovascular: Normal rate.   Respiratory: Effort normal.  GI: Soft.  Neurological: She is alert and oriented to person, place, and time.  Skin: Skin is warm and dry.  Psychiatric: She has a normal mood and affect.    MAU Course   Procedures  Results for orders placed or performed during the hospital encounter of 06/10/14 (from the past 24 hour(s))  Pregnancy, urine POC     Status: Abnormal   Collection Time: 06/10/14  8:46 PM  Result Value Ref Range   Preg Test, Ur POSITIVE (A) NEGATIVE     Assessment and Plan   1. Encounter for pregnancy test, result positive    DC home First trimester precautions and information given Return [facility] as needed [facility] P[facility] verification [facility] given  Follow-up Information    Follow up with Brock BadHARPER,CHARLES A, MD.   Specialty:  Obstetrics and Gynecology   Contact information:   [address] [place] [id number] [phone]        Tawnya CrookHogan, Josephene Marrone Donovan 06/10/2014, 9:30 PM

## 2014-06-27 ENCOUNTER — Inpatient Hospital Stay (HOSPITAL_COMMUNITY)
Admission: AD | Admit: 2014-06-27 | Discharge: 2014-06-27 | Disposition: A | Discharge status: Home / Self Care | Payer: Medicaid Other | Source: Ambulatory Visit | Attending: Obstetrics and Gynecology | Admitting: Obstetrics and Gynecology

## 2014-06-27 DIAGNOSIS — Z3201 Encounter for pregnancy test, result positive: Secondary | ICD-10-CM | POA: Diagnosis not present

## 2014-06-27 DIAGNOSIS — I10 Essential (primary) hypertension: Secondary | ICD-10-CM | POA: Insufficient documentation

## 2014-06-27 DIAGNOSIS — Z87891 Personal history of nicotine dependence: Secondary | ICD-10-CM | POA: Insufficient documentation

## 2014-06-27 DIAGNOSIS — Z3200 Encounter for pregnancy test, result unknown: Secondary | ICD-10-CM | POA: Diagnosis present

## 2014-06-27 LAB — POCT PREGNANCY, URINE: Preg Test, Ur: POSITIVE — AB

## 2014-06-27 NOTE — MAU Provider Note (Signed)
History     CSN: [id number]  Arrival date and time: 06/27/14 1752   None     Chief Complaint  Patient presents with  . Possible Pregnancy   HPI This is a 24 y.o. female at [redacted]w[redacted]d who presents requesting to know exactly how far along she is. She was seen here on 06/10/14 for pregnancy verification also and was given a verification letter. She is concerned that her abdomen feels bigger than expected and she wants an ultrasound to verify her dates. She states she plans on having an abortion and they would charge her an extra $50 for an [place] prior to the abortion. She denies pain or bleeding..  RN Note:  Expand All Collapse All   Pt presents to MAU for confirmation of pregnancy. Denies any pain or vaginal bleeding          OB History    Gravida Para Term Preterm AB TAB SAB Ectopic Multiple Living   Past Medical History  Diagnosis Date  . Hypertension   . Preterm labor   . Urinary tract infection   . WUJWJXBJ(478.2)     Past Surgical History  Procedure Laterality Date  . Cesarean section    . Dilation and curettage of uterus    . Therapeutic abortion    . 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 Diabetes Mother   . Diabetes Maternal Grandmother   . Hypertension Maternal Grandmother   . Cancer Maternal Grandmother     colon cancer, brain tumour  . Other Neg Hx     History  Substance Use Topics  . Smoking status: Former Smoker    Quit date: 01/15/2013  . Smokeless tobacco: Never Used  . Alcohol Use: No    Allergies: No Known Allergies  Prescriptions prior to admission  Medication Sig Dispense Refill Last Dose  . cephALEXin (KEFLEX) 500 MG capsule Take 1 capsule (500 mg total) by mouth 4 (four) times daily. 20 capsule 0 Past Week at Unknown time    Review of Systems  Constitutional: Negative for fever, chills and malaise/fatigue.  Gastrointestinal: Negative for  nausea, vomiting, abdominal pain, diarrhea and constipation.  Genitourinary: Negative for dysuria.       Denies bleeding  Neurological: Negative for dizziness, focal weakness, weakness and headaches.  Psychiatric/Behavioral: The patient is nervous/anxious.    Physical Exam   Last menstrual period 05/16/2014, unknown if currently breastfeeding.  Physical Exam  Constitutional: She is oriented to person, place, and time. She appears well-developed and well-nourished. No distress.  HENT:  Head: Normocephalic.  Cardiovascular: Normal rate.   Respiratory: Effort normal.  Genitourinary:  Declines exam, has no complaints   Musculoskeletal: Normal range of motion.  Neurological: She is alert and oriented to person, place, and time.  Skin: Skin is warm and dry.  Psychiatric: She has a normal mood and affect.    MAU Course  Procedures  MDM Discussed nature of how we order [place] in the Emergency setting. Discussed that we need some kind of complaint to justify doing the [place] emergently  She continues to deny pain or bleeding. Discussed that if she calls our office for new ob appointment, we would probably do a dating [place] at the first visit. Offered a list of OB providers.  Results for orders placed or performed during the hospital encounter  of 06/27/14 (from the past 72 hour(s))  Pregnancy, urine POC     Status: Abnormal   Collection Time: 06/27/14  6:03 PM  Result Value Ref Range   Preg Test, Ur POSITIVE (A) NEGATIVE    Comment:        THE SENSITIVITY OF THIS METHODOLOGY IS >24 mIU/mL     Assessment and Plan  A;  Pregnancy at 7161w0d      No pregnancy complaints      Considering termination of pregnancy  P;  Discharge home       Resource list given       Follow up with prenatal care  [facility]tr[facility]ILLIAMS,Eh Sauseda 06/27/2014, 6:40 PM

## 2014-06-27 NOTE — MAU Note (Signed)
Pt presents to MAU for confirmation of pregnancy. Denies any pain or vaginal bleeding

## 2014-07-09 ENCOUNTER — Encounter (HOSPITAL_COMMUNITY): Payer: Self-pay | Admitting: *Deleted

## 2014-07-09 ENCOUNTER — Inpatient Hospital Stay (HOSPITAL_COMMUNITY)
Admission: AD | Admit: 2014-07-09 | Discharge: 2014-07-09 | Disposition: A | Discharge status: Home / Self Care | Payer: Medicaid Other | Source: Ambulatory Visit | Attending: Obstetrics & Gynecology | Admitting: Obstetrics & Gynecology

## 2014-07-09 ENCOUNTER — Inpatient Hospital Stay (HOSPITAL_COMMUNITY): Payer: Medicaid Other

## 2014-07-09 DIAGNOSIS — W109XXA Fall (on) (from) unspecified stairs and steps, initial encounter: Secondary | ICD-10-CM | POA: Insufficient documentation

## 2014-07-09 DIAGNOSIS — O99321 Drug use complicating pregnancy, first trimester: Secondary | ICD-10-CM

## 2014-07-09 DIAGNOSIS — O9989 Other specified diseases and conditions complicating pregnancy, childbirth and the puerperium: Secondary | ICD-10-CM | POA: Insufficient documentation

## 2014-07-09 DIAGNOSIS — M549 Dorsalgia, unspecified: Secondary | ICD-10-CM | POA: Diagnosis present

## 2014-07-09 DIAGNOSIS — Z87891 Personal history of nicotine dependence: Secondary | ICD-10-CM | POA: Diagnosis not present

## 2014-07-09 DIAGNOSIS — O2341 Unspecified infection of urinary tract in pregnancy, first trimester: Secondary | ICD-10-CM | POA: Diagnosis not present

## 2014-07-09 DIAGNOSIS — O26899 Other specified pregnancy related conditions, unspecified trimester: Secondary | ICD-10-CM

## 2014-07-09 DIAGNOSIS — T1490 Injury, unspecified: Secondary | ICD-10-CM

## 2014-07-09 DIAGNOSIS — Z3491 Encounter for supervision of normal pregnancy, unspecified, first trimester: Secondary | ICD-10-CM

## 2014-07-09 DIAGNOSIS — Z3A01 Less than 8 weeks gestation of pregnancy: Secondary | ICD-10-CM | POA: Insufficient documentation

## 2014-07-09 DIAGNOSIS — O9A211 Injury, poisoning and certain other consequences of external causes complicating pregnancy, first trimester: Secondary | ICD-10-CM | POA: Diagnosis not present

## 2014-07-09 DIAGNOSIS — R109 Unspecified abdominal pain: Secondary | ICD-10-CM | POA: Diagnosis not present

## 2014-07-09 LAB — CBC
HCT: 37.3 % (ref 36.0–46.0)
HEMOGLOBIN: 12.5 g/dL (ref 12.0–15.0)
MCH: 29.3 pg (ref 26.0–34.0)
MCHC: 33.5 g/dL (ref 30.0–36.0)
MCV: 87.6 fL (ref 78.0–100.0)
Platelets: 295 10*3/uL (ref 150–400)
RBC: 4.26 MIL/uL (ref 3.87–5.11)
RDW: 13 % (ref 11.5–15.5)
WBC: 6.7 10*3/uL (ref 4.0–10.5)

## 2014-07-09 LAB — URINALYSIS, ROUTINE W REFLEX MICROSCOPIC
BILIRUBIN URINE: NEGATIVE
GLUCOSE, UA: NEGATIVE mg/dL
Hgb urine dipstick: NEGATIVE
KETONES UR: NEGATIVE mg/dL
Nitrite: POSITIVE — AB
PH: 6.5 (ref 5.0–8.0)
Protein, ur: NEGATIVE mg/dL
SPECIFIC GRAVITY, URINE: 1.02 (ref 1.005–1.030)
Urobilinogen, UA: 0.2 mg/dL (ref 0.0–1.0)

## 2014-07-09 LAB — RAPID URINE DRUG SCREEN, HOSP PERFORMED
Amphetamines: NOT DETECTED
Barbiturates: NOT DETECTED
Benzodiazepines: NOT DETECTED
Cocaine: NOT DETECTED
OPIATES: NOT DETECTED
Tetrahydrocannabinol: POSITIVE — AB

## 2014-07-09 LAB — URINE MICROSCOPIC-ADD ON

## 2014-07-09 LAB — HCG, QUANTITATIVE, PREGNANCY: hCG, Beta Chain, Quant, S: 137799 m[IU]/mL — ABNORMAL HIGH (ref <5)

## 2014-07-09 LAB — WET PREP, GENITAL
CLUE CELLS WET PREP: NONE SEEN
TRICH WET PREP: NONE SEEN
WBC, Wet Prep HPF POC: NONE SEEN

## 2014-07-09 MED ORDER — CYCLOBENZAPRINE HCL 10 MG PO TABS
10.0000 mg | ORAL_TABLET | Freq: Once | ORAL | Status: AC
  Administered 2014-07-09: 10 mg via ORAL
  Filled 2014-07-09: qty 1

## 2014-07-09 MED ORDER — CYCLOBENZAPRINE HCL 10 MG PO TABS: 10.0000 mg | ORAL_TABLET | Freq: Three times a day (TID) | ORAL | Status: DC | PRN

## 2014-07-09 MED ORDER — NITROFURANTOIN MONOHYD MACRO 100 MG PO CAPS: 100.0000 mg | ORAL_CAPSULE | Freq: Two times a day (BID) | ORAL | Status: DC

## 2014-07-09 MED ORDER — TERCONAZOLE 0.4 % VA CREA: 1.0000 | TOPICAL_CREAM | Freq: Every day | VAGINAL | Status: DC

## 2014-07-09 NOTE — MAU Provider Note (Signed)
Chief Complaint: Fall   None    SUBJECTIVE HPI: [name] is a 24 y.o. [id number] at [redacted]w[redacted]d by LMP who presents to [facility] reporting back pain and right sided abdominal pain after a fall this morning. Patient report she fell down 13 steps while "playing around" with FOB who was present in the room. She denies dizziness, loss of consciousness, blurry vision, or syncope prior to the fall. Denies loss of consciousness after fall. Since the fall, her back and right abdomen are exquisitely painful and tender. She is able to walk, but it is uncomfortable. She endorses dizziness and nausea following the fall.  Patient also endorses white discharge, dark urine and urinary frequency. Patient denies vaginal bleeding, dysuria, headache, and fever.   FOB was asked to step out of the room. Patient stated she felt safe at home. Patient stated that although she had considered terminating the pregnancy at the previous visit on 06/27/14, she was now planning to keep the pregnancy. She stated that her family and the FOB wanted her to keep the pregnancy.   Past Medical History  Diagnosis Date  . Hypertension   . Preterm labor   . Urinary tract infection   . Headache(784.0)    OB History  Gravida Para Term Preterm AB SAB TAB Ectopic Multiple Living  # Outcome Date GA Lbr Len/2nd Weight Sex Delivery Anes PTL Lv  5 Current           4A Preterm 05/17/10     CS-LTranv  Y      Comments: PIH  4B Preterm 05/17/10       Y   3 Gravida           2 SAB           1 TAB              Past Surgical History  Procedure Laterality Date  . Cesarean section    . Dilation and curettage of uterus    . Therapeutic abortion    . 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    Removal;right   History   Social History  . Marital Status: Single    Spouse Name: N/A  . Number of Children: N/A  . Years of Education: N/A   Occupational History  . None    Social History Main Topics  . Smoking  status: Former Smoker    Quit date: 01/15/2013  . Smokeless tobacco: Never Used  . Alcohol Use: No  . Drug Use: No     Comment: daily , recently started 2 months prior  . Sexual Activity: Yes     Comment: used to smoke black and milds for a period of a few months   Other Topics Concern  . Not on file   Social History Narrative    No current facility-administered medications on file prior to encounter.   No current outpatient prescriptions on file prior to encounter.   No Known Allergies  Review of Systems  Constitutional: Negative for fever and chills.  Eyes: Negative for blurred vision.  Gastrointestinal: Negative for abdominal pain.  Genitourinary: Positive for frequency. Negative for dysuria and hematuria.  Musculoskeletal: Positive for back pain and falls.       Right-sided pain  Neurological: Negative for dizziness, loss of consciousness and headaches.     OBJECTIVE Blood pressure 122/54, pulse 77, temperature 98.6 F (37 C), resp. rate  28, height 5\' 3"  (1.6 m), weight 104.781 kg (231 lb), last menstrual period 05/16/2014, SpO2 100 %,   GENERAL: Well-developed, well-nourished female in mild distress.  HEART: normal rate RESP: normal effort GI: Abdomen soft, right-sided tender. Positive bowel sounds 4. MS: Nontender, no edema NEURO: Alert and oriented SKIN: no bruises, no scratches, no erythema SPECULUM EXAM: NEFG, thick yellow discharge, no blood noted, cervix clean BIMANUAL: cervix non-tender; uterus normal size, no adnexal tenderness or masses  LAB RESULTS Results for orders placed or performed during the hospital encounter of 07/09/14 (from the past 24 hour(s))  Urinalysis, Routine w reflex microscopic     Status: Abnormal   Collection Time: 07/09/14  1:00 PM  Result Value Ref Range   Color, Urine YELLOW YELLOW   APPearance CLEAR CLEAR   Specific Gravity, Urine 1.020 1.005 - 1.030   pH 6.5 5.0 - 8.0   Glucose, UA NEGATIVE NEGATIVE mg/dL   Hgb urine  dipstick NEGATIVE NEGATIVE   Bilirubin Urine NEGATIVE NEGATIVE   Ketones, ur NEGATIVE NEGATIVE mg/dL   Protein, ur NEGATIVE NEGATIVE mg/dL   Urobilinogen, UA 0.2 0.0 - 1.0 mg/dL   Nitrite POSITIVE (A) NEGATIVE   Leukocytes, UA SMALL (A) NEGATIVE  Urine microscopic-add on     Status: Abnormal   Collection Time: 07/09/14  1:00 PM  Result Value Ref Range   Squamous Epithelial / LPF FEW (A) RARE   WBC, UA 3-6 <3 WBC/hpf   RBC / HPF 0-2 <3 RBC/hpf   Bacteria, UA MANY (A) RARE   Urine-Other MUCOUS PRESENT   Urine rapid drug screen (hosp performed)     Status: Abnormal   Collection Time: 07/09/14  1:00 PM  Result Value Ref Range   Opiates NONE DETECTED NONE DETECTED   Cocaine NONE DETECTED NONE DETECTED   Benzodiazepines NONE DETECTED NONE DETECTED   Amphetamines NONE DETECTED NONE DETECTED   Tetrahydrocannabinol POSITIVE (A) NONE DETECTED   Barbiturates NONE DETECTED NONE DETECTED  CBC     Status: None   Collection Time: 07/09/14  3:40 PM  Result Value Ref Range   WBC 6.7 4.0 - 10.5 K/uL   RBC 4.26 3.87 - 5.11 MIL/uL   Hemoglobin 12.5 12.0 - 15.0 g/dL   HCT 16.137.3 09.636.0 - 04.546.0 %   MCV 87.6 78.0 - 100.0 fL   MCH 29.3 26.0 - 34.0 pg   MCHC 33.5 30.0 - 36.0 g/dL   RDW 40.913.0 81.111.5 - 91.415.5 %   Platelets 295 150 - 400 K/uL  hCG, quantitative, pregnancy     Status: Abnormal   Collection Time: 07/09/14  3:40 PM  Result Value Ref Range   hCG, Beta Chain, Quant, S [id number] (H) <5 mIU/mL  Wet prep, genital     Status: Abnormal   Collection Time: 07/09/14  5:15 PM  Result Value Ref Range   Yeast Wet Prep HPF POC FEW (A) NONE SEEN   Trich, Wet Prep NONE SEEN NONE SEEN   Clue Cells Wet Prep HPF POC NONE SEEN NONE SEEN   WBC, Wet Prep HPF POC NONE SEEN NONE SEEN    IMAGING [place] Ob Comp Less 14 Wks  07/09/2014   CLINICAL DATA:  Right-sided pelvic pain  EXAM: OBSTETRIC <14 WK US AND TRANSVAGINAL OB US  TECHNIQUE: Both transabdominal and transvaginal ultrasound examinations were performed for  complete evaluation of the gestation as well as the maternal uterus, adnexal regions, and pelvic cul-de-sac. Transvaginal technique was performed to assess early pregnancy.  COMPARISON:  None.  FINDINGS: Intrauterine gestational sac: Visualized/normal in shape.  Yolk sac:  Present  Embryo:  Present  Cardiac Activity: Present  Heart Rate: 146  bpm  CRL:  1.79 cm  mm   8 w   2 d                  [place] EDC: 02/16/2015  Maternal uterus/adnexae: Small area decreased echogenicity is noted adjacent to the gestational sac consistent with a small subchorionic hemorrhage. Cystic structures noted within the right ovary likely representing the corpus luteum cyst. Right ovary measures 3.6 x 2.2 x 2.8 cm. The left ovary measures 2.4 x 1.4 x 1.6 cm. No free fluid is noted.  IMPRESSION: Single live intrauterine gestation at 8 weeks 2 days.  Small subchorionic hemorrhage.   Electronically Signed   By: Alcide Clever M.D.   On: 07/09/2014 17:18   US Ob Transvaginal  07/09/2014   CLINICAL DATA:  Right-sided pelvic pain  EXAM: OBSTETRIC <14 WK [place] AND TRANSVAGINAL OB US  TECHNIQUE: Both transabdominal and transvaginal ultrasound examinations were performed for complete evaluation of the gestation as well as the maternal uterus, adnexal regions, and pelvic cul-de-sac. Transvaginal technique was performed to assess early pregnancy.  COMPARISON:  None.  FINDINGS: Intrauterine gestational sac: Visualized/normal in shape.  Yolk sac:  Present  Embryo:  Present  Cardiac Activity: Present  Heart Rate: 146  bpm  CRL:  1.79 cm  mm   8 w   2 d                  [place] EDC: 02/16/2015  Maternal uterus/adnexae: Small area decreased echogenicity is noted adjacent to the gestational sac consistent with a small subchorionic hemorrhage. Cystic structures noted within the right ovary likely representing the corpus luteum cyst. Right ovary measures 3.6 x 2.2 x 2.8 cm. The left ovary measures 2.4 x 1.4 x 1.6 cm. No free fluid is noted.  IMPRESSION: Single live  intrauterine gestation at 8 weeks 2 days.  Small subchorionic hemorrhage.   Electronically Signed   By: Alcide Clever M.D.   On: 07/09/2014 17:18    MAU COURSE Urinalysis GC/Chlamydia Wet prep HIV antibody HCG, quantitative CBC Transvaginal ultrasound Flexeril for back pain  ASSESSMENT 1. Traumatic injury during pregnancy in first trimester   2. Abdominal pain affecting pregnancy   3. UTI in pregnancy, antepartum, first trimester   4. Normal IUP (intrauterine pregnancy) on prenatal ultrasound, first trimester    PLAN Discharge home in stable condition. Back pain improved following flexeril.  Flexeril 10 mg TID prn x 15 tabs Macrobid 100 mg BID x 7 days Terazol 7  Given list of providers to establish routine prenatal care.      Follow-up Information    Please follow up.   Why:  Prenatal provider of your choice--see list below      Follow up with [facility] [place] [facility].   Contact information:   [address] [id number] mc [place] [address] [phone]       Medication List    TAKE these medications        cyclobenzaprine 10 MG tablet  Commonly known as:  FLEXERIL  Take 1 tablet (10 mg total) by mouth 3 (three) times daily as needed for muscle spasms.     nitrofurantoin (macrocrystal-monohydrate) 100 MG capsule  Commonly known as:  MACROBID  Take 1 capsule (100 mg total) by mouth 2 (two) times daily.  terconazole 0.4 % vaginal cream  Commonly known as:  TERAZOL 7  Place 1 applicator vaginally at bedtime.        LEFTWICH-KIRBY, Jaylan Hinojosa, CNM 6:15 PM

## 2014-07-09 NOTE — Discharge Instructions (Signed)
Pregnancy and Urinary Tract Infection °A urinary tract infection (UTI) is a bacterial infection of the urinary tract. Infection of the urinary tract can include the ureters, kidneys (pyelonephritis), bladder (cystitis), and urethra (urethritis). All pregnant women should be screened for bacteria in the urinary tract. Identifying and treating a UTI will decrease the risk of preterm labor and developing more serious infections in both the mother and baby. °CAUSES °Bacteria germs cause almost all UTIs.  °RISK FACTORS °Many factors can increase your chances of getting a UTI during pregnancy. These include: °· Having a short urethra. °· Poor toilet and hygiene habits. °· Sexual intercourse. °· Blockage of urine along the urinary tract. °· Problems with the pelvic muscles or nerves. °· Diabetes. °· Obesity. °· Bladder problems after having several children. °· Previous history of UTI. °SIGNS AND SYMPTOMS  °· Pain, burning, or a stinging feeling when urinating. °· Suddenly feeling the need to urinate right away (urgency). °· Loss of bladder control (urinary incontinence). °· Frequent urination, more than is common with pregnancy. °· Lower abdominal or back discomfort. °· Cloudy urine. °· Blood in the urine (hematuria). °· Fever.  °When the kidneys are infected, the symptoms may be: °· Back pain. °· Flank pain on the right side more so than the left. °· Fever. °· Chills. °· Nausea. °· Vomiting. °DIAGNOSIS  °A urinary tract infection is usually diagnosed through urine tests. Additional tests and procedures are sometimes done. These may include: °· Ultrasound exam of the kidneys, ureters, bladder, and urethra. °· Looking in the bladder with a lighted tube (cystoscopy). °TREATMENT °Typically, UTIs can be treated with antibiotic medicines.  °HOME CARE INSTRUCTIONS  °· Only take over-the-counter or prescription medicines as directed by your health care provider. If you were prescribed antibiotics, take them as directed. Finish  them even if you start to feel better. °· Drink enough fluids to keep your urine clear or pale yellow. °· Do not have sexual intercourse until the infection is gone and your health care provider says it is okay. °· Make sure you are tested for UTIs throughout your pregnancy. These infections often come back.  °Preventing a UTI in the Future °· Practice good toilet habits. Always wipe from front to back. Use the tissue only once. °· Do not hold your urine. Empty your bladder as soon as possible when the urge comes. °· Do not douche or use deodorant sprays. °· Wash with soap and warm water around the genital area and the anus. °· Empty your bladder before and after sexual intercourse. °· Wear underwear with a cotton crotch. °· Avoid caffeine and carbonated drinks. They can irritate the bladder. °· Drink cranberry juice or take cranberry pills. This may decrease the risk of getting a UTI. °· Do not drink alcohol. °· Keep all your appointments and tests as scheduled.  °SEEK MEDICAL CARE IF:  °· Your symptoms get worse. °· You are still having fevers 2 or more days after treatment begins. °· You have a rash. °· You feel that you are having problems with medicines prescribed. °· You have abnormal vaginal discharge. °SEEK IMMEDIATE MEDICAL CARE IF:  °· You have back or flank pain. °· You have chills. °· You have blood in your urine. °· You have nausea and vomiting. °· You have contractions of your uterus. °· You have a gush of fluid from the vagina. °MAKE SURE YOU: °· Understand these instructions.   °· Will watch your condition.   °· Will get help right away if you are not doing   well or get worse.  Document Released: 07/21/2010 Document Revised: 01/14/2013 Document Reviewed: 10/23/2012 [facility] Patient Information 2015 [place], [place]. This information is not intended to replace advice given to you by your health care provider. Make sure you discuss any questions you have with your health care provider. What Do I Need  to Know About Injuries During Pregnancy? Trauma is the most common cause of injury and death in pregnant women. This can also result in significant harm or death of the baby. Your baby is protected in the womb (uterus) by a sac filled with fluid (amniotic sac). Your baby can be harmed if there is direct, high-impact trauma to your abdomen and pelvis. This type of trauma can result in tearing of your uterus, the placenta pulling away from the wall of the uterus (placenta abruption), or the amniotic sac breaking open (rupture of membranes). These injuries can decrease or stop the blood supply to your baby or cause you to go into labor earlier than expected. Minor falls and low-impact automobile accidents do not usually harm your baby, even if they do minimally harm you. WHAT KIND OF INJURIES CAN AFFECT MY PREGNANCY? The most common causes of injury or death to a baby include:  Falls. Falls are more common in the second and third trimester of the pregnancy. Factors that increase your risk of falling include:  Increase in your weight.  The change in your center of gravity.  Tripping over an object that cannot be seen.  Increased looseness (laxity) of your ligaments resulting in less coordinated movements (you may feel clumsy).  Falling during high-risk activities like horseback riding or skiing.  Automobile accidents. It is important to wear your seat belt properly, with the lap belt below your abdomen, and always practice safe driving.  Domestic violence or assault.  Burns (Metallurgistfire or electrical). The most common causes of injury or death to the pregnant woman include:  Injuries that cause severe bleeding, shock, and loss of blood flow to major organs.  Head and neck injuries that result in severe brain or spinal damage.  Chest trauma that can cause direct injury to the heart and lungs or any injury that affects the area enclosed by the ribs. Trauma to this area can result in cardiorespiratory  arrest. WHAT CAN I DO TO PROTECT MYSELF AND MY BABY FROM INJURY WHILE I AM PREGNANT?  Remove slippery rugs and loose objects on the floor that increase your risk of tripping.  Avoid walking on wet or slippery floors.  Wear comfortable shoes that have a good grip on the sole. Do not wear high-heeled shoes.  Always wear your seat belt properly, with the lap belt below your abdomen, and always practice safe driving. Do not ride on a motorcycle while pregnant.  Do not participate in high-impact activities or sports.  Avoid fires, starting fires, lifting heavy pots of boiling or hot liquids, and fixing electrical problems.  Only take over-the-counter or prescription medicines for pain, fever, or discomfort as directed by your health care provider.  Know your blood type and the father's blood type in case you develop vaginal bleeding or experience an injury for which a blood transfusion may be necessary.  Call your local emergency services (911 in the [place] if you are a victim of domestic violence or assault. Spousal abuse can be a significant cause of trauma during pregnancy. For help and support, contact the [employer] Domestic Violence Hotline. WHEN SHOULD I SEEK IMMEDIATE MEDICAL CARE?   You fall on  your abdomen or experience any high-force accident or injury.  You have been assaulted (domestic or otherwise).  You have been in a car accident.  You develop vaginal bleeding.  You develop fluid leaking from the vagina.  You develop uterine contractions (pelvic cramping, pain, or significant low back pain).  You become weak or faint, or have uncontrolled vomiting after trauma.  You had a serious burn. This includes burns to the face, neck, hands, or genitals, or burns greater than the size of your palm anywhere else.  You develop neck stiffness or pain after a fall or from other trauma.  You develop a headache or vision problems after a fall or from other trauma.  You do not feel  the baby moving or the baby is not moving as much as before a fall or other trauma. Document Released: 05/03/2004 Document Revised: 08/10/2013 Document Reviewed: 12/31/2012 [facility] Patient Information 2015 [place], [place]. This information is not intended to replace advice given to you by your health care provider. Make sure you discuss any questions you have with your health care provider. Yeast Vaginitis Vaginitis in a soreness, swelling and redness (inflammation) of the vagina and vulva. Monilial vaginitis is not a sexually transmitted infection. CAUSES  Yeast vaginitis is caused by yeast (candida) that is normally found in your vagina. With a yeast infection, the candida has overgrown in number to a point that upsets the chemical balance. SYMPTOMS   White, thick vaginal discharge.  Swelling, itching, redness and irritation of the vagina and possibly the lips of the vagina (vulva).  Burning or painful urination.  Painful intercourse. DIAGNOSIS  Things that may contribute to monilial vaginitis are:  Postmenopausal and virginal states.  Pregnancy.  Infections.  Being tired, sick or stressed, especially if you had monilial vaginitis in the past.  Diabetes. Good control will help lower the chance.  Birth control pills.  Tight fitting garments.  Using bubble bath, feminine sprays, douches or deodorant tampons.  Taking certain medications that kill germs (antibiotics).  Sporadic recurrence can occur if you become ill. TREATMENT  Your caregiver will give you medication.  There are several kinds of anti monilial vaginal creams and suppositories specific for monilial vaginitis. For recurrent yeast infections, use a suppository or cream in the vagina 2 times a week, or as directed.  Anti-monilial or steroid cream for the itching or irritation of the vulva may also be used. Get your caregiver's permission.  Painting the vagina with methylene blue solution may help if the  monilial cream does not work.  Eating yogurt may help prevent monilial vaginitis. HOME CARE INSTRUCTIONS   Finish all medication as prescribed.  Do not have sex until treatment is completed or after your caregiver tells you it is okay.  Take warm sitz baths.  Do not douche.  Do not use tampons, especially scented ones.  Wear cotton underwear.  Avoid tight pants and panty hose.  Tell your sexual partner that you have a yeast infection. They should go to their caregiver if they have symptoms such as mild rash or itching.  Your sexual partner should be treated as well if your infection is difficult to eliminate.  Practice safer sex. Use condoms.  Some vaginal medications cause latex condoms to fail. Vaginal medications that harm condoms are:  Cleocin cream.  Butoconazole (Femstat).  Terconazole (Terazol) vaginal suppository.  Miconazole (Monistat) (may be purchased over the counter). SEEK MEDICAL CARE IF:   You have a temperature by mouth above 102 F (38.9  C).  The infection is getting worse after 2 days of treatment.  The infection is not getting better after 3 days of treatment.  You develop blisters in or around your vagina.  You develop vaginal bleeding, and it is not your menstrual period.  You have pain when you urinate.  You develop intestinal problems.  You have pain with sexual intercourse. Document Released: 01/03/2005 Document Revised: 06/18/2011 Document Reviewed: 09/17/2008 [facility] Patient Information 2015 [place], [place]. This information is not intended to replace advice given to you by your health care provider. Make sure you discuss any questions you have with your health care provider.

## 2014-07-09 NOTE — MAU Note (Signed)
Pt presents to MAU with complaints of falling this morning down a flight of stairs (13). Reports she landed on her right side. Denies any vaginal bleeding at present. Having pain in her side and her lower back

## 2014-07-10 LAB — HIV ANTIBODY (ROUTINE TESTING W REFLEX): HIV Screen 4th Generation wRfx: NONREACTIVE

## 2014-07-11 ENCOUNTER — Encounter (HOSPITAL_COMMUNITY): Payer: Self-pay | Admitting: *Deleted

## 2014-07-11 ENCOUNTER — Emergency Department (HOSPITAL_COMMUNITY)
Admission: EM | Admit: 2014-07-11 | Discharge: 2014-07-11 | Disposition: A | Discharge status: Home / Self Care | Payer: Medicaid Other | Attending: Emergency Medicine | Admitting: Emergency Medicine

## 2014-07-11 DIAGNOSIS — Y92410 Unspecified street and highway as the place of occurrence of the external cause: Secondary | ICD-10-CM | POA: Insufficient documentation

## 2014-07-11 DIAGNOSIS — Z87440 Personal history of urinary (tract) infections: Secondary | ICD-10-CM | POA: Diagnosis not present

## 2014-07-11 DIAGNOSIS — Y998 Other external cause status: Secondary | ICD-10-CM | POA: Insufficient documentation

## 2014-07-11 DIAGNOSIS — Z8751 Personal history of pre-term labor: Secondary | ICD-10-CM | POA: Diagnosis not present

## 2014-07-11 DIAGNOSIS — M542 Cervicalgia: Secondary | ICD-10-CM

## 2014-07-11 DIAGNOSIS — Y9389 Activity, other specified: Secondary | ICD-10-CM | POA: Diagnosis not present

## 2014-07-11 DIAGNOSIS — S8012XA Contusion of left lower leg, initial encounter: Secondary | ICD-10-CM | POA: Insufficient documentation

## 2014-07-11 DIAGNOSIS — S4992XA Unspecified injury of left shoulder and upper arm, initial encounter: Secondary | ICD-10-CM | POA: Diagnosis not present

## 2014-07-11 DIAGNOSIS — I10 Essential (primary) hypertension: Secondary | ICD-10-CM | POA: Insufficient documentation

## 2014-07-11 DIAGNOSIS — S199XXA Unspecified injury of neck, initial encounter: Secondary | ICD-10-CM | POA: Diagnosis not present

## 2014-07-11 DIAGNOSIS — Z87891 Personal history of nicotine dependence: Secondary | ICD-10-CM | POA: Insufficient documentation

## 2014-07-11 NOTE — Discharge Instructions (Signed)
Cervical Sprain °A cervical sprain is an injury in the neck in which the strong, fibrous tissues (ligaments) that connect your neck bones stretch or tear. Cervical sprains can range from mild to severe. Severe cervical sprains can cause the neck vertebrae to be unstable. This can lead to damage of the spinal cord and can result in serious nervous system problems. The amount of time it takes for a cervical sprain to get better depends on the cause and extent of the injury. Most cervical sprains heal in 1 to 3 weeks. °CAUSES  °Severe cervical sprains may be caused by:  °· Contact sport injuries (such as from football, rugby, wrestling, hockey, auto racing, gymnastics, diving, martial arts, or boxing).   °· Motor vehicle collisions.   °· Whiplash injuries. This is an injury from a sudden forward and backward whipping movement of the head and neck.  °· Falls.   °Mild cervical sprains may be caused by:  °· Being in an awkward position, such as while cradling a telephone between your ear and shoulder.   °· Sitting in a chair that does not offer proper support.   °· Working at a poorly designed computer station.   °· Looking up or down for long periods of time.   °SYMPTOMS  °· Pain, soreness, stiffness, or a burning sensation in the front, back, or sides of the neck. This discomfort may develop immediately after the injury or slowly, 24 hours or more after the injury.   °· Pain or tenderness directly in the middle of the back of the neck.   °· Shoulder or upper back pain.   °· Limited ability to move the neck.   °· Headache.   °· Dizziness.   °· Weakness, numbness, or tingling in the hands or arms.   °· Muscle spasms.   °· Difficulty swallowing or chewing.   °· Tenderness and swelling of the neck.   °DIAGNOSIS  °Most of the time your health care provider can diagnose a cervical sprain by taking your history and doing a physical exam. Your health care provider will ask about previous neck injuries and any known neck  problems, such as arthritis in the neck. X-rays may be taken to find out if there are any other problems, such as with the bones of the neck. Other tests, such as a CT scan or MRI, may also be needed.  °TREATMENT  °Treatment depends on the severity of the cervical sprain. Mild sprains can be treated with rest, keeping the neck in place (immobilization), and pain medicines. Severe cervical sprains are immediately immobilized. Further treatment is done to help with pain, muscle spasms, and other symptoms and may include: °· Medicines, such as pain relievers, numbing medicines, or muscle relaxants.   °· Physical therapy. This may involve stretching exercises, strengthening exercises, and posture training. Exercises and improved posture can help stabilize the neck, strengthen muscles, and help stop symptoms from returning.   °HOME CARE INSTRUCTIONS  °· Put ice on the injured area.   °¨ Put ice in a plastic bag.   °¨ Place a towel between your skin and the bag.   °¨ Leave the ice on for 15-20 minutes, 3-4 times a day.   °· If your injury was severe, you may have been given a cervical collar to wear. A cervical collar is a two-piece collar designed to keep your neck from moving while it heals. °¨ Do not remove the collar unless instructed by your health care provider. °¨ If you have long hair, keep it outside of the collar. °¨ Ask your health care provider before making any adjustments to your collar. Minor   adjustments may be required over time to improve comfort and reduce pressure on your chin or on the back of your head.  Ifyou are allowed to remove the collar for cleaning or bathing, follow your health care provider's instructions on how to do so safely.  Keep your collar clean by wiping it with mild soap and water and drying it completely. If the collar you have been given includes removable pads, remove them every 1-2 days and hand wash them with soap and water. Allow them to air dry. They should be completely  dry before you wear them in the collar.  If you are allowed to remove the collar for cleaning and bathing, wash and dry the skin of your neck. Check your skin for irritation or sores. If you see any, tell your health care provider.  Do not drive while wearing the collar.   Only take over-the-counter or prescription medicines for pain, discomfort, or fever as directed by your health care provider.   Keep all follow-up appointments as directed by your health care provider.   Keep all physical therapy appointments as directed by your health care provider.   Make any needed adjustments to your workstation to promote good posture.   Avoid positions and activities that make your symptoms worse.   Warm up and stretch before being active to help prevent problems.  SEEK MEDICAL CARE IF:   Your pain is not controlled with medicine.   You are unable to decrease your pain medicine over time as planned.   Your activity level is not improving as expected.  SEEK IMMEDIATE MEDICAL CARE IF:   You develop any bleeding.  You develop stomach upset.  You have signs of an allergic reaction to your medicine.   Your symptoms get worse.   You develop new, unexplained symptoms.   You have numbness, tingling, weakness, or paralysis in any part of your body.  MAKE SURE YOU:   Understand these instructions.  Will watch your condition.  Will get help right away if you are not doing well or get worse. Document Released: 01/21/2007 Document Revised: 03/31/2013 Document Reviewed: 10/01/2012 [facility] Patient Information 2015 [employer], [place]. This information is not intended to replace advice given to you by your health care provider. Make sure you discuss any questions you have with your health care provider.  Please contact your OBGYN and inform them of your ED visit, please share all relevant information with them. Rest, Ice, heat, and tylenol as needed for pain. Avoid aggravating  activities. Monitor for new or worsening signs or symptoms and return as needed.
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
## 2014-07-12 LAB — CULTURE, OB URINE: Colony Count: 50000

## 2014-07-12 LAB — GC/CHLAMYDIA PROBE AMP (~~LOC~~) NOT AT ARMC
CHLAMYDIA, DNA PROBE: NEGATIVE
Neisseria Gonorrhea: NEGATIVE

## 2014-07-14 ENCOUNTER — Other Ambulatory Visit (HOSPITAL_COMMUNITY): Payer: Self-pay | Admitting: Advanced Practice Midwife

## 2014-07-14 DIAGNOSIS — O2340 Unspecified infection of urinary tract in pregnancy, unspecified trimester: Secondary | ICD-10-CM

## 2014-07-14 DIAGNOSIS — A599 Trichomoniasis, unspecified: Secondary | ICD-10-CM | POA: Insufficient documentation

## 2014-07-14 DIAGNOSIS — B951 Streptococcus, group B, as the cause of diseases classified elsewhere: Secondary | ICD-10-CM | POA: Insufficient documentation

## 2014-07-14 DIAGNOSIS — O2341 Unspecified infection of urinary tract in pregnancy, first trimester: Secondary | ICD-10-CM

## 2014-07-14 MED ORDER — FLUCONAZOLE 150 MG PO TABS: 150.0000 mg | ORAL_TABLET | Freq: Once | ORAL | Status: DC

## 2014-07-14 MED ORDER — METRONIDAZOLE 500 MG PO TABS: 500.0000 mg | ORAL_TABLET | Freq: Two times a day (BID) | ORAL | Status: DC

## 2014-07-14 MED ORDER — PENICILLIN V POTASSIUM 500 MG PO TABS: 500.0000 mg | ORAL_TABLET | Freq: Four times a day (QID) | ORAL | Status: DC
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
## 2014-07-17 DIAGNOSIS — O99321 Drug use complicating pregnancy, first trimester: Secondary | ICD-10-CM

## 2014-12-02 ENCOUNTER — Emergency Department (HOSPITAL_COMMUNITY)
Admission: EM | Admit: 2014-12-02 | Discharge: 2014-12-02 | Disposition: A | Discharge status: Home / Self Care | Payer: Medicaid Other | Attending: Emergency Medicine | Admitting: Emergency Medicine

## 2014-12-02 ENCOUNTER — Encounter (HOSPITAL_COMMUNITY): Payer: Self-pay

## 2014-12-02 DIAGNOSIS — L02214 Cutaneous abscess of groin: Secondary | ICD-10-CM | POA: Diagnosis not present

## 2014-12-02 DIAGNOSIS — Z87891 Personal history of nicotine dependence: Secondary | ICD-10-CM | POA: Insufficient documentation

## 2014-12-02 DIAGNOSIS — Z8751 Personal history of pre-term labor: Secondary | ICD-10-CM | POA: Diagnosis not present

## 2014-12-02 DIAGNOSIS — Z79899 Other long term (current) drug therapy: Secondary | ICD-10-CM | POA: Diagnosis not present

## 2014-12-02 DIAGNOSIS — N760 Acute vaginitis: Secondary | ICD-10-CM | POA: Diagnosis present

## 2014-12-02 DIAGNOSIS — Z87440 Personal history of urinary (tract) infections: Secondary | ICD-10-CM | POA: Diagnosis not present

## 2014-12-02 DIAGNOSIS — I10 Essential (primary) hypertension: Secondary | ICD-10-CM | POA: Diagnosis not present

## 2014-12-02 DIAGNOSIS — L0291 Cutaneous abscess, unspecified: Secondary | ICD-10-CM

## 2014-12-02 MED ORDER — LIDOCAINE HCL (PF) 1 % IJ SOLN
5.0000 mL | Freq: Once | INTRAMUSCULAR | Status: AC
  Administered 2014-12-02: 5 mL
  Filled 2014-12-02: qty 5

## 2014-12-02 NOTE — Discharge Instructions (Signed)
Do not shave your genitals. Please keep area clean and dry.  Abscess An abscess is an infected area that contains a collection of pus and debris.It can occur in almost any part of the body. An abscess is also known as a furuncle or boil. CAUSES  An abscess occurs when tissue gets infected. This can occur from blockage of oil or sweat glands, infection of hair follicles, or a minor injury to the skin. As the body tries to fight the infection, pus collects in the area and creates pressure under the skin. This pressure causes pain. People with weakened immune systems have difficulty fighting infections and get certain abscesses more often.  SYMPTOMS Usually an abscess develops on the skin and becomes a painful mass that is red, warm, and tender. If the abscess forms under the skin, you may feel a moveable soft area under the skin. Some abscesses break open (rupture) on their own, but most will continue to get worse without care. The infection can spread deeper into the body and eventually into the bloodstream, causing you to feel ill.  DIAGNOSIS  Your caregiver will take your medical history and perform a physical exam. A sample of fluid may also be taken from the abscess to determine what is causing your infection. TREATMENT  Your caregiver may prescribe antibiotic medicines to fight the infection. However, taking antibiotics alone usually does not cure an abscess. Your caregiver may need to make a small cut (incision) in the abscess to drain the pus. In some cases, gauze is packed into the abscess to reduce pain and to continue draining the area. HOME CARE INSTRUCTIONS   Only take over-the-counter or prescription medicines for pain, discomfort, or fever as directed by your caregiver.  If you were prescribed antibiotics, take them as directed. Finish them even if you start to feel better.  If gauze is used, follow your caregiver's directions for changing the gauze.  To avoid spreading the  infection:  Keep your draining abscess covered with a bandage.  Wash your hands well.  Do not share personal care items, towels, or whirlpools with others.  Avoid skin contact with others.  Keep your skin and clothes clean around the abscess.  Keep all follow-up appointments as directed by your caregiver. SEEK MEDICAL CARE IF:   You have increased pain, swelling, redness, fluid drainage, or bleeding.  You have muscle aches, chills, or a general ill feeling.  You have a fever. MAKE SURE YOU:   Understand these instructions.  Will watch your condition.  Will get help right away if you are not doing well or get worse. Document Released: 01/03/2005 Document Revised: 09/25/2011 Document Reviewed: 06/08/2011 [facility] Patient Information 2015 [place], [place]. This information is not intended to replace advice given to you by your health care provider. Make sure you discuss any questions you have with your health care provider.  Abscess Care After An abscess (also called a boil or furuncle) is an infected area that contains a collection of pus. Signs and symptoms of an abscess include pain, tenderness, redness, or hardness, or you may feel a moveable soft area under your skin. An abscess can occur anywhere in the body. The infection may spread to surrounding tissues causing cellulitis. A cut (incision) by the surgeon was made over your abscess and the pus was drained out. Gauze may have been packed into the space to provide a drain that will allow the cavity to heal from the inside outwards. The boil may be painful for  5 to 7 days. Most people with a boil do not have high fevers. Your abscess, if seen early, may not have localized, and may not have been lanced. If not, another appointment may be required for this if it does not get better on its own or with medications. HOME CARE INSTRUCTIONS   Only take over-the-counter or prescription medicines for pain, discomfort, or fever as directed  by your caregiver.  When you bathe, soak and then remove gauze or iodoform packs at least daily or as directed by your caregiver. You may then wash the wound gently with mild soapy water. Repack with gauze or do as your caregiver directs. SEEK IMMEDIATE MEDICAL CARE IF:   You develop increased pain, swelling, redness, drainage, or bleeding in the wound site.  You develop signs of generalized infection including muscle aches, chills, fever, or a general ill feeling.  An oral temperature above 102 F (38.9 C) develops, not controlled by medication. See your caregiver for a recheck if you develop any of the symptoms described above. If medications (antibiotics) were prescribed, take them as directed. Document Released: 10/12/2004 Document Revised: 06/18/2011 Document Reviewed: 06/09/2007 [facility] Patient Information 2015 [place], [place]. This information is not intended to replace advice given to you by your health care provider. Make sure you discuss any questions you have with your health care provider.

## 2014-12-02 NOTE — ED Notes (Signed)
Pt reports having a boil on the right labia minora, onset 2 days ago. No drainage noted.

## 2014-12-02 NOTE — ED Provider Notes (Signed)
CSN: [id number]     Arrival date & time 12/02/14  1635 History  This chart was scribed for non-physician practitioner, Everlene Farrier, PA-C working with Lorre Nick, MD by Doreatha Martin, ED scribe. This patient was seen in room TR01C/TR01C and the patient's care was started at 4:51 PM    Chief Complaint  Patient presents with  . Abscess   The history is provided by the patient. No language interpreter was used.    HPI Comments: [name] is a 24 y.o. female who presents to the Emergency Department complaining of a moderate, gradually worsening area of 8/10 pain and swelling near her vagina onset 2 days ago. No Hx of similar symptoms. No treatments tried PTA. NKDA. She denies drainage from the boil, fever, chills, abdominal pain, nausea, vomiting, dysuria, frequency, urgency, vaginal bleeding, vaginal discharge.    Past Medical History  Diagnosis Date  . Hypertension   . Preterm labor   . Urinary tract infection   . WUJWJXBJ(478.2)    Past Surgical History  Procedure Laterality Date  . Cesarean section    . Dilation and curettage of uterus    . Therapeutic abortion    . 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    Removal;right   Family History  Problem Relation Age of Onset  . Diabetes Mother   . Diabetes Maternal Grandmother   . Hypertension Maternal Grandmother   . Cancer Maternal Grandmother     colon cancer, brain tumour  . Other Neg Hx    Social History  Substance Use Topics  . Smoking status: Former Smoker    Quit date: 01/15/2013  . Smokeless tobacco: Never Used  . Alcohol Use: No   OB History    Gravida Para Term Preterm AB TAB SAB Ectopic Multiple Living   Review of Systems  Constitutional: Negative for fever and chills.  Gastrointestinal: Negative for nausea, vomiting and abdominal pain.  Genitourinary: Positive for vaginal pain. Negative for dysuria, urgency, frequency, vaginal bleeding and vaginal discharge.  Skin:       Abscess in  groin   Allergies  Review of patient's allergies indicates no known allergies.  Home Medications   Prior to Admission medications   Medication Sig Start Date End Date Taking? Authorizing Provider  cyclobenzaprine (FLEXERIL) 10 MG tablet Take 1 tablet (10 mg total) by mouth 3 (three) times daily as needed for muscle spasms. 07/09/14   Lisa A Leftwich-Kirby, CNM  fluconazole (DIFLUCAN) 150 MG tablet Take 1 tablet (150 mg total) by mouth once. 07/14/14   Aviva Signs, CNM  metroNIDAZOLE (FLAGYL) 500 MG tablet Take 1 tablet (500 mg total) by mouth 2 (two) times daily. 07/14/14   Aviva Signs, CNM  nitrofurantoin, macrocrystal-monohydrate, (MACROBID) 100 MG capsule Take 1 capsule (100 mg total) by mouth 2 (two) times daily. 07/09/14   Lisa A Leftwich-Kirby, CNM  penicillin v potassium (VEETID) 500 MG tablet Take 1 tablet (500 mg total) by mouth 4 (four) times daily. 07/14/14   Aviva Signs, CNM  terconazole (TERAZOL 7) 0.4 % vaginal cream Place 1 applicator vaginally at bedtime. 07/09/14   Wilmer Floor Leftwich-Kirby, CNM   BP 107/64 mmHg  Pulse 94  Temp(Src) 98.2 F (36.8 C) (Oral)  Resp 20  Ht  (1.626 m)  Wt 217 lb (98.431 kg)  BMI 37.23 kg/m2  SpO2 100%  LMP 11/28/2014  Breastfeeding? Unknown Physical Exam  Constitutional:  She is oriented to person, place, and time. She appears well-developed and well-nourished. No distress.  Nontoxic appearing.  HENT:  Head: Normocephalic and atraumatic.  Eyes: Right eye exhibits no discharge. Left eye exhibits no discharge.  Cardiovascular: Normal rate, regular rhythm and normal heart sounds.   Pulmonary/Chest: Effort normal and breath sounds normal. No respiratory distress.  Abdominal: Soft. Bowel sounds are normal. There is no tenderness. There is no guarding.  Genitourinary:  There is a 2 cm fluctuant abscess to the lateral aspect of her labia majora. It appears to be from an ingrown hair from shaving.   Neurological: She is alert and oriented  to person, place, and time. Coordination normal.  Skin: Skin is warm and dry. No rash noted. She is not diaphoretic. No pallor.  2 cm fluctuant abscess to the lateral aspect of the right labia majora. There is no discharge or surrounding erythema.   Psychiatric: She has a normal mood and affect. Her behavior is normal.  Nursing note and vitals reviewed.  ED Course  INCISION AND DRAINAGE Date/Time: 12/02/2014 5:40 PM Performed by: Everlene Farrier Authorized by: Everlene Farrier Consent: Verbal consent obtained. Risks and benefits: risks, benefits and alternatives were discussed Consent given by: patient Patient understanding: patient states understanding of the procedure being performed Patient consent: the patient's understanding of the procedure matches consent given Procedure consent: procedure consent matches procedure scheduled Relevant documents: relevant documents present and verified Site marked: the operative site was marked Required items: required blood products, implants, devices, and special equipment available Patient identity confirmed: verbally with patient Time out: Immediately prior to procedure a "time out" was called to verify the correct patient, procedure, equipment, support staff and site/side marked as required. Type: abscess Body area: anogenital Location details: vulva Anesthesia: local infiltration Local anesthetic: lidocaine 1% without epinephrine Anesthetic total: 1 ml Patient sedated: no Scalpel size: 11 Incision type: single straight Incision depth: dermal Complexity: simple Drainage: purulent Drainage amount: moderate Wound treatment: wound left open Packing material: none Patient tolerance: Patient tolerated the procedure well with no immediate complications   (including critical care time) DIAGNOSTIC STUDIES: Oxygen Saturation is 99% on RA, normal by my interpretation.    COORDINATION OF CARE: 4:56 PM Discussed treatment plan with pt at  bedside and pt agreed to plan.   Labs Review Labs Reviewed - No data to display  Imaging Review No results found.    EKG Interpretation None      Filed Vitals:   12/02/14 1643 12/02/14 1742  BP: 122/63 107/64  Pulse: 99 94  Temp: 97.9 F (36.6 C) 98.2 F (36.8 C)  TempSrc: Oral Oral  Resp: 16 20  Height: 5\' 4"  (1.626 m)   Weight: 217 lb (98.431 kg)   SpO2: 99% 100%     MDM   Meds given in ED:  Medications  lidocaine (PF) (XYLOCAINE) 1 % injection 5 mL (not administered)    New Prescriptions   No medications on file    Final diagnoses:  Abscess   This is a 24 year old female who presents to the emergency department with an abscess to the lateral aspect of her left labia. The abscess appears to be from an ingrown hair from shaving. I advised the patient to stop shaving her genitals. Patient has a 2 cm fluctuant abscess. Incision and drainage performed by me and tolerate it well by the patient. A moderate amount of purulent drainage was obtained. The wound was irrigated and the left open. Wound care instructions provided.  I advised the patient to follow-up with their primary care provider this week. I advised the patient to return to the emergency department with new or worsening symptoms or new concerns. The patient verbalized understanding and agreement with plan.    I personally performed the services described in this documentation, which was scribed in my presence. The recorded information has been reviewed and is accurate.    Everlene Farrier, PA-C 12/02/14 1755  Lorre Nick, MD 12/02/14 [phone]

## 2015-04-18 ENCOUNTER — Inpatient Hospital Stay (HOSPITAL_COMMUNITY)
Admission: AD | Admit: 2015-04-18 | Discharge: 2015-04-18 | Discharge status: Against Medical Advice | Payer: Medicaid Other | Source: Ambulatory Visit | Attending: Family Medicine | Admitting: Family Medicine

## 2015-04-18 ENCOUNTER — Encounter (HOSPITAL_COMMUNITY): Payer: Self-pay

## 2015-04-18 DIAGNOSIS — Z3201 Encounter for pregnancy test, result positive: Secondary | ICD-10-CM | POA: Diagnosis not present

## 2015-04-18 DIAGNOSIS — Z87891 Personal history of nicotine dependence: Secondary | ICD-10-CM | POA: Diagnosis not present

## 2015-04-18 DIAGNOSIS — N926 Irregular menstruation, unspecified: Secondary | ICD-10-CM

## 2015-04-18 DIAGNOSIS — M545 Low back pain: Secondary | ICD-10-CM | POA: Diagnosis not present

## 2015-04-18 LAB — CBC
HCT: 36.6 % (ref 36.0–46.0)
Hemoglobin: 12.1 g/dL (ref 12.0–15.0)
MCH: 29.3 pg (ref 26.0–34.0)
MCHC: 33.1 g/dL (ref 30.0–36.0)
MCV: 88.6 fL (ref 78.0–100.0)
Platelets: 242 10*3/uL (ref 150–400)
RBC: 4.13 MIL/uL (ref 3.87–5.11)
RDW: 13.1 % (ref 11.5–15.5)
WBC: 6.1 10*3/uL (ref 4.0–10.5)

## 2015-04-18 LAB — URINALYSIS, ROUTINE W REFLEX MICROSCOPIC
BILIRUBIN URINE: NEGATIVE
GLUCOSE, UA: NEGATIVE mg/dL
Hgb urine dipstick: NEGATIVE
Ketones, ur: NEGATIVE mg/dL
Leukocytes, UA: NEGATIVE
NITRITE: NEGATIVE
PH: 6 (ref 5.0–8.0)
PROTEIN: NEGATIVE mg/dL
Specific Gravity, Urine: 1.015 (ref 1.005–1.030)

## 2015-04-18 LAB — HCG, QUANTITATIVE, PREGNANCY: hCG, Beta Chain, Quant, S: 36 m[IU]/mL — ABNORMAL HIGH (ref <5)

## 2015-04-18 NOTE — MAU Note (Signed)
Pt C/O lower back pain since yesterday, pt thinks she might be pregnant.  LMP was approx 12/5.  Did HPT, but states line was very faint.

## 2015-04-18 NOTE — MAU Provider Note (Signed)
History     CSN: [id number]  Arrival date and time: 04/18/15 1714   First Provider Initiated Contact with Patient 04/18/15 1821         Chief Complaint  Patient presents with  . Back Pain  . Possible Pregnancy   [name] is a 25 y.o. female who presents for confirmation of pregnancy & low back pain.    Back Pain This is a new problem. The current episode started yesterday. The problem occurs intermittently. The problem is unchanged. The pain is present in the lumbar spine. The quality of the pain is described as aching. The pain does not radiate. The pain is at a severity of 0/10 (currently no pain). The symptoms are aggravated by bending and standing. Pertinent negatives include no abdominal pain, dysuria, fever, numbness or pelvic pain. Risk factors include pregnancy. She has tried nothing for the symptoms.  Possible Pregnancy This is a new problem. The current episode started today (faintly positive pregnancy test this morning). Pertinent negatives include no abdominal pain, fever, nausea, numbness, urinary symptoms or vomiting.      OB History    Gravida Para Term Preterm AB TAB SAB Ectopic Multiple Living   5 1  1 2 1 1  1 2       Past Medical History  Diagnosis Date  . Hypertension   . Preterm labor   . Urinary tract infection   . WUJWJXBJ(478.2)     Past Surgical History  Procedure Laterality Date  . Cesarean section    . Dilation and curettage of uterus    . Therapeutic abortion    . 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 Diabetes Mother   . Diabetes Maternal Grandmother   . Hypertension Maternal Grandmother   . Cancer Maternal Grandmother     colon cancer, brain tumour  . Other Neg Hx     Social History  Substance Use Topics  . Smoking status: Former Smoker    Quit date: 01/15/2013  . Smokeless tobacco: Never Used  . Alcohol Use: No    Allergies: No Known  Allergies  Prescriptions prior to admission  Medication Sig Dispense Refill Last Dose  . Prenatal Vit-Fe Fumarate-FA (PRENATAL MULTIVITAMIN) TABS tablet Take 1 tablet by mouth daily at 12 noon.   04/18/2015 at Unknown time    Review of Systems  Constitutional: Negative for fever.  Gastrointestinal: Negative for nausea, vomiting and abdominal pain.  Genitourinary: Negative for dysuria and pelvic pain.  Musculoskeletal: Positive for back pain.  Neurological: Negative for numbness.   Physical Exam   Blood pressure 107/61, pulse 85, temperature 98.7 F (37.1 C), temperature source Oral, resp. rate 18, height 5\' 4"  (1.626 m), weight 217 lb (98.431 kg), last menstrual period 03/14/2015, unknown if currently breastfeeding.  Physical Exam  Nursing note and vitals reviewed. Constitutional: She is oriented to person, place, and time. She appears well-developed and well-nourished. No distress.  HENT:  Head: Normocephalic and atraumatic.  Eyes: Conjunctivae are normal. Right eye exhibits no discharge. Left eye exhibits no discharge. No scleral icterus.  Neck: Normal range of motion.  Cardiovascular: Normal rate, regular rhythm and normal heart sounds.   No murmur heard. Respiratory: Effort normal and breath sounds normal. No respiratory distress. She has no wheezes.  GI: Soft. Bowel sounds are normal. She exhibits no distension. There is no tenderness. There is no rebound.  Musculoskeletal:  Lumbar back: She exhibits normal range of motion, no tenderness, no swelling, no pain (currently no pain) and no spasm.  Neurological: She is alert and oriented to person, place, and time.  Skin: Skin is warm and dry. She is not diaphoretic.  Psychiatric: She has a normal mood and affect. Her behavior is normal. Judgment and thought content normal.    MAU Course  Procedures Results for orders placed or performed during the hospital encounter of 04/18/15 (from the past 24 hour(s))  Urinalysis,  Routine w reflex microscopic (not [facility])     Status: None   Collection Time: 04/18/15  5:30 PM  Result Value Ref Range   Color, Urine YELLOW YELLOW   APPearance CLEAR CLEAR   Specific Gravity, Urine 1.015 1.005 - 1.030   pH 6.0 5.0 - 8.0   Glucose, UA NEGATIVE NEGATIVE mg/dL   Hgb urine dipstick NEGATIVE NEGATIVE   Bilirubin Urine NEGATIVE NEGATIVE   Ketones, ur NEGATIVE NEGATIVE mg/dL   Protein, ur NEGATIVE NEGATIVE mg/dL   Nitrite NEGATIVE NEGATIVE   Leukocytes, UA NEGATIVE NEGATIVE  hCG, quantitative, pregnancy     Status: Abnormal   Collection Time: 04/18/15  6:10 PM  Result Value Ref Range   hCG, Beta Chain, Quant, S 36 (H) <5 mIU/mL  CBC     Status: None   Collection Time: 04/18/15  6:10 PM  Result Value Ref Range   WBC 6.1 4.0 - 10.5 K/uL   RBC 4.13 3.87 - 5.11 MIL/uL   Hemoglobin 12.1 12.0 - 15.0 g/dL   HCT 11.936.6 14.736.0 - 82.946.0 %   MCV 88.6 78.0 - 100.0 fL   MCH 29.3 26.0 - 34.0 pg   MCHC 33.1 30.0 - 36.0 g/dL   RDW 56.213.1 13.011.5 - 86.515.5 %   Platelets 242 150 - 400 K/uL    MDM UPT negative - BHCG ordered  Assessment and Plan  A: Missed period Pregnancy   Patient left AMA without speaking with staff   Judeth HornErin Doryce Mcgregory, NP  04/18/2015, 5:57 PM

## 2015-04-18 NOTE — Progress Notes (Signed)
RN into room to check on pt and had left. RN checked waiting area and immediate outside area. RN could not find pt.

## 2015-04-19 ENCOUNTER — Encounter (HOSPITAL_COMMUNITY): Payer: Self-pay | Admitting: *Deleted

## 2015-04-19 ENCOUNTER — Inpatient Hospital Stay (HOSPITAL_COMMUNITY)
Admission: AD | Admit: 2015-04-19 | Discharge: 2015-04-19 | Disposition: A | Discharge status: Home / Self Care | Payer: Medicaid Other | Source: Ambulatory Visit | Attending: Obstetrics & Gynecology | Admitting: Obstetrics & Gynecology

## 2015-04-19 DIAGNOSIS — Z3201 Encounter for pregnancy test, result positive: Secondary | ICD-10-CM | POA: Diagnosis not present

## 2015-04-19 NOTE — Discharge Instructions (Signed)
Prenatal Care °WHAT IS PRENATAL CARE?  °Prenatal care is the process of caring for a pregnant woman before she gives birth. Prenatal care makes sure that she and her baby remain as healthy as possible throughout pregnancy. Prenatal care may be provided by a midwife, family practice health care provider, or a childbirth and pregnancy specialist (obstetrician). Prenatal care may include physical examinations, testing, treatments, and education on nutrition, lifestyle, and social support services. °WHY IS PRENATAL CARE SO IMPORTANT?  °Early and consistent prenatal care increases the chance that you and your baby will remain healthy throughout your pregnancy. This type of care also decreases a baby's risk of being born too early (prematurely), or being born smaller than expected (small for gestational age). Any underlying medical conditions you may have that could pose a risk during your pregnancy are discussed during prenatal care visits. You will also be monitored regularly for any new conditions that may arise during your pregnancy so they can be treated quickly and effectively. °WHAT HAPPENS DURING PRENATAL CARE VISITS? °Prenatal care visits may include the following: °Discussion °Tell your health care provider about any new signs or symptoms you have experienced since your last visit. These might include: °· Nausea or vomiting. °· Increased or decreased level of energy. °· Difficulty sleeping. °· Back or leg pain. °· Weight changes. °· Frequent urination. °· Shortness of breath with physical activity. °· Changes in your skin, such as the development of a rash or itchiness. °· Vaginal discharge or bleeding. °· Feelings of excitement or nervousness. °· Changes in your baby's movements. °You may want to write down any questions or topics you want to discuss with your health care provider and bring them with you to your appointment. °Examination °During your first prenatal care visit, you will likely have a complete  physical exam. Your health care provider will often examine your vagina, cervix, and the position of your uterus, as well as check your heart, lungs, and other body systems. As your pregnancy progresses, your health care provider will measure the size of your uterus and your baby's position inside your uterus. He or she may also examine you for early signs of labor. Your prenatal visits may also include checking your blood pressure and, after about 10-12 weeks of pregnancy, listening to your baby's heartbeat. °Testing °Regular testing often includes: °· Urinalysis. This checks your urine for glucose, protein, or signs of infection. °· Blood count. This checks the levels of white and red blood cells in your body. °· Tests for sexually transmitted infections (STIs). Testing for STIs at the beginning of pregnancy is routinely done and is required in many states. °· Antibody testing. You will be checked to see if you are immune to certain illnesses, such as rubella, that can affect a developing fetus. °· Glucose screen. Around 24-28 weeks of pregnancy, your blood glucose level will be checked for signs of gestational diabetes. Follow-up tests may be recommended. °· Group B strep. This is a bacteria that is commonly found inside a woman's vagina. This test will inform your health care provider if you need an antibiotic to reduce the amount of this bacteria in your body prior to labor and childbirth. °· Ultrasound. Many pregnant women undergo an ultrasound screening around 18-20 weeks of pregnancy to evaluate the health of the fetus and check for any developmental abnormalities. °· HIV (human immunodeficiency virus) testing. Early in your pregnancy, you will be screened for HIV. If you are at high risk for HIV, this test   may be repeated during your third trimester of pregnancy. You may be offered other testing based on your age, personal or family medical history, or other factors.  HOW OFTEN SHOULD I PLAN TO SEE MY  HEALTH CARE PROVIDER FOR PRENATAL CARE? Your prenatal care check-up schedule depends on any medical conditions you have before, or develop during, your pregnancy. If you do not have any underlying medical conditions, you will likely be seen for checkups:  Monthly, during the first 6 months of pregnancy.  Twice a month during months 7 and 8 of pregnancy.  Weekly starting in the 9th month of pregnancy and until delivery. If you develop signs of early labor or other concerning signs or symptoms, you may need to see your health care provider more often. Ask your health care provider what prenatal care schedule is best for you. WHAT CAN I DO TO KEEP MYSELF AND MY BABY AS HEALTHY AS POSSIBLE DURING MY PREGNANCY?  Take a prenatal vitamin containing 400 micrograms (0.4 mg) of folic acid every day. Your health care provider may also ask you to take additional vitamins such as iodine, vitamin D, iron, copper, and zinc.  Take 1500-2000 mg of calcium daily starting at your 20th week of pregnancy until you deliver your baby.  Make sure you are up to date on your vaccinations. Unless directed otherwise by your health care provider:  You should receive a tetanus, diphtheria, and pertussis (Tdap) vaccination between the 27th and 36th week of your pregnancy, regardless of when your last Tdap immunization occurred. This helps protect your baby from whooping cough (pertussis) after he or she is born.  You should receive an annual inactivated influenza vaccine (IIV) to help protect you and your baby from influenza. This can be done at any point during your pregnancy.  Eat a well-rounded diet that includes:  Fresh fruits and vegetables.  Lean proteins.  Calcium-rich foods such as milk, yogurt, hard cheeses, and dark, leafy greens.  Whole grain breads.  Do noteat seafood high in mercury, including:  Swordfish.  Tilefish.  Shark.  King mackerel.  More than 6 oz tuna per week.  Do not eat:  Raw  or undercooked meats or eggs.  Unpasteurized foods, such as soft cheeses (brie, blue, or feta), juices, and milks.  Lunch meats.  Hot dogs that have not been heated until they are steaming.  Drink enough water to keep your urine clear or pale yellow. For many women, this may be 10 or more 8 oz glasses of water each day. Keeping yourself hydrated helps deliver nutrients to your baby and may prevent the start of pre-term uterine contractions.  Do not use any tobacco products including cigarettes, chewing tobacco, or electronic cigarettes. If you need help quitting, ask your health care provider.  Do not drink beverages containing alcohol. No safe level of alcohol consumption during pregnancy has been determined.  Do not use any illegal drugs. These can harm your developing baby or cause a miscarriage.  Ask your health care provider or pharmacist before taking any prescription or over-the-counter medicines, herbs, or supplements.  Limit your caffeine intake to no more than 200 mg per day.  Exercise. Unless told otherwise by your health care provider, try to get 30 minutes of moderate exercise most days of the week. Do not  do high-impact activities, contact sports, or activities with a high risk of falling, such as horseback riding or downhill skiing.  Get plenty of rest.  Avoid anything that raises your  body temperature, such as hot tubs and saunas.  If you own a cat, do not empty its litter box. Bacteria contained in cat feces can cause an infection called toxoplasmosis. This can result in serious harm to the fetus.  Stay away from chemicals such as insecticides, lead, mercury, and cleaning or paint products that contain solvents.  Do not have any X-rays taken unless medically necessary.  Take a childbirth and breastfeeding preparation class. Ask your health care provider if you need a referral or recommendation.   This information is not intended to replace advice given to you by  your health care provider. Make sure you discuss any questions you have with your health care provider.   Document Released: 03/29/2003 Document Revised: 04/16/2014 Document Reviewed: 06/10/2013 Elsevier Interactive Patient Education 2016 [employer] First Trimester of Pregnancy The first trimester of pregnancy is from week 1 until the end of week 12 (months 1 through 3). During this time, your baby will begin to develop inside you. At 6-8 weeks, the eyes and face are formed, and the heartbeat can be seen on ultrasound. At the end of 12 weeks, all the baby's organs are formed. Prenatal care is all the medical care you receive before the birth of your baby. Make sure you get good prenatal care and follow all of your doctor's instructions. HOME CARE  Medicines  Take medicine only as told by your doctor. Some medicines are safe and some are not during pregnancy.  Take your prenatal vitamins as told by your doctor.  Take medicine that helps you poop (stool softener) as needed if your doctor says it is okay. Diet  Eat regular, healthy meals.  Your doctor will tell you the amount of weight gain that is right for you.  Avoid raw meat and uncooked cheese.  If you feel sick to your stomach (nauseous) or throw up (vomit):  Eat 4 or 5 small meals a day instead of 3 large meals.  Try eating a few soda crackers.  Drink liquids between meals instead of during meals.  If you have a hard time pooping (constipation):  Eat high-fiber foods like fresh vegetables, fruit, and whole grains.  Drink enough fluids to keep your pee (urine) clear or pale yellow. Activity and Exercise  Exercise only as told by your doctor. Stop exercising if you have cramps or pain in your lower belly (abdomen) or low back.  Try to avoid standing for long periods of time. Move your legs often if you must stand in one place for a long time.  Avoid heavy lifting.  Wear low-heeled shoes. Sit and stand up  straight.  You can have sex unless your doctor tells you not to. Relief of Pain or Discomfort  Wear a good support bra if your breasts are sore.  Take warm water baths (sitz baths) to soothe pain or discomfort caused by hemorrhoids. Use hemorrhoid cream if your doctor says it is okay.  Rest with your legs raised if you have leg cramps or low back pain.  Wear support hose if you have puffy, bulging veins (varicose veins) in your legs. Raise (elevate) your feet for 15 minutes, 3-4 times a day. Limit salt in your diet. Prenatal Care  Schedule your prenatal visits by the twelfth week of pregnancy.  Write down your questions. Take them to your prenatal visits.  Keep all your prenatal visits as told by your doctor. Safety  Wear your seat belt at all times when driving.  Make a  list of emergency phone numbers. The list should include numbers for family, friends, the hospital, and police and fire departments. General Tips  Ask your doctor for a referral to a local prenatal class. Begin classes no later than at the start of month 6 of your pregnancy.  Ask for help if you need counseling or help with nutrition. Your doctor can give you advice or tell you where to go for help.  Do not use hot tubs, steam rooms, or saunas.  Do not douche or use tampons or scented sanitary pads.  Do not cross your legs for long periods of time.  Avoid litter boxes and soil used by cats.  Avoid all smoking, herbs, and alcohol. Avoid drugs not approved by your doctor.  Do not use any tobacco products, including cigarettes, chewing tobacco, and electronic cigarettes. If you need help quitting, ask your doctor. You may get counseling or other support to help you quit.  Visit your dentist. At home, brush your teeth with a soft toothbrush. Be gentle when you floss. GET HELP IF:  You are dizzy.  You have mild cramps or pressure in your lower belly.  You have a nagging pain in your belly area.  You  continue to feel sick to your stomach, throw up, or have watery poop (diarrhea).  You have a bad smelling fluid coming from your vagina.  You have pain with peeing (urination).  You have increased puffiness (swelling) in your face, hands, legs, or ankles. GET HELP RIGHT AWAY IF:   You have a fever.  You are leaking fluid from your vagina.  You have spotting or bleeding from your vagina.  You have very bad belly cramping or pain.  You gain or lose weight rapidly.  You throw up blood. It may look like coffee grounds.  You are around people who have [place] measles, fifth disease, or chickenpox.  You have a very bad headache.  You have shortness of breath.  You have any kind of trauma, such as from a fall or a car accident.   This information is not intended to replace advice given to you by your health care provider. Make sure you discuss any questions you have with your health care provider.   Document Released: 09/12/2007 Document Revised: 04/16/2014 Document Reviewed: 02/03/2013 Elsevier Interactive Patient Education [employer]

## 2015-04-19 NOTE — [facility] Provider Note (Signed)
Patient presents to [facility] today for results. She states that she left AMA yesterday prior to blood hCG results. The patient continues to deny abdominal pain or vaginal bleeding. She had a +HPT yesterday. UPT was negative and that is why the hCG was drawn. The patient was informed of +hCG. Given pregnancy confirmation letter and list of OB providers. [facility]   Marny LowensteinJulie N Sherry Rogus, PA-C 04/19/2015 9:26 PM

## 2015-04-19 NOTE — [facility] Note (Signed)
Pt returns today because she states she was here yesterday and left before they gave her the results. Denies pain or bleeding.

## 2015-05-01 ENCOUNTER — Inpatient Hospital Stay (HOSPITAL_COMMUNITY)
Admission: AD | Admit: 2015-05-01 | Discharge: 2015-05-02 | Discharge status: Against Medical Advice | Payer: Medicaid Other | Source: Ambulatory Visit | Attending: Obstetrics & Gynecology | Admitting: Obstetrics & Gynecology

## 2015-05-01 NOTE — MAU Note (Signed)
Not in lobby

## 2015-05-02 ENCOUNTER — Inpatient Hospital Stay (HOSPITAL_COMMUNITY)
Admission: AD | Admit: 2015-05-02 | Discharge: 2015-05-02 | Disposition: A | Discharge status: Home / Self Care | Payer: Medicaid Other | Source: Ambulatory Visit | Attending: Family Medicine | Admitting: Family Medicine

## 2015-05-02 ENCOUNTER — Inpatient Hospital Stay (HOSPITAL_COMMUNITY): Payer: Medicaid Other

## 2015-05-02 ENCOUNTER — Encounter (HOSPITAL_COMMUNITY): Payer: Self-pay | Admitting: *Deleted

## 2015-05-02 DIAGNOSIS — N390 Urinary tract infection, site not specified: Secondary | ICD-10-CM

## 2015-05-02 DIAGNOSIS — O2341 Unspecified infection of urinary tract in pregnancy, first trimester: Secondary | ICD-10-CM

## 2015-05-02 DIAGNOSIS — M545 Low back pain: Secondary | ICD-10-CM | POA: Diagnosis present

## 2015-05-02 DIAGNOSIS — Z87891 Personal history of nicotine dependence: Secondary | ICD-10-CM | POA: Insufficient documentation

## 2015-05-02 DIAGNOSIS — Z3A01 Less than 8 weeks gestation of pregnancy: Secondary | ICD-10-CM | POA: Diagnosis not present

## 2015-05-02 DIAGNOSIS — R109 Unspecified abdominal pain: Secondary | ICD-10-CM | POA: Insufficient documentation

## 2015-05-02 DIAGNOSIS — O9989 Other specified diseases and conditions complicating pregnancy, childbirth and the puerperium: Secondary | ICD-10-CM | POA: Diagnosis not present

## 2015-05-02 DIAGNOSIS — O26899 Other specified pregnancy related conditions, unspecified trimester: Secondary | ICD-10-CM

## 2015-05-02 LAB — URINALYSIS, ROUTINE W REFLEX MICROSCOPIC
BILIRUBIN URINE: NEGATIVE
GLUCOSE, UA: NEGATIVE mg/dL
Hgb urine dipstick: NEGATIVE
Ketones, ur: NEGATIVE mg/dL
Leukocytes, UA: NEGATIVE
Nitrite: POSITIVE — AB
Protein, ur: NEGATIVE mg/dL
SPECIFIC GRAVITY, URINE: 1.02 (ref 1.005–1.030)
pH: 6 (ref 5.0–8.0)

## 2015-05-02 LAB — CBC
HEMATOCRIT: 37 % (ref 36.0–46.0)
Hemoglobin: 12.5 g/dL (ref 12.0–15.0)
MCH: 29.3 pg (ref 26.0–34.0)
MCHC: 33.8 g/dL (ref 30.0–36.0)
MCV: 86.7 fL (ref 78.0–100.0)
Platelets: 302 10*3/uL (ref 150–400)
RBC: 4.27 MIL/uL (ref 3.87–5.11)
RDW: 13.1 % (ref 11.5–15.5)
WBC: 7 10*3/uL (ref 4.0–10.5)

## 2015-05-02 LAB — URINE MICROSCOPIC-ADD ON

## 2015-05-02 LAB — HCG, QUANTITATIVE, PREGNANCY: hCG, Beta Chain, Quant, S: 5082 m[IU]/mL — ABNORMAL HIGH (ref <5)

## 2015-05-02 LAB — ABO/RH: ABO/RH(D): O POS

## 2015-05-02 MED ORDER — PHENAZOPYRIDINE HCL 200 MG PO TABS: 200.0000 mg | ORAL_TABLET | Freq: Three times a day (TID) | ORAL | Status: DC

## 2015-05-02 MED ORDER — CEPHALEXIN 500 MG PO CAPS: 500.0000 mg | ORAL_CAPSULE | Freq: Four times a day (QID) | ORAL | Status: DC

## 2015-05-02 NOTE — Discharge Instructions (Signed)
Pregnancy and Urinary Tract Infection °A urinary tract infection (UTI) is a bacterial infection of the urinary tract. Infection of the urinary tract can include the ureters, kidneys (pyelonephritis), bladder (cystitis), and urethra (urethritis). All pregnant women should be screened for bacteria in the urinary tract. Identifying and treating a UTI will decrease the risk of preterm labor and developing more serious infections in both the mother and baby. °CAUSES °Bacteria germs cause almost all UTIs.  °RISK FACTORS °Many factors can increase your chances of getting a UTI during pregnancy. These include: °· Having a short urethra. °· Poor toilet and hygiene habits. °· Sexual intercourse. °· Blockage of urine along the urinary tract. °· Problems with the pelvic muscles or nerves. °· Diabetes. °· Obesity. °· Bladder problems after having several children. °· Previous history of UTI. °SIGNS AND SYMPTOMS  °· Pain, burning, or a stinging feeling when urinating. °· Suddenly feeling the need to urinate right away (urgency). °· Loss of bladder control (urinary incontinence). °· Frequent urination, more than is common with pregnancy. °· Lower abdominal or back discomfort. °· Cloudy urine. °· Blood in the urine (hematuria). °· Fever.  °When the kidneys are infected, the symptoms may be: °· Back pain. °· Flank pain on the right side more so than the left. °· Fever. °· Chills. °· Nausea. °· Vomiting. °DIAGNOSIS  °A urinary tract infection is usually diagnosed through urine tests. Additional tests and procedures are sometimes done. These may include: °· Ultrasound exam of the kidneys, ureters, bladder, and urethra. °· Looking in the bladder with a lighted tube (cystoscopy). °TREATMENT °Typically, UTIs can be treated with antibiotic medicines.  °HOME CARE INSTRUCTIONS  °· Only take over-the-counter or prescription medicines as directed by your health care provider. If you were prescribed antibiotics, take them as directed. Finish  them even if you start to feel better. °· Drink enough fluids to keep your urine clear or pale yellow. °· Do not have sexual intercourse until the infection is gone and your health care provider says it is okay. °· Make sure you are tested for UTIs throughout your pregnancy. These infections often come back.  °Preventing a UTI in the Future °· Practice good toilet habits. Always wipe from front to back. Use the tissue only once. °· Do not hold your urine. Empty your bladder as soon as possible when the urge comes. °· Do not douche or use deodorant sprays. °· Wash with soap and warm water around the genital area and the anus. °· Empty your bladder before and after sexual intercourse. °· Wear underwear with a cotton crotch. °· Avoid caffeine and carbonated drinks. They can irritate the bladder. °· Drink cranberry juice or take cranberry pills. This may decrease the risk of getting a UTI. °· Do not drink alcohol. °· Keep all your appointments and tests as scheduled.  °SEEK MEDICAL CARE IF:  °· Your symptoms get worse. °· You are still having fevers 2 or more days after treatment begins. °· You have a rash. °· You feel that you are having problems with medicines prescribed. °· You have abnormal vaginal discharge. °SEEK IMMEDIATE MEDICAL CARE IF:  °· You have back or flank pain. °· You have chills. °· You have blood in your urine. °· You have nausea and vomiting. °· You have contractions of your uterus. °· You have a gush of fluid from the vagina. °MAKE SURE YOU: °· Understand these instructions.   °· Will watch your condition.   °· Will get help right away if you are not doing   well or get worse.   °  °This information is not intended to replace advice given to you by your health care provider. Make sure you discuss any questions you have with your health care provider. °  °Document Released: 07/21/2010 Document Revised: 01/14/2013 Document Reviewed: 10/23/2012 °Elsevier Interactive Patient Education ©2016 [employer]  Inc[employer] °Abdominal Pain During Pregnancy °Abdominal pain is common in pregnancy. Most of the time, it does not cause harm. There are many causes of abdominal pain. Some causes are more serious than others. Some of the causes of abdominal pain in pregnancy are easily diagnosed. Occasionally, the diagnosis takes time to understand. Other times, the cause is not determined. Abdominal pain can be a sign that something is very wrong with the pregnancy, or the pain may have nothing to do with the pregnancy at all. For this reason, always tell your health care provider if you have any abdominal discomfort. °HOME CARE INSTRUCTIONS  °Monitor your abdominal pain for any changes. The following actions may help to alleviate any discomfort you are experiencing: °· Do not have sexual intercourse or put anything in your vagina until your symptoms go away completely. °· Get plenty of rest until your pain improves. °· Drink clear fluids if you feel nauseous. Avoid solid food as long as you are uncomfortable or nauseous. °· Only take over-the-counter or prescription medicine as directed by your health care provider. °· Keep all follow-up appointments with your health care provider. °SEEK IMMEDIATE MEDICAL CARE IF: °· You are bleeding, leaking fluid, or passing tissue from the vagina. °· You have increasing pain or cramping. °· You have persistent vomiting. °· You have painful or bloody urination. °· You have a fever. °· You notice a decrease in your baby's movements. °· You have extreme weakness or feel faint. °· You have shortness of breath, with or without abdominal pain. °· You develop a severe headache with abdominal pain. °· You have abnormal vaginal discharge with abdominal pain. °· You have persistent diarrhea. °· You have abdominal pain that continues even after rest, or gets worse. °MAKE SURE YOU:  °· Understand these instructions. °· Will watch your condition. °· Will get help right away if you are not doing well or get  worse. °  °This information is not intended to replace advice given to you by your health care provider. Make sure you discuss any questions you have with your health care provider. °  °Document Released: 03/26/2005 Document Revised: 01/14/2013 Document Reviewed: 10/23/2012 °Elsevier Interactive Patient Education ©2016 Else[employer] Inc. ° °

## 2015-05-02 NOTE — MAU Provider Note (Signed)
History     CSN: [id number]  Arrival date and time: 05/02/15 1129   First Provider Initiated Contact with Patient 05/02/15 1339           Chief Complaint  Patient presents with  . Abdominal Pain  . Back Pain   [name] is a 25 y.o. [phone] at [redacted]w[redacted]d who presents for lower back pain.  Back Pain This is a new problem. The current episode started yesterday. The problem occurs constantly. The problem is unchanged. The pain is present in the lumbar spine. The quality of the pain is described as shooting. Radiates to: lower abdomen. The pain is at a severity of 6/10. Associated symptoms include abdominal pain. Pertinent negatives include no bladder incontinence, bowel incontinence, dysuria, fever, leg pain, paresthesias or pelvic pain. Risk factors include obesity and pregnancy. She has tried nothing for the symptoms.    OB History    Gravida Para Term Preterm AB TAB SAB Ectopic Multiple Living   Past Medical History  Diagnosis Date  . Hypertension   . Preterm labor   . Urinary tract infection   . JYNWGNFA(213.0)     Past Surgical History  Procedure Laterality Date  . Cesarean section    . Dilation and curettage of uterus    . Therapeutic abortion    . 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 Diabetes Mother   . Diabetes Maternal Grandmother   . Hypertension Maternal Grandmother   . Cancer Maternal Grandmother     colon cancer, brain tumour  . Other Neg Hx     Social History  Substance Use Topics  . Smoking status: Former Smoker    Quit date: 01/15/2013  . Smokeless tobacco: Never Used  . Alcohol Use: No    Allergies: No Known Allergies  Prescriptions prior to admission  Medication Sig Dispense Refill Last Dose  . Prenatal Vit-Fe Fumarate-FA (PRENATAL MULTIVITAMIN) TABS tablet Take 1 tablet by mouth daily at 12 noon.   04/18/2015 at Unknown time    Review of Systems   Constitutional: Negative.  Negative for fever.  Gastrointestinal: Positive for abdominal pain. Negative for nausea, vomiting, diarrhea, constipation, blood in stool and bowel incontinence.  Genitourinary: Negative for bladder incontinence, dysuria, frequency, hematuria, flank pain and pelvic pain.       No vaginal bleeding or discharge  Musculoskeletal: Positive for back pain.  Neurological: Negative for paresthesias.   Physical Exam   Blood pressure 135/71, pulse 88, temperature 98.2 F (36.8 C), temperature source Oral, resp. rate 18, height  (1.676 m), weight 235 lb 9.6 oz (106.867 kg), last menstrual period 03/14/2015, unknown if currently breastfeeding.  Physical Exam  Nursing note and vitals reviewed. Constitutional: She is oriented to person, place, and time. She appears well-developed and well-nourished. No distress.  HENT:  Head: Normocephalic and atraumatic.  Eyes: Conjunctivae are normal. Right eye exhibits no discharge. Left eye exhibits no discharge. No scleral icterus.  Neck: Normal range of motion.  Cardiovascular: Normal rate, regular rhythm and normal heart sounds.   No murmur heard. Respiratory: Effort normal and breath sounds normal. No respiratory distress. She has no wheezes.  GI: Soft. Bowel sounds are normal. She exhibits no distension. There is no tenderness. There is no CVA tenderness.  Neurological: She is alert and oriented to person, place, and  time.  Skin: Skin is warm and dry. She is not diaphoretic.  Psychiatric: She has a normal mood and affect. Her behavior is normal. Judgment and thought content normal.    MAU Course  Procedures Results for orders placed or performed during the hospital encounter of 05/02/15 (from the past 24 hour(s))  Urinalysis, Routine w reflex microscopic (not at [facility])     Status: Abnormal   Collection Time: 05/02/15 12:25 PM  Result Value Ref Range   Color, Urine YELLOW YELLOW   APPearance CLEAR CLEAR   Specific  Gravity, Urine 1.020 1.005 - 1.030   pH 6.0 5.0 - 8.0   Glucose, UA NEGATIVE NEGATIVE mg/dL   Hgb urine dipstick NEGATIVE NEGATIVE   Bilirubin Urine NEGATIVE NEGATIVE   Ketones, ur NEGATIVE NEGATIVE mg/dL   Protein, ur NEGATIVE NEGATIVE mg/dL   Nitrite POSITIVE (A) NEGATIVE   Leukocytes, UA NEGATIVE NEGATIVE  Urine microscopic-add on     Status: Abnormal   Collection Time: 05/02/15 12:25 PM  Result Value Ref Range   Squamous Epithelial / LPF 0-5 (A) NONE SEEN   WBC, UA 0-5 0 - 5 WBC/hpf   RBC / HPF 0-5 0 - 5 RBC/hpf   Bacteria, UA MANY (A) NONE SEEN  CBC     Status: None   Collection Time: 05/02/15 12:34 PM  Result Value Ref Range   WBC 7.0 4.0 - 10.5 K/uL   RBC 4.27 3.87 - 5.11 MIL/uL   Hemoglobin 12.5 12.0 - 15.0 g/dL   HCT 16.1 09.6 - 04.5 %   MCV 86.7 78.0 - 100.0 fL   MCH 29.3 26.0 - 34.0 pg   MCHC 33.8 30.0 - 36.0 g/dL   RDW 40.9 81.1 - 91.4 %   Platelets 302 150 - 400 K/uL  ABO/Rh     Status: None   Collection Time: 05/02/15 12:35 PM  Result Value Ref Range   ABO/RH(D) O POS   hCG, quantitative, pregnancy     Status: Abnormal   Collection Time: 05/02/15 12:35 PM  Result Value Ref Range   hCG, Beta Chain, Quant, S 5082 (H) <5 mIU/mL   US Ob Comp Less 14 Wks  05/02/2015  CLINICAL DATA:  Abdominal pain in pregnancy. Gestational age by LMP of 7 weeks 0 days. EXAM: OBSTETRIC <14 WK [place] AND TRANSVAGINAL OB US TECHNIQUE: Both transabdominal and transvaginal ultrasound examinations were performed for complete evaluation of the gestation as well as the maternal uterus, adnexal regions, and pelvic cul-de-sac. Transvaginal technique was performed to assess early pregnancy. COMPARISON:  None. FINDINGS: Intrauterine gestational sac: Visualized/normal in shape. Yolk sac:  Probable yolk sac Embryo:  None visualized MSD: 8  mm   5 w 4 d Subchorionic hemorrhage:  None visualized. Maternal uterus/adnexae: Retroverted uterus. Right ovarian corpus luteum noted. Left ovary is unremarkable in  appearance. Tiny amount of simple free fluid noted in left adnexa, but no definite mass visualized. IMPRESSION: Single intrauterine gestational sac with estimated gestational age of [redacted] weeks 4 days by mean sac diameter. Recommend followup of quantitative beta HCG levels, and consider followup ultrasound to assess viability in 10 days. Electronically Signed   By: Myles Rosenthal M.D.   On: 05/02/2015 13:34   US Ob Transvaginal  05/02/2015  CLINICAL DATA:  Abdominal pain in pregnancy. Gestational age by LMP of 7 weeks 0 days. EXAM: OBSTETRIC <14 WK [place] AND TRANSVAGINAL OB US TECHNIQUE: Both transabdominal and transvaginal ultrasound examinations were performed for complete evaluation of the gestation as well  as the maternal uterus, adnexal regions, and pelvic cul-de-sac. Transvaginal technique was performed to assess early pregnancy. COMPARISON:  None. FINDINGS: Intrauterine gestational sac: Visualized/normal in shape. Yolk sac:  Probable yolk sac Embryo:  None visualized MSD: 8  mm   5 w 4 d Subchorionic hemorrhage:  None visualized. Maternal uterus/adnexae: Retroverted uterus. Right ovarian corpus luteum noted. Left ovary is unremarkable in appearance. Tiny amount of simple free fluid noted in left adnexa, but no definite mass visualized. IMPRESSION: Single intrauterine gestational sac with estimated gestational age of [redacted] weeks 4 days by mean sac diameter. Recommend followup of quantitative beta HCG levels, and consider followup ultrasound to assess viability in 10 days. Electronically Signed   By: Myles Rosenthal M.D.   On: 05/02/2015 13:34    MDM O positive Ultrasound shows IUGS with "probable yolk sac" with recommendations for f/u BHCG OB urine culture sent  Patient declines pelvic exam d/t time constraints. Plans on f/u with Dr. Clearance Coots & has first prenatal visit scheduled for next month.   Assessment and Plan  A: 1. Urinary tract infection without hematuria, site unspecified   2. Abdominal pain in pregnancy     P: Discharge home Rx keflex & pyridium Ob urine culture pending F/u in Clinic 1/25 @ 11 am for f/u bhcg Discussed reasons to return.   Judeth Horn, NP  05/02/2015, 1:38 PM

## 2015-05-02 NOTE — MAU Note (Signed)
Not in lobby

## 2015-05-02 NOTE — MAU Note (Signed)
Sharp shooting pains in lower abd and lower back.  Started yesterday, just after noon.  Had gone to the beach and was in a hot tub, back pain started about after she got out. abd pain started later in the day.

## 2015-05-03 LAB — HIV ANTIBODY (ROUTINE TESTING W REFLEX): HIV Screen 4th Generation wRfx: NONREACTIVE

## 2015-05-04 ENCOUNTER — Ambulatory Visit: Payer: Medicaid Other | Admitting: Advanced Practice Midwife

## 2015-05-04 LAB — CULTURE, OB URINE: Culture: >=100000

## 2015-05-05 ENCOUNTER — Encounter: Payer: Self-pay | Admitting: General Practice

## 2015-05-05 ENCOUNTER — Telehealth: Payer: Self-pay

## 2015-05-05 ENCOUNTER — Ambulatory Visit (INDEPENDENT_AMBULATORY_CARE_PROVIDER_SITE_OTHER): Payer: Medicaid Other | Admitting: Obstetrics & Gynecology

## 2015-05-05 ENCOUNTER — Telehealth: Payer: Self-pay | Admitting: General Practice

## 2015-05-05 ENCOUNTER — Encounter: Payer: Self-pay | Admitting: Obstetrics & Gynecology

## 2015-05-05 DIAGNOSIS — O9989 Other specified diseases and conditions complicating pregnancy, childbirth and the puerperium: Secondary | ICD-10-CM

## 2015-05-05 DIAGNOSIS — O3680X0 Pregnancy with inconclusive fetal viability, not applicable or unspecified: Secondary | ICD-10-CM

## 2015-05-05 DIAGNOSIS — R109 Unspecified abdominal pain: Secondary | ICD-10-CM

## 2015-05-05 DIAGNOSIS — Z3491 Encounter for supervision of normal pregnancy, unspecified, first trimester: Secondary | ICD-10-CM | POA: Insufficient documentation

## 2015-05-05 LAB — HCG, QUANTITATIVE, PREGNANCY: HCG, BETA CHAIN, QUANT, S: 6978 m[IU]/mL — AB (ref <5)

## 2015-05-05 NOTE — Telephone Encounter (Signed)
Error

## 2015-05-05 NOTE — Progress Notes (Signed)
[name]  is a 25 y.o. [id number] at [redacted]w[redacted]d who presents to the [facility] today for follow-up quant hCG after 72 hours. The patient was seen in MAU on 1.23.17 and had quant hCG of 5082 and US showed gestational sac no pole. She denies/endorses no pain, vaginal bleeding or fever today.   OB History  Gravida Para Term Preterm AB SAB TAB Ectopic Multiple Living  # Outcome Date GA Lbr Len/2nd Weight Sex Delivery Anes PTL Lv  4 Current           3A Preterm 05/17/10     CS-LTranv  Y      Comments: PIH  3B Preterm 05/17/10       Y   2 SAB           1 TAB               Past Medical History  Diagnosis Date  . Hypertension   . Preterm labor   . Urinary tract infection   . Headache(784.0)      LMP 03/14/2015  CONSTITUTIONAL: Well-developed, well-nourished female in no acute distress.  ENT: External right and left ear normal.  EYES: EOM intact, conjunctivae normal.  MUSCULOSKELETAL: Normal range of motion.  CARDIOVASCULAR: Regular heart rate RESPIRATORY: Normal effort NEUROLOGICAL: Alert and oriented to person, place, and time. Marland Kitchen PSYCH: Normal mood and affect. Normal behavior. Normal judgment and thought content.  Results for orders placed or performed in visit on 05/05/15 (from the past 24 hour(s))  hCG, quantitative, pregnancy     Status: Abnormal   Collection Time: 05/05/15 11:12 AM  Result Value Ref Range   hCG, Beta Chain, Quant, S 6978 (H) <5 mIU/mL    A: Appropriate borderline rise in quant hCG after 48 hours  P: Discharge home First trimester/ectopic precautions discussed Patient will return for follow-up US in 1 week. Order placed and RN to schedule. Patient will f/u with Dr Clearance Coots for scheduled [facility]  Patient may return to MAU as needed or if her condition were to change or worsen   Adam Phenix, MD 05/05/2015 1:23 PM

## 2015-05-05 NOTE — Telephone Encounter (Signed)
Patient no showed for bhcg follow up yesterday. Called patient, no answer- left message stating we are trying to reach you in regards to a missed appt in our office yesterday. Please contact the front office staff to reschedule this appt.

## 2015-05-12 ENCOUNTER — Ambulatory Visit (HOSPITAL_COMMUNITY): Admission: RE | Admit: 2015-05-12 | Payer: Medicaid Other | Source: Ambulatory Visit

## 2015-05-18 ENCOUNTER — Inpatient Hospital Stay (HOSPITAL_COMMUNITY)
Admission: AD | Admit: 2015-05-18 | Discharge: 2015-05-18 | Disposition: A | Discharge status: Home / Self Care | Payer: Medicaid Other | Source: Ambulatory Visit | Attending: Obstetrics & Gynecology | Admitting: Obstetrics & Gynecology

## 2015-05-18 ENCOUNTER — Inpatient Hospital Stay (HOSPITAL_COMMUNITY): Payer: Medicaid Other

## 2015-05-18 ENCOUNTER — Encounter (HOSPITAL_COMMUNITY): Payer: Self-pay | Admitting: *Deleted

## 2015-05-18 DIAGNOSIS — Z331 Pregnant state, incidental: Secondary | ICD-10-CM | POA: Diagnosis present

## 2015-05-18 DIAGNOSIS — R109 Unspecified abdominal pain: Secondary | ICD-10-CM

## 2015-05-18 DIAGNOSIS — Z3A01 Less than 8 weeks gestation of pregnancy: Secondary | ICD-10-CM | POA: Insufficient documentation

## 2015-05-18 DIAGNOSIS — O9989 Other specified diseases and conditions complicating pregnancy, childbirth and the puerperium: Secondary | ICD-10-CM | POA: Diagnosis not present

## 2015-05-18 DIAGNOSIS — O3680X0 Pregnancy with inconclusive fetal viability, not applicable or unspecified: Secondary | ICD-10-CM

## 2015-05-18 DIAGNOSIS — O26899 Other specified pregnancy related conditions, unspecified trimester: Secondary | ICD-10-CM

## 2015-05-18 LAB — URINALYSIS, ROUTINE W REFLEX MICROSCOPIC
Bilirubin Urine: NEGATIVE
GLUCOSE, UA: NEGATIVE mg/dL
Hgb urine dipstick: NEGATIVE
Ketones, ur: NEGATIVE mg/dL
LEUKOCYTES UA: NEGATIVE
Nitrite: NEGATIVE
PH: 6 (ref 5.0–8.0)
Protein, ur: NEGATIVE mg/dL
SPECIFIC GRAVITY, URINE: 1.02 (ref 1.005–1.030)

## 2015-05-18 LAB — HCG, QUANTITATIVE, PREGNANCY: hCG, Beta Chain, Quant, S: 23824 m[IU]/mL — ABNORMAL HIGH (ref <5)

## 2015-05-18 NOTE — MAU Note (Signed)
Pt states she had an appt with the clinic today at 0900 but when she arrived they didn't have her down for an appt.  Pt states "her numbers are high and that she can miscarriage 48-72 hours".  Pt was seen last week (possibly last Monday).  Denies vaginal bleeding or abnormal discharge.  Pt states the "lower abd pain has been going on awhile and can't given specific date."

## 2015-05-18 NOTE — Discharge Instructions (Signed)
Prenatal Care °WHAT IS PRENATAL CARE?  °Prenatal care is the process of caring for a pregnant woman before she gives birth. Prenatal care makes sure that she and her baby remain as healthy as possible throughout pregnancy. Prenatal care may be provided by a midwife, family practice health care provider, or a childbirth and pregnancy specialist (obstetrician). Prenatal care may include physical examinations, testing, treatments, and education on nutrition, lifestyle, and social support services. °WHY IS PRENATAL CARE SO IMPORTANT?  °Early and consistent prenatal care increases the chance that you and your baby will remain healthy throughout your pregnancy. This type of care also decreases a baby's risk of being born too early (prematurely), or being born smaller than expected (small for gestational age). Any underlying medical conditions you may have that could pose a risk during your pregnancy are discussed during prenatal care visits. You will also be monitored regularly for any new conditions that may arise during your pregnancy so they can be treated quickly and effectively. °WHAT HAPPENS DURING PRENATAL CARE VISITS? °Prenatal care visits may include the following: °Discussion °Tell your health care provider about any new signs or symptoms you have experienced since your last visit. These might include: °· Nausea or vomiting. °· Increased or decreased level of energy. °· Difficulty sleeping. °· Back or leg pain. °· Weight changes. °· Frequent urination. °· Shortness of breath with physical activity. °· Changes in your skin, such as the development of a rash or itchiness. °· Vaginal discharge or bleeding. °· Feelings of excitement or nervousness. °· Changes in your baby's movements. °You may want to write down any questions or topics you want to discuss with your health care provider and bring them with you to your appointment. °Examination °During your first prenatal care visit, you will likely have a complete  physical exam. Your health care provider will often examine your vagina, cervix, and the position of your uterus, as well as check your heart, lungs, and other body systems. As your pregnancy progresses, your health care provider will measure the size of your uterus and your baby's position inside your uterus. He or she may also examine you for early signs of labor. Your prenatal visits may also include checking your blood pressure and, after about 10-12 weeks of pregnancy, listening to your baby's heartbeat. °Testing °Regular testing often includes: °· Urinalysis. This checks your urine for glucose, protein, or signs of infection. °· Blood count. This checks the levels of white and red blood cells in your body. °· Tests for sexually transmitted infections (STIs). Testing for STIs at the beginning of pregnancy is routinely done and is required in many states. °· Antibody testing. You will be checked to see if you are immune to certain illnesses, such as rubella, that can affect a developing fetus. °· Glucose screen. Around 24-28 weeks of pregnancy, your blood glucose level will be checked for signs of gestational diabetes. Follow-up tests may be recommended. °· Group B strep. This is a bacteria that is commonly found inside a woman's vagina. This test will inform your health care provider if you need an antibiotic to reduce the amount of this bacteria in your body prior to labor and childbirth. °· Ultrasound. Many pregnant women undergo an ultrasound screening around 18-20 weeks of pregnancy to evaluate the health of the fetus and check for any developmental abnormalities. °· HIV (human immunodeficiency virus) testing. Early in your pregnancy, you will be screened for HIV. If you are at high risk for HIV, this test   may be repeated during your third trimester of pregnancy. °You may be offered other testing based on your age, personal or family medical history, or other factors.  °HOW OFTEN SHOULD I PLAN TO SEE MY  HEALTH CARE PROVIDER FOR PRENATAL CARE? °Your prenatal care check-up schedule depends on any medical conditions you have before, or develop during, your pregnancy. If you do not have any underlying medical conditions, you will likely be seen for checkups: °· Monthly, during the first 6 months of pregnancy. °· Twice a month during months 7 and 8 of pregnancy. °· Weekly starting in the 9th month of pregnancy and until delivery. °If you develop signs of early labor or other concerning signs or symptoms, you may need to see your health care provider more often. Ask your health care provider what prenatal care schedule is best for you. °WHAT CAN I DO TO KEEP MYSELF AND MY BABY AS HEALTHY AS POSSIBLE DURING MY PREGNANCY? °· Take a prenatal vitamin containing 400 micrograms (0.4 mg) of folic acid every day. Your health care provider may also ask you to take additional vitamins such as iodine, vitamin D, iron, copper, and zinc. °· Take 1500-2000 mg of calcium daily starting at your 20th week of pregnancy until you deliver your baby. °· Make sure you are up to date on your vaccinations. Unless directed otherwise by your health care provider: °¨ You should receive a tetanus, diphtheria, and pertussis (Tdap) vaccination between the 27th and 36th week of your pregnancy, regardless of when your last Tdap immunization occurred. This helps protect your baby from whooping cough (pertussis) after he or she is born. °¨ You should receive an annual inactivated influenza vaccine (IIV) to help protect you and your baby from influenza. This can be done at any point during your pregnancy. °· Eat a well-rounded diet that includes: °¨ Fresh fruits and vegetables. °¨ Lean proteins. °¨ Calcium-rich foods such as milk, yogurt, hard cheeses, and dark, leafy greens. °¨ Whole grain breads. °· Do not eat seafood high in mercury, including: °¨ Swordfish. °¨ Tilefish. °¨ Shark. °¨ King mackerel. °¨ More than 6 oz tuna per week. °· Do not eat: °¨ Raw  or undercooked meats or eggs. °¨ Unpasteurized foods, such as soft cheeses (brie, blue, or feta), juices, and milks. °¨ Lunch meats. °¨ Hot dogs that have not been heated until they are steaming. °· Drink enough water to keep your urine clear or pale yellow. For many women, this may be 10 or more 8 oz glasses of water each day. Keeping yourself hydrated helps deliver nutrients to your baby and may prevent the start of pre-term uterine contractions. °· Do not use any tobacco products including cigarettes, chewing tobacco, or electronic cigarettes. If you need help quitting, ask your health care provider. °· Do not drink beverages containing alcohol. No safe level of alcohol consumption during pregnancy has been determined. °· Do not use any illegal drugs. These can harm your developing baby or cause a miscarriage. °· Ask your health care provider or pharmacist before taking any prescription or over-the-counter medicines, herbs, or supplements. °· Limit your caffeine intake to no more than 200 mg per day. °· Exercise. Unless told otherwise by your health care provider, try to get 30 minutes of moderate exercise most days of the week. Do not  do high-impact activities, contact sports, or activities with a high risk of falling, such as horseback riding or downhill skiing. °· Get plenty of rest. °· Avoid anything that raises your   body temperature, such as hot tubs and saunas.  If you own a cat, do not empty its litter box. Bacteria contained in cat feces can cause an infection called toxoplasmosis. This can result in serious harm to the fetus.  Stay away from chemicals such as insecticides, lead, mercury, and cleaning or paint products that contain solvents.  Do not have any X-rays taken unless medically necessary.  Take a childbirth and breastfeeding preparation class. Ask your health care provider if you need a referral or recommendation.   This information is not intended to replace advice given to you by  your health care provider. Make sure you discuss any questions you have with your health care provider.   Document Released: 03/29/2003 Document Revised: 04/16/2014 Document Reviewed: 06/10/2013 Elsevier Interactive Patient Education 2016 [employer].  Prenatal Care WHAT IS PRENATAL CARE?  Prenatal care is the process of caring for a pregnant woman before she gives birth. Prenatal care makes sure that she and her baby remain as healthy as possible throughout pregnancy. Prenatal care may be provided by a midwife, family practice health care provider, or a childbirth and pregnancy specialist (obstetrician). Prenatal care may include physical examinations, testing, treatments, and education on nutrition, lifestyle, and social support services. WHY IS PRENATAL CARE SO IMPORTANT?  Early and consistent prenatal care increases the chance that you and your baby will remain healthy throughout your pregnancy. This type of care also decreases a baby's risk of being born too early (prematurely), or being born smaller than expected (small for gestational age). Any underlying medical conditions you may have that could pose a risk during your pregnancy are discussed during prenatal care visits. You will also be monitored regularly for any new conditions that may arise during your pregnancy so they can be treated quickly and effectively. WHAT HAPPENS DURING PRENATAL CARE VISITS? Prenatal care visits may include the following: Discussion Tell your health care provider about any new signs or symptoms you have experienced since your last visit. These might include:  Nausea or vomiting.  Increased or decreased level of energy.  Difficulty sleeping.  Back or leg pain.  Weight changes.  Frequent urination.  Shortness of breath with physical activity.  Changes in your skin, such as the development of a rash or itchiness.  Vaginal discharge or bleeding.  Feelings of excitement or nervousness.  Changes  in your baby's movements. You may want to write down any questions or topics you want to discuss with your health care provider and bring them with you to your appointment. Examination During your first prenatal care visit, you will likely have a complete physical exam. Your health care provider will often examine your vagina, cervix, and the position of your uterus, as well as check your heart, lungs, and other body systems. As your pregnancy progresses, your health care provider will measure the size of your uterus and your baby's position inside your uterus. He or she may also examine you for early signs of labor. Your prenatal visits may also include checking your blood pressure and, after about 10-12 weeks of pregnancy, listening to your baby's heartbeat. Testing Regular testing often includes:  Urinalysis. This checks your urine for glucose, protein, or signs of infection.  Blood count. This checks the levels of white and red blood cells in your body.  Tests for sexually transmitted infections (STIs). Testing for STIs at the beginning of pregnancy is routinely done and is required in many states.  Antibody testing. You will be checked to  see if you are immune to certain illnesses, such as rubella, that can affect a developing fetus.  Glucose screen. Around 24-28 weeks of pregnancy, your blood glucose level will be checked for signs of gestational diabetes. Follow-up tests may be recommended.  Group B strep. This is a bacteria that is commonly found inside a woman's vagina. This test will inform your health care provider if you need an antibiotic to reduce the amount of this bacteria in your body prior to labor and childbirth.  Ultrasound. Many pregnant women undergo an ultrasound screening around 18-20 weeks of pregnancy to evaluate the health of the fetus and check for any developmental abnormalities.  HIV (human immunodeficiency virus) testing. Early in your pregnancy, you will be  screened for HIV. If you are at high risk for HIV, this test may be repeated during your third trimester of pregnancy. You may be offered other testing based on your age, personal or family medical history, or other factors.  HOW OFTEN SHOULD I PLAN TO SEE MY HEALTH CARE PROVIDER FOR PRENATAL CARE? Your prenatal care check-up schedule depends on any medical conditions you have before, or develop during, your pregnancy. If you do not have any underlying medical conditions, you will likely be seen for checkups:  Monthly, during the first 6 months of pregnancy.  Twice a month during months 7 and 8 of pregnancy.  Weekly starting in the 9th month of pregnancy and until delivery. If you develop signs of early labor or other concerning signs or symptoms, you may need to see your health care provider more often. Ask your health care provider what prenatal care schedule is best for you. WHAT CAN I DO TO KEEP MYSELF AND MY BABY AS HEALTHY AS POSSIBLE DURING MY PREGNANCY?  Take a prenatal vitamin containing 400 micrograms (0.4 mg) of folic acid every day. Your health care provider may also ask you to take additional vitamins such as iodine, vitamin D, iron, copper, and zinc.  Take 1500-2000 mg of calcium daily starting at your 20th week of pregnancy until you deliver your baby.  Make sure you are up to date on your vaccinations. Unless directed otherwise by your health care provider:  You should receive a tetanus, diphtheria, and pertussis (Tdap) vaccination between the 27th and 36th week of your pregnancy, regardless of when your last Tdap immunization occurred. This helps protect your baby from whooping cough (pertussis) after he or she is born.  You should receive an annual inactivated influenza vaccine (IIV) to help protect you and your baby from influenza. This can be done at any point during your pregnancy.  Eat a well-rounded diet that includes:  Fresh fruits and vegetables.  Lean  proteins.  Calcium-rich foods such as milk, yogurt, hard cheeses, and dark, leafy greens.  Whole grain breads.  Do noteat seafood high in mercury, including:  Swordfish.  Tilefish.  Shark.  King mackerel.  More than 6 oz tuna per week.  Do not eat:  Raw or undercooked meats or eggs.  Unpasteurized foods, such as soft cheeses (brie, blue, or feta), juices, and milks.  Lunch meats.  Hot dogs that have not been heated until they are steaming.  Drink enough water to keep your urine clear or pale yellow. For many women, this may be 10 or more 8 oz glasses of water each day. Keeping yourself hydrated helps deliver nutrients to your baby and may prevent the start of pre-term uterine contractions.  Do not use any tobacco products including  cigarettes, chewing tobacco, or electronic cigarettes. If you need help quitting, ask your health care provider.  Do not drink beverages containing alcohol. No safe level of alcohol consumption during pregnancy has been determined.  Do not use any illegal drugs. These can harm your developing baby or cause a miscarriage.  Ask your health care provider or pharmacist before taking any prescription or over-the-counter medicines, herbs, or supplements.  Limit your caffeine intake to no more than 200 mg per day.  Exercise. Unless told otherwise by your health care provider, try to get 30 minutes of moderate exercise most days of the week. Do not  do high-impact activities, contact sports, or activities with a high risk of falling, such as horseback riding or downhill skiing.  Get plenty of rest.  Avoid anything that raises your body temperature, such as hot tubs and saunas.  If you own a cat, do not empty its litter box. Bacteria contained in cat feces can cause an infection called toxoplasmosis. This can result in serious harm to the fetus.  Stay away from chemicals such as insecticides, lead, mercury, and cleaning or paint products that  contain solvents.  Do not have any X-rays taken unless medically necessary.  Take a childbirth and breastfeeding preparation class. Ask your health care provider if you need a referral or recommendation.   This information is not intended to replace advice given to you by your health care provider. Make sure you discuss any questions you have with your health care provider.   Document Released: 03/29/2003 Document Revised: 04/16/2014 Document Reviewed: 06/10/2013 Elsevier Interactive Patient Education [employer]

## 2015-05-22 ENCOUNTER — Encounter (HOSPITAL_COMMUNITY): Payer: Self-pay | Admitting: *Deleted

## 2015-05-22 ENCOUNTER — Inpatient Hospital Stay (HOSPITAL_COMMUNITY)
Admission: AD | Admit: 2015-05-22 | Discharge: 2015-05-22 | Discharge status: Against Medical Advice | Payer: Medicaid Other | Source: Ambulatory Visit | Attending: Obstetrics | Admitting: Obstetrics

## 2015-05-22 DIAGNOSIS — R109 Unspecified abdominal pain: Secondary | ICD-10-CM | POA: Diagnosis present

## 2015-05-22 DIAGNOSIS — Z5320 Procedure and treatment not carried out because of patient's decision for unspecified reasons: Secondary | ICD-10-CM | POA: Diagnosis not present

## 2015-05-22 LAB — URINE MICROSCOPIC-ADD ON

## 2015-05-22 LAB — URINALYSIS, ROUTINE W REFLEX MICROSCOPIC
Bilirubin Urine: NEGATIVE
Glucose, UA: NEGATIVE mg/dL
Hgb urine dipstick: NEGATIVE
Ketones, ur: NEGATIVE mg/dL
LEUKOCYTES UA: NEGATIVE
Nitrite: POSITIVE — AB
PROTEIN: NEGATIVE mg/dL
SPECIFIC GRAVITY, URINE: 1.025 (ref 1.005–1.030)
pH: 5.5 (ref 5.0–8.0)

## 2015-05-22 NOTE — MAU Note (Signed)
Patient stated she would wait and speak with NP. However, @ approximately 1605, patient was seen dressed and walking out of MAU.

## 2015-05-22 NOTE — MAU Note (Signed)
Patient C/O intermittent pain in upper and lower abd, cramping, aching. Denies unusual vaginal D/C or bleeding. States she does not want an U/S because "they keep telling me there is a sac, but no baby." Patient states she just wants [place] to listen to the heart beat and she will know the baby is Ok. Explained to patient that FHT's can not be heard with doppler until after 10wks. Patient stated she would just leave and come back tomorrow. Informed patient that there is no guarantee FHR will be heard with doppler tomorrow. Encouraged patient to speak with NP about her concerns.

## 2015-05-27 ENCOUNTER — Inpatient Hospital Stay (HOSPITAL_COMMUNITY)
Admission: AD | Admit: 2015-05-27 | Discharge: 2015-05-27 | Discharge status: Against Medical Advice | Payer: Medicaid Other | Source: Ambulatory Visit | Attending: Obstetrics | Admitting: Obstetrics

## 2015-05-27 ENCOUNTER — Other Ambulatory Visit: Payer: Self-pay | Admitting: Obstetrics

## 2015-05-27 ENCOUNTER — Encounter (HOSPITAL_COMMUNITY): Payer: Self-pay | Admitting: *Deleted

## 2015-05-27 DIAGNOSIS — Z87440 Personal history of urinary (tract) infections: Secondary | ICD-10-CM | POA: Diagnosis not present

## 2015-05-27 DIAGNOSIS — O9989 Other specified diseases and conditions complicating pregnancy, childbirth and the puerperium: Secondary | ICD-10-CM | POA: Diagnosis not present

## 2015-05-27 DIAGNOSIS — Z3A01 Less than 8 weeks gestation of pregnancy: Secondary | ICD-10-CM | POA: Diagnosis not present

## 2015-05-27 DIAGNOSIS — Z833 Family history of diabetes mellitus: Secondary | ICD-10-CM | POA: Diagnosis not present

## 2015-05-27 DIAGNOSIS — N390 Urinary tract infection, site not specified: Secondary | ICD-10-CM

## 2015-05-27 DIAGNOSIS — Z87891 Personal history of nicotine dependence: Secondary | ICD-10-CM | POA: Insufficient documentation

## 2015-05-27 DIAGNOSIS — R109 Unspecified abdominal pain: Secondary | ICD-10-CM | POA: Diagnosis not present

## 2015-05-27 DIAGNOSIS — Z5321 Procedure and treatment not carried out due to patient leaving prior to being seen by health care provider: Secondary | ICD-10-CM | POA: Insufficient documentation

## 2015-05-27 DIAGNOSIS — O26899 Other specified pregnancy related conditions, unspecified trimester: Secondary | ICD-10-CM

## 2015-05-27 LAB — URINE MICROSCOPIC-ADD ON: RBC / HPF: NONE SEEN RBC/hpf (ref 0–5)

## 2015-05-27 LAB — URINALYSIS, ROUTINE W REFLEX MICROSCOPIC
BILIRUBIN URINE: NEGATIVE
Glucose, UA: NEGATIVE mg/dL
Hgb urine dipstick: NEGATIVE
KETONES UR: NEGATIVE mg/dL
LEUKOCYTES UA: NEGATIVE
NITRITE: POSITIVE — AB
PROTEIN: NEGATIVE mg/dL
Specific Gravity, Urine: 1.025 (ref 1.005–1.030)
pH: 6 (ref 5.0–8.0)

## 2015-05-27 MED ORDER — NITROFURANTOIN MONOHYD MACRO 100 MG PO CAPS: 100.0000 mg | ORAL_CAPSULE | Freq: Two times a day (BID) | ORAL | Status: DC

## 2015-05-27 NOTE — MAU Note (Signed)
At 1205 patient stated to pharmacy tech that she was leaving. RN was at nurses station on phone in regards to another patient. Pharmacy tech relayed message from patient and patient already gone.

## 2015-05-27 NOTE — MAU Provider Note (Signed)
  History     CSN: [id number]  Arrival date and time: 05/27/15 1055   First Provider Initiated Contact with Patient 05/27/15 1140      Chief Complaint  Patient presents with  . Abdominal Pain   HPI [name] 25 y.o. [id number] @ [redacted]w[redacted]d presents to the MAU with abdominal cramping. She denies vaginal bleeding.   Past Medical History  Diagnosis Date  . Hypertension   . Preterm labor   . Urinary tract infection   . JYNWGNFA(213.0)     Past Surgical History  Procedure Laterality Date  . Cesarean section    . Dilation and curettage of uterus    . Therapeutic abortion    . 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 Diabetes Mother   . Diabetes Maternal Grandmother   . Hypertension Maternal Grandmother   . Cancer Maternal Grandmother     colon cancer, brain tumour  . Other Neg Hx     Social History  Substance Use Topics  . Smoking status: Former Smoker    Quit date: 01/15/2013  . Smokeless tobacco: Never Used  . Alcohol Use: No    Allergies: No Known Allergies  Prescriptions prior to admission  Medication Sig Dispense Refill Last Dose  . Prenatal Vit-Fe Fumarate-FA (PRENATAL MULTIVITAMIN) TABS tablet Take 1 tablet by mouth daily at 12 noon.   05/27/2015 at Unknown time    Review of Systems  Constitutional: Negative for fever.  Gastrointestinal: Positive for abdominal pain.  All other systems reviewed and are negative.  Physical Exam   Blood pressure 111/54, pulse 87, temperature 97.8 F (36.6 C), temperature source Oral, resp. rate 18, height  (1.651 m), weight 107.219 kg (236 lb 6 oz), last menstrual period 03/14/2015, SpO2 100 %, unknown if currently breastfeeding.  Physical Exam  Nursing note and vitals reviewed. Constitutional: She is oriented to person, place, and time. She appears well-developed and well-nourished. No distress.  HENT:  Head: Normocephalic and atraumatic.   Cardiovascular: Normal rate.   Respiratory: Effort normal. No respiratory distress.  GI: Soft.  Musculoskeletal: Normal range of motion.  Neurological: She is alert and oriented to person, place, and time.  Skin: Skin is warm and dry.  Psychiatric: She has a normal mood and affect. Her behavior is normal. Judgment and thought content normal.    MAU Course  Procedures  MDM Pt walked out AMA before ultrasound was performed. Did not give any reason to RN.  Assessment and Plan    [name] 05/27/2015, 11:56 AM

## 2015-05-27 NOTE — MAU Note (Signed)
"  Stomach pain" for the last 2 days. Denies any bleeding, nausea, vomiting or diarrhea.

## 2015-05-31 ENCOUNTER — Encounter: Payer: Medicaid Other | Admitting: Certified Nurse Midwife

## 2015-06-02 ENCOUNTER — Ambulatory Visit (INDEPENDENT_AMBULATORY_CARE_PROVIDER_SITE_OTHER): Payer: Medicaid Other | Admitting: Certified Nurse Midwife

## 2015-06-02 ENCOUNTER — Telehealth: Payer: Self-pay | Admitting: Obstetrics

## 2015-06-02 VITALS — BP 124/78 | HR 80 | Wt 238.0 lb

## 2015-06-02 DIAGNOSIS — Z3481 Encounter for supervision of other normal pregnancy, first trimester: Secondary | ICD-10-CM

## 2015-06-02 DIAGNOSIS — Z1389 Encounter for screening for other disorder: Secondary | ICD-10-CM

## 2015-06-02 DIAGNOSIS — Z331 Pregnant state, incidental: Secondary | ICD-10-CM | POA: Diagnosis not present

## 2015-06-02 LAB — POCT URINALYSIS DIPSTICK
BILIRUBIN UA: NEGATIVE
GLUCOSE UA: NEGATIVE
Ketones, UA: NEGATIVE
Leukocytes, UA: NEGATIVE
Nitrite, UA: NEGATIVE
Protein, UA: NEGATIVE
RBC UA: NEGATIVE
SPEC GRAV UA: 1.015
UROBILINOGEN UA: NEGATIVE
pH, UA: 5.5

## 2015-06-03 ENCOUNTER — Encounter (HOSPITAL_COMMUNITY): Payer: Self-pay | Admitting: *Deleted

## 2015-06-03 ENCOUNTER — Inpatient Hospital Stay (HOSPITAL_COMMUNITY): Payer: Medicaid Other

## 2015-06-03 ENCOUNTER — Inpatient Hospital Stay (HOSPITAL_COMMUNITY)
Admission: AD | Admit: 2015-06-03 | Discharge: 2015-06-03 | Disposition: A | Discharge status: Home / Self Care | Payer: Medicaid Other | Source: Ambulatory Visit | Attending: Obstetrics | Admitting: Obstetrics

## 2015-06-03 DIAGNOSIS — Z87891 Personal history of nicotine dependence: Secondary | ICD-10-CM | POA: Diagnosis not present

## 2015-06-03 DIAGNOSIS — Z3A08 8 weeks gestation of pregnancy: Secondary | ICD-10-CM | POA: Diagnosis not present

## 2015-06-03 DIAGNOSIS — I10 Essential (primary) hypertension: Secondary | ICD-10-CM | POA: Insufficient documentation

## 2015-06-03 DIAGNOSIS — N939 Abnormal uterine and vaginal bleeding, unspecified: Secondary | ICD-10-CM | POA: Diagnosis present

## 2015-06-03 DIAGNOSIS — O209 Hemorrhage in early pregnancy, unspecified: Secondary | ICD-10-CM

## 2015-06-03 DIAGNOSIS — O021 Missed abortion: Secondary | ICD-10-CM

## 2015-06-03 LAB — URINALYSIS, ROUTINE W REFLEX MICROSCOPIC
Bilirubin Urine: NEGATIVE
Glucose, UA: NEGATIVE mg/dL
KETONES UR: NEGATIVE mg/dL
LEUKOCYTES UA: NEGATIVE
NITRITE: NEGATIVE
PH: 5.5 (ref 5.0–8.0)
PROTEIN: NEGATIVE mg/dL
Specific Gravity, Urine: 1.02 (ref 1.005–1.030)

## 2015-06-03 LAB — CBC
HCT: 35.1 % — ABNORMAL LOW (ref 36.0–46.0)
HEMOGLOBIN: 11.8 g/dL — AB (ref 12.0–15.0)
MCH: 29.3 pg (ref 26.0–34.0)
MCHC: 33.6 g/dL (ref 30.0–36.0)
MCV: 87.1 fL (ref 78.0–100.0)
Platelets: 279 10*3/uL (ref 150–400)
RBC: 4.03 MIL/uL (ref 3.87–5.11)
RDW: 13 % (ref 11.5–15.5)
WBC: 8.6 10*3/uL (ref 4.0–10.5)

## 2015-06-03 LAB — WET PREP, GENITAL
SPERM: NONE SEEN
TRICH WET PREP: NONE SEEN

## 2015-06-03 LAB — URINE MICROSCOPIC-ADD ON

## 2015-06-03 NOTE — MAU Provider Note (Signed)
Chief Complaint: Vaginal Bleeding and Abdominal Cramping   First Provider Initiated Contact with Patient 06/03/15 1852      SUBJECTIVE HPI: [name] is a 25 y.o. [id number] at [redacted]w[redacted]d by LMP who presents to maternity admissions reporting onset of pink bleeding when wiping this afternoon. She has not tried anything for bleeding, nothing makes it better or worse.  It is not associated with other symptoms.  She reports a pain in her mid right abdomen earlier today but it resolved without treatment and is not present now.  She was at initial OB visit yesterday and left prior to being seen by CNM.  She was also in MAU on 2/17 and left prior to eval by NP.  She had [place] on 2/8 with gestational sac and yolk sac confirming IUP at [redacted]w[redacted]d with small subchorionic hemorrhage.   She denies vaginal itching/burning, urinary symptoms, h/a, dizziness, n/v, or fever/chills.     HPI  Past Medical History  Diagnosis Date  . Hypertension   . Preterm labor   . Urinary tract infection   . AVWUJWJX(914.7)    Past Surgical History  Procedure Laterality Date  . Cesarean section    . Dilation and curettage of uterus    . Therapeutic abortion    . 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    Removal;right   Social History   Social History  . Marital Status: Single    Spouse Name: N/A  . Number of Children: N/A  . Years of Education: N/A   Occupational History  . None    Social History Main Topics  . Smoking status: Former Smoker    Quit date: 01/15/2013  . Smokeless tobacco: Never Used  . Alcohol Use: No  . Drug Use: No     Comment: daily , recently started 2 months prior  . Sexual Activity: Yes    Birth Control/ Protection: None   Other Topics Concern  . Not on file   Social History Narrative   No current facility-administered medications on file prior to encounter.   Current Outpatient Prescriptions on File Prior to Encounter  Medication Sig Dispense Refill  . Prenatal Vit-Fe Fumarate-FA  (PRENATAL MULTIVITAMIN) TABS tablet Take 1 tablet by mouth daily at 12 noon.     No Known Allergies  ROS:  Review of Systems  Constitutional: Negative for fever, chills and fatigue.  Respiratory: Negative for shortness of breath.   Cardiovascular: Negative for chest pain.  Gastrointestinal: Negative for abdominal pain.  Genitourinary: Positive for vaginal bleeding. Negative for dysuria, flank pain, vaginal discharge, difficulty urinating, vaginal pain and pelvic pain.  Neurological: Negative for dizziness and headaches.  Psychiatric/Behavioral: Negative.      I have reviewed patient's Past Medical Hx, Surgical Hx, Family Hx, Social Hx, medications and allergies.   Physical Exam   Patient Vitals for the past 24 hrs:  BP Temp Temp src Pulse Resp  06/03/15 1829 109/70 mmHg 98.8 F (37.1 C) Oral 92 18   Constitutional: Well-developed, well-nourished female in no acute distress.  Cardiovascular: normal rate Respiratory: normal effort GI: Abd soft, non-tender. Pos BS x 4 MS: Extremities nontender, no edema, normal ROM Neurologic: Alert and oriented x 4.  GU: Neg CVAT.  PELVIC EXAM: Cervix pink, visually closed, without lesion, small amount light red bleeding, vaginal walls and external genitalia normal Bimanual exam: Cervix 0/long/high, firm, anterior, neg CMT, uterus nontender, nonenlarged, adnexa without tenderness, enlargement, or mass  LAB RESULTS Results for orders placed or performed during the  hospital encounter of 06/03/15 (from the past 24 hour(s))  Urinalysis, Routine w reflex microscopic (not at [facility])     Status: Abnormal   Collection Time: 06/03/15  6:30 PM  Result Value Ref Range   Color, Urine YELLOW YELLOW   APPearance CLEAR CLEAR   Specific Gravity, Urine 1.020 1.005 - 1.030   pH 5.5 5.0 - 8.0   Glucose, UA NEGATIVE NEGATIVE mg/dL   Hgb urine dipstick MODERATE (A) NEGATIVE   Bilirubin Urine NEGATIVE NEGATIVE   Ketones, ur NEGATIVE NEGATIVE mg/dL   Protein,  ur NEGATIVE NEGATIVE mg/dL   Nitrite NEGATIVE NEGATIVE   Leukocytes, UA NEGATIVE NEGATIVE  Urine microscopic-add on     Status: Abnormal   Collection Time: 06/03/15  6:30 PM  Result Value Ref Range   Squamous Epithelial / LPF 0-5 (A) NONE SEEN   WBC, UA 0-5 0 - 5 WBC/hpf   RBC / HPF 0-5 0 - 5 RBC/hpf   Bacteria, UA RARE (A) NONE SEEN  Wet prep, genital     Status: Abnormal   Collection Time: 06/03/15  7:00 PM  Result Value Ref Range   Yeast Wet Prep HPF POC PRESENT (A) NONE SEEN   Trich, Wet Prep NONE SEEN NONE SEEN   Clue Cells Wet Prep HPF POC PRESENT (A) NONE SEEN   WBC, Wet Prep HPF POC FEW (A) NONE SEEN   Sperm NONE SEEN   CBC     Status: Abnormal   Collection Time: 06/03/15  7:16 PM  Result Value Ref Range   WBC 8.6 4.0 - 10.5 K/uL   RBC 4.03 3.87 - 5.11 MIL/uL   Hemoglobin 11.8 (L) 12.0 - 15.0 g/dL   HCT 40.9 (L) 81.1 - 91.4 %   MCV 87.1 78.0 - 100.0 fL   MCH 29.3 26.0 - 34.0 pg   MCHC 33.6 30.0 - 36.0 g/dL   RDW 78.2 95.6 - 21.3 %   Platelets 279 150 - 400 K/uL    --/--/O POS (01/23 1235)  IMAGING US Ob Transvaginal  06/03/2015  CLINICAL DATA:  Pregnant patient in first-trimester pregnancy with right-sided pain and vaginal bleeding today. EXAM: TRANSVAGINAL OB ULTRASOUND TECHNIQUE: Transvaginal ultrasound was performed for complete evaluation of the gestation as well as the maternal uterus, adnexal regions, and pelvic cul-de-sac. COMPARISON:  Obstetric ultrasound 15 days prior 05/18/2015 FINDINGS: Intrauterine gestational sac: Visualized/normal in shape. Yolk sac:  Not present. Embryo:  Not present. Cardiac Activity: Not present. MSD: 13.7  mm   6 w   2  d Subchorionic hemorrhage:  Small volume. Maternal uterus/adnexae: Both ovaries are visualized and normal in size. Probable corpus luteal cyst on the right. No pelvic free fluid. IMPRESSION: Intrauterine gestational sac, however the yolk sac is no longer seen. Findings meet definitive criteria for failed pregnancy. This  follows SRU consensus guidelines: Diagnostic Criteria for Nonviable Pregnancy Early in the First Trimester. [facility] Mis J Med [phone]. Electronically Signed   By: Rubye Oaks M.D.   On: 06/03/2015 20:11     Report to Judeth Horn, NP, with OB US pending    Sharen Counter Certified Nurse-Midwife 06/03/2015  8:03 PM      Ultrasound shows IUGS no yolk sac. Previous u/s on 2/8 showed IUGS with yolk sac. "definitive for failed pregnancy" Discussed results with patient. Given option of expectant management vs. Cytotec. Patient prefers expected management.    A/P  1. Vaginal bleeding in pregnancy, first trimester   2. Missed abortion    Discharge home  Discussed reasons to return to MAU Call office on Monday to schedule f/u appt When nurse went to room to give discharge paperwork & comfort pack, patient was already gone  Judeth Horn, NP

## 2015-06-03 NOTE — Discharge Instructions (Signed)
Return to care   If you have heavier bleeding that soaks through more that 2 pads per hour for an hour or more  If you bleed so much that you feel like you might pass out or you do pass out  If you have significant abdominal pain that is not improved with Tylenol   If you develop a fever > 100.5        Miscarriage A miscarriage is the sudden loss of an unborn baby (fetus) before the 20th week of pregnancy. Most miscarriages happen in the first 3 months of pregnancy. Sometimes, it happens before a woman even knows she is pregnant. A miscarriage is also called a "spontaneous miscarriage" or "early pregnancy loss." Having a miscarriage can be an emotional experience. Talk with your caregiver about any questions you may have about miscarrying, the grieving process, and your future pregnancy plans. CAUSES   Problems with the fetal chromosomes that make it impossible for the baby to develop normally. Problems with the baby's genes or chromosomes are most often the result of errors that occur, by chance, as the embryo divides and grows. The problems are not inherited from the parents.  Infection of the cervix or uterus.   Hormone problems.   Problems with the cervix, such as having an incompetent cervix. This is when the tissue in the cervix is not strong enough to hold the pregnancy.   Problems with the uterus, such as an abnormally shaped uterus, uterine fibroids, or congenital abnormalities.   Certain medical conditions.   Smoking, drinking alcohol, or taking illegal drugs.   Trauma.  Often, the cause of a miscarriage is unknown.  SYMPTOMS   Vaginal bleeding or spotting, with or without cramps or pain.  Pain or cramping in the abdomen or lower back.  Passing fluid, tissue, or blood clots from the vagina. DIAGNOSIS  Your caregiver will perform a physical exam. You may also have an ultrasound to confirm the miscarriage. Blood or urine tests may also be ordered. TREATMENT    Sometimes, treatment is not necessary if you naturally pass all the fetal tissue that was in the uterus. If some of the fetus or placenta remains in the body (incomplete miscarriage), tissue left behind may become infected and must be removed. Usually, a dilation and curettage (D and C) procedure is performed. During a D and C procedure, the cervix is widened (dilated) and any remaining fetal or placental tissue is gently removed from the uterus.  Antibiotic medicines are prescribed if there is an infection. Other medicines may be given to reduce the size of the uterus (contract) if there is a lot of bleeding.  If you have Rh negative blood and your baby was Rh positive, you will need a Rh immunoglobulin shot. This shot will protect any future baby from having Rh blood problems in future pregnancies. HOME CARE INSTRUCTIONS   Your caregiver may order bed rest or may allow you to continue light activity. Resume activity as directed by your caregiver.  Have someone help with home and family responsibilities during this time.   Keep track of the number of sanitary pads you use each day and how soaked (saturated) they are. Write down this information.   Do not use tampons. Do not douche or have sexual intercourse until approved by your caregiver.   Only take over-the-counter or prescription medicines for pain or discomfort as directed by your caregiver.   Do not take aspirin. Aspirin can cause bleeding.   Keep all  follow-up appointments with your caregiver.   If you or your partner have problems with grieving, talk to your caregiver or seek counseling to help cope with the pregnancy loss. Allow enough time to grieve before trying to get pregnant again.  SEEK IMMEDIATE MEDICAL CARE IF:  6. You have severe cramps or pain in your back or abdomen. 7. You have a fever. 8. You pass large blood clots (walnut-sized or larger) ortissue from your vagina. Save any tissue for your caregiver to  inspect.  9. Your bleeding increases.  10. You have a thick, bad-smelling vaginal discharge. 11. You become lightheaded, weak, or you faint.  12. You have chills.  MAKE SURE YOU:  Understand these instructions.  Will watch your condition.  Will get help right away if you are not doing well or get worse.   This information is not intended to replace advice given to you by your health care provider. Make sure you discuss any questions you have with your health care provider.   Document Released: 09/19/2000 Document Revised: 07/21/2012 Document Reviewed: 05/15/2011 Elsevier Interactive Patient Education [employer]

## 2015-06-03 NOTE — MAU Note (Signed)
About 1 hour ago patient went the bathroom saw blood on the toilet tissue, right side pain. LMP 03/14/15

## 2015-06-03 NOTE — Progress Notes (Signed)
Patient did not stay for exam

## 2015-06-04 LAB — HIV ANTIBODY (ROUTINE TESTING W REFLEX): HIV Screen 4th Generation wRfx: NONREACTIVE

## 2015-06-06 LAB — GC/CHLAMYDIA PROBE AMP (~~LOC~~) NOT AT ARMC
CHLAMYDIA, DNA PROBE: NEGATIVE
Neisseria Gonorrhea: NEGATIVE

## 2015-06-09 ENCOUNTER — Inpatient Hospital Stay (HOSPITAL_COMMUNITY)
Admission: AD | Admit: 2015-06-09 | Discharge: 2015-06-09 | Disposition: A | Discharge status: Home / Self Care | Payer: Medicaid Other | Source: Ambulatory Visit | Attending: Obstetrics | Admitting: Obstetrics

## 2015-06-09 ENCOUNTER — Encounter (HOSPITAL_COMMUNITY): Payer: Self-pay

## 2015-06-09 DIAGNOSIS — R109 Unspecified abdominal pain: Secondary | ICD-10-CM | POA: Diagnosis present

## 2015-06-09 DIAGNOSIS — O034 Incomplete spontaneous abortion without complication: Secondary | ICD-10-CM | POA: Insufficient documentation

## 2015-06-09 DIAGNOSIS — O209 Hemorrhage in early pregnancy, unspecified: Secondary | ICD-10-CM

## 2015-06-09 DIAGNOSIS — Z87891 Personal history of nicotine dependence: Secondary | ICD-10-CM | POA: Diagnosis not present

## 2015-06-09 LAB — CBC
HEMATOCRIT: 34.6 % — AB (ref 36.0–46.0)
Hemoglobin: 11.8 g/dL — ABNORMAL LOW (ref 12.0–15.0)
MCH: 29.7 pg (ref 26.0–34.0)
MCHC: 34.1 g/dL (ref 30.0–36.0)
MCV: 87.2 fL (ref 78.0–100.0)
PLATELETS: 301 10*3/uL (ref 150–400)
RBC: 3.97 MIL/uL (ref 3.87–5.11)
RDW: 12.8 % (ref 11.5–15.5)
WBC: 6.8 10*3/uL (ref 4.0–10.5)

## 2015-06-09 LAB — URINALYSIS, ROUTINE W REFLEX MICROSCOPIC
Bilirubin Urine: NEGATIVE
GLUCOSE, UA: NEGATIVE mg/dL
KETONES UR: NEGATIVE mg/dL
Nitrite: NEGATIVE
PROTEIN: 30 mg/dL — AB
Specific Gravity, Urine: 1.02 (ref 1.005–1.030)
pH: 7.5 (ref 5.0–8.0)

## 2015-06-09 LAB — URINE MICROSCOPIC-ADD ON

## 2015-06-09 MED ORDER — IBUPROFEN 600 MG PO TABS: 600.0000 mg | ORAL_TABLET | Freq: Four times a day (QID) | ORAL | Status: DC | PRN

## 2015-06-09 MED ORDER — MISOPROSTOL 200 MCG PO TABS: 600.0000 ug | ORAL_TABLET | Freq: Once | ORAL | Status: DC

## 2015-06-09 MED ORDER — HYDROCODONE-ACETAMINOPHEN 5-325 MG PO TABS: 1.0000 | ORAL_TABLET | ORAL | Status: DC | PRN

## 2015-06-09 NOTE — Telephone Encounter (Signed)
CLOSE ENCOUMTER

## 2015-06-09 NOTE — MAU Note (Signed)
Patient states that she miscarried last Friday and was told to follow up two days later but didn't make it so she is here not. Patient is not currently having any pain but says it comes and goes.

## 2015-06-09 NOTE — MAU Provider Note (Signed)
History     CSN: [id number]  Arrival date and time: 06/09/15 1604   First Provider Initiated Contact with Patient 06/09/15 1650      Chief Complaint  Patient presents with  . Abdominal Pain   HPI   [name] is a 25 y.o. female [phone] at [redacted]w[redacted]d presenting to MAU for a follow up. She was seen on 2/24 and diagnosed with failed pregnancy; the patient was given the option for cytotec vs. Expectant management. The patient chose expectant management and was instructed to follow up in the [facility]. The patient missed her appointment.   She is interested in an RX for cytotec today.  She continues to have small amount of bright red bleeding, and menstrual like cramping.   OB History    Gravida Para Term Preterm AB TAB SAB Ectopic Multiple Living   Past Medical History  Diagnosis Date  . Hypertension   . Preterm labor   . Urinary tract infection   . JYNWGNFA(213.0)     Past Surgical History  Procedure Laterality Date  . Cesarean section    . Dilation and curettage of uterus    . Therapeutic abortion    . 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 Diabetes Mother   . Diabetes Maternal Grandmother   . Hypertension Maternal Grandmother   . Cancer Maternal Grandmother     colon cancer, brain tumour  . Other Neg Hx     Social History  Substance Use Topics  . Smoking status: Former Smoker    Quit date: 01/15/2013  . Smokeless tobacco: Never Used  . Alcohol Use: No    Allergies: No Known Allergies  No prescriptions prior to admission   Results for orders placed or performed during the hospital encounter of 06/09/15 (from the past 48 hour(s))  Urinalysis, Routine w reflex microscopic (not at [facility])     Status: Abnormal   Collection Time: 06/09/15  4:18 PM  Result Value Ref Range   Color, Urine YELLOW YELLOW   APPearance CLEAR CLEAR   Specific Gravity, Urine 1.020 1.005 - 1.030    pH 7.5 5.0 - 8.0   Glucose, UA NEGATIVE NEGATIVE mg/dL   Hgb urine dipstick LARGE (A) NEGATIVE   Bilirubin Urine NEGATIVE NEGATIVE   Ketones, ur NEGATIVE NEGATIVE mg/dL   Protein, ur 30 (A) NEGATIVE mg/dL   Nitrite NEGATIVE NEGATIVE   Leukocytes, UA TRACE (A) NEGATIVE  Urine microscopic-add on     Status: Abnormal   Collection Time: 06/09/15  4:18 PM  Result Value Ref Range   Squamous Epithelial / LPF 0-5 (A) NONE SEEN   WBC, UA 0-5 0 - 5 WBC/hpf   RBC / HPF TOO NUMEROUS TO COUNT 0 - 5 RBC/hpf   Bacteria, UA FEW (A) NONE SEEN  CBC     Status: Abnormal   Collection Time: 06/09/15  5:14 PM  Result Value Ref Range   WBC 6.8 4.0 - 10.5 K/uL   RBC 3.97 3.87 - 5.11 MIL/uL   Hemoglobin 11.8 (L) 12.0 - 15.0 g/dL   HCT 86.5 (L) 78.4 - 69.6 %   MCV 87.2 78.0 - 100.0 fL   MCH 29.7 26.0 - 34.0 pg   MCHC 34.1 30.0 - 36.0 g/dL   RDW 29.5 [id number] 13.2 %   Platelets 301 150 -  400 K/uL     CLINICAL DATA: Pregnant patient in first-trimester pregnancy with right-sided pain and vaginal bleeding today.  EXAM: TRANSVAGINAL OB ULTRASOUND  TECHNIQUE: Transvaginal ultrasound was performed for complete evaluation of the gestation as well as the maternal uterus, adnexal regions, and pelvic cul-de-sac.  COMPARISON: Obstetric ultrasound 15 days prior 05/18/2015  FINDINGS: Intrauterine gestational sac: Visualized/normal in shape.  Yolk sac: Not present.  Embryo: Not present.  Cardiac Activity: Not present.  MSD: 13.7 mm 6 w 2 d  Subchorionic hemorrhage: Small volume.  Maternal uterus/adnexae: Both ovaries are visualized and normal in size. Probable corpus luteal cyst on the right. No pelvic free fluid.  IMPRESSION: Intrauterine gestational sac, however the yolk sac is no longer seen. Findings meet definitive criteria for failed pregnancy. This follows SRU consensus guidelines: Diagnostic Criteria for Nonviable Pregnancy Early in the First Trimester. [facility] Mis J  Med [phone].   Electronically Signed  By: Rubye Oaks M.D.  On: 06/03/2015 20:11   Review of Systems  Constitutional: Negative for fever and chills.  Gastrointestinal: Positive for abdominal pain. Negative for nausea, vomiting, diarrhea and constipation.   Physical Exam   Blood pressure 113/71, pulse 84, temperature 98.2 F (36.8 C), temperature source Oral, resp. rate 16, last menstrual period 03/14/2015, unknown if currently breastfeeding.  Physical Exam  Constitutional: She is oriented to person, place, and time. She appears well-developed and well-nourished. No distress.  HENT:  Head: Normocephalic.  Eyes: Pupils are equal, round, and reactive to light.  Musculoskeletal: Normal range of motion.  Neurological: She is alert and oriented to person, place, and time.  Skin: Skin is warm. She is not diaphoretic.  Psychiatric: Her behavior is normal.    MAU Course  Procedures  None   MDM  CBC O positive blood type   Early Intrauterine Pregnancy Failure Protocol X  Documented intrauterine pregnancy failure less than or equal to [redacted] weeks gestation  X  No serious current illness  X  Baseline Hgb greater than or equal to 10g/dl  X  Patient has easily accessible transportation to the hospital  X  Clear preference  X  Practitioner/physician deems patient reliable  X  Counseling by practitioner or physician  X  Patient education by RN  X  Consent form signed       Rho-Gam given by RN if indicated  X  Medication dispensed  X  Cytotec 600 mcg buccal by patient at home       Intravaginally by NP in MAU       Rectally by patient at home       Rectally by RN in MAU  X   Ibuprofen 600 mg 1 tablet by mouth every 6 hours as needed #30 - prescribed  X   Vicodin 1-2 tabs by mouth every 4 to 6 hours as needed - prescribed   Reviewed with pt cytotec procedure.  Pt verbalizes that she lives close to the hospital and has transportation readily available.  Pt appears  reliable and verbalizes understanding and agrees with plan of care  Assessment and Plan   A:  1. Incomplete miscarriage   2. Vaginal bleeding in pregnancy, first trimester     P:  Discharge home in stable condition RX: Cytotec, Vicodin, ibuprofen Return to MAU for emergencies only Follow up with Dr. Verdell Carmine office in 1-2 weeks; call the office to schedule the appointment Bleeding precautions  Duane Lope, NP 06/09/2015 5:48 PM

## 2015-06-09 NOTE — Discharge Instructions (Signed)
Incomplete Miscarriage A miscarriage is the sudden loss of an unborn baby (fetus) before the 20th week of pregnancy. In an incomplete miscarriage, parts of the fetus or placenta (afterbirth) remain in the body.  Having a miscarriage can be an emotional experience. Talk with your health care provider about any questions you may have about miscarrying, the grieving process, and your future pregnancy plans. CAUSES   Problems with the fetal chromosomes that make it impossible for the baby to develop normally. Problems with the baby's genes or chromosomes are most often the result of errors that occur by chance as the embryo divides and grows. The problems are not inherited from the parents.  Infection of the cervix or uterus.  Hormone problems.  Problems with the cervix, such as having an incompetent cervix. This is when the tissue in the cervix is not strong enough to hold the pregnancy.  Problems with the uterus, such as an abnormally shaped uterus, uterine fibroids, or congenital abnormalities.  Certain medical conditions.  Smoking, drinking alcohol, or taking illegal drugs.  Trauma. SYMPTOMS   Vaginal bleeding or spotting, with or without cramps or pain.  Pain or cramping in the abdomen or lower back.  Passing fluid, tissue, or blood clots from the vagina. DIAGNOSIS  Your health care provider will perform a physical exam. You may also have an ultrasound to confirm the miscarriage. Blood or urine tests may also be ordered. TREATMENT   Usually, a dilation and curettage (D&C) procedure is performed. During a D&C procedure, the cervix is widened (dilated) and any remaining fetal or placental tissue is gently removed from the uterus.  Antibiotic medicines are prescribed if there is an infection. Other medicines may be given to reduce the size of the uterus (contract) if there is a lot of bleeding.  If you have Rh negative blood and your baby was Rh positive, you will need a Rho (D)  immune globulin shot. This shot will protect any future baby from having Rh blood problems in future pregnancies.  You may be confined to bed rest. This means you should stay in bed and only get up to use the bathroom. HOME CARE INSTRUCTIONS   Rest as directed by your health care provider.  Restrict activity as directed by your health care provider. You may be allowed to continue light activity if curettage was not done but you require further treatment.  Keep track of the number of pads you use each day. Keep track of how soaked (saturated) they are. Record this information.  Do not  use tampons.  Do not douche or have sexual intercourse until approved by your health care provider.  Keep all follow-up appointments for reevaluation and continuing management.  Only take over-the-counter or prescription medicines for pain, fever, or discomfort as directed by your health care provider.  Take antibiotic medicine as directed by your health care provider. Make sure you finish it even if you start to feel better. SEEK IMMEDIATE MEDICAL CARE IF:   You experience severe cramps in your stomach, back, or abdomen.  You have an unexplained temperature (make sure to record these temperatures).  You pass large clots or tissue (save these for your health care provider to inspect).  Your bleeding increases.  You become light-headed, weak, or have fainting episodes. MAKE SURE YOU:   Understand these instructions.  Will watch your condition.  Will get help right away if you are not doing well or get worse.   This information is not intended to   replace advice given to you by your health care provider. Make sure you discuss any questions you have with your health care provider.   Document Released: 03/26/2005 Document Revised: 04/16/2014 Document Reviewed: 10/23/2012 Elsevier Interactive Patient Education 2016 [employer]  

## 2015-06-21 ENCOUNTER — Inpatient Hospital Stay (HOSPITAL_COMMUNITY)
Admission: AD | Admit: 2015-06-21 | Discharge: 2015-06-21 | Disposition: A | Discharge status: Home / Self Care | Payer: Medicaid Other | Source: Ambulatory Visit | Attending: Obstetrics & Gynecology | Admitting: Obstetrics & Gynecology

## 2015-06-21 ENCOUNTER — Encounter (HOSPITAL_COMMUNITY): Payer: Self-pay | Admitting: *Deleted

## 2015-06-21 ENCOUNTER — Inpatient Hospital Stay (HOSPITAL_COMMUNITY): Payer: Medicaid Other

## 2015-06-21 DIAGNOSIS — Z3A01 Less than 8 weeks gestation of pregnancy: Secondary | ICD-10-CM | POA: Diagnosis not present

## 2015-06-21 DIAGNOSIS — O209 Hemorrhage in early pregnancy, unspecified: Secondary | ICD-10-CM

## 2015-06-21 DIAGNOSIS — O021 Missed abortion: Secondary | ICD-10-CM | POA: Diagnosis present

## 2015-06-21 DIAGNOSIS — Z87891 Personal history of nicotine dependence: Secondary | ICD-10-CM | POA: Insufficient documentation

## 2015-06-21 DIAGNOSIS — I10 Essential (primary) hypertension: Secondary | ICD-10-CM | POA: Insufficient documentation

## 2015-06-21 LAB — TYPE AND SCREEN
ABO/RH(D): O POS
Antibody Screen: NEGATIVE

## 2015-06-21 LAB — CBC
HCT: 36.2 % (ref 36.0–46.0)
HEMOGLOBIN: 11.9 g/dL — AB (ref 12.0–15.0)
MCH: 29 pg (ref 26.0–34.0)
MCHC: 32.9 g/dL (ref 30.0–36.0)
MCV: 88.3 fL (ref 78.0–100.0)
PLATELETS: 304 10*3/uL (ref 150–400)
RBC: 4.1 MIL/uL (ref 3.87–5.11)
RDW: 13.2 % (ref 11.5–15.5)
WBC: 7.4 10*3/uL (ref 4.0–10.5)

## 2015-06-21 LAB — HCG, QUANTITATIVE, PREGNANCY: HCG, BETA CHAIN, QUANT, S: 211 m[IU]/mL — AB (ref <5)

## 2015-06-21 MED ORDER — KETOROLAC TROMETHAMINE 60 MG/2ML IM SOLN
60.0000 mg | Freq: Once | INTRAMUSCULAR | Status: AC
  Administered 2015-06-21: 60 mg via INTRAMUSCULAR
  Filled 2015-06-21: qty 2

## 2015-06-21 MED ORDER — HYDROCODONE-ACETAMINOPHEN 5-325 MG PO TABS: 1.0000 | ORAL_TABLET | ORAL | Status: DC | PRN

## 2015-06-21 MED ORDER — MISOPROSTOL 200 MCG PO TABS: 800.0000 ug | ORAL_TABLET | Freq: Once | ORAL | Status: DC

## 2015-06-21 MED ORDER — OXYCODONE-ACETAMINOPHEN 5-325 MG PO TABS: 1.0000 | ORAL_TABLET | ORAL | Status: DC | PRN

## 2015-06-21 NOTE — MAU Provider Note (Signed)
Chief Complaint: Abdominal Pain and Vaginal Bleeding   First Provider Initiated Contact with Patient 06/21/15 0859     SUBJECTIVE HPI: [name] is a 25 y.o. [phone] w/ Dx of missed AB measuring 6.2 weeks who presents to [facility] by EMS reporting cramping and bleeding intermittently since using Cytotec 06/09/15. Passing clots. Doesn't think she passed tissue. [facility], but didn't stay for appointment. Has never been there in the past, but plans to continue care there. Per previous notes pt often leaves before exam or discussion of POC.   Location: low Abd,  Quality: cramping Severity: 10/10 on pain scale Duration: 1 month Context: missed AB, has taking Cytotec x 1 Timing: Intermittent Modifying factors: Improved w/ Vicodin, ran out.  Associated signs and symptoms: Pos for VB and passing clots. Neg fore fever, chills, GI complaints, Urinary complaints, dizziness.   Past Medical History  Diagnosis Date  . Hypertension   . Preterm labor   . Urinary tract infection   . Headache(784.0)    OB History  Gravida Para Term Preterm AB SAB TAB Ectopic Multiple Living  4 1  1 2 1 1  1 2     # Outcome Date GA Lbr Len/2nd Weight Sex Delivery Anes PTL Lv  4A Preterm 05/17/10     CS-LTranv  Y      Comments: PIH  4B Preterm 05/17/10       Y   3 Gravida           2 SAB           1 TAB              Past Surgical History  Procedure Laterality Date  . Cesarean section    . Dilation and curettage of uterus    . Therapeutic abortion    . 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    Removal;right   Social History   Social History  . Marital Status: Single    Spouse Name: N/A  . Number of Children: N/A  . Years of Education: N/A   Occupational History  . None    Social History Main Topics  . Smoking status: Former Smoker    Quit date: 01/15/2013  . Smokeless tobacco: Never Used  . Alcohol Use: No  . Drug Use: No     Comment: daily , recently started 2 months prior   . Sexual Activity: Yes    Birth Control/ Protection: None   Other Topics Concern  . Not on file   Social History Narrative   No current facility-administered medications on file prior to encounter.   Current Outpatient Prescriptions on File Prior to Encounter  Medication Sig Dispense Refill  . ibuprofen (ADVIL,MOTRIN) 600 MG tablet Take 1 tablet (600 mg total) by mouth every 6 (six) hours as needed. (Patient taking differently: Take 600 mg by mouth every 6 (six) hours as needed for moderate pain. ) 30 tablet 0   No Known Allergies  I have reviewed the past Medical Hx, Surgical Hx, Social Hx, Allergies and Medications.   Review of Systems  Constitutional: Negative for fever and chills.  Gastrointestinal: Positive for abdominal pain. Negative for nausea, vomiting, diarrhea and constipation.  Genitourinary: Positive for vaginal bleeding. Negative for dysuria, urgency, frequency, hematuria, flank pain and vaginal discharge.  Musculoskeletal: Negative for back pain.  Neurological: Negative for dizziness.    OBJECTIVE Patient Vitals for the past 24 hrs:  BP Temp Temp src Pulse Resp  06/21/15  0844 116/77 mmHg 98.4 F (36.9 C) Oral 70 18   Constitutional: Well-developed, well-nourished female in no acute distress. No Pallor.  Cardiovascular: normal rate Respiratory: normal rate and effort.  GI: Abd soft, mild LLQ tenerdess. Pos BS x 4 MS: Extremities nontender, no edema, normal ROM Neurologic: Alert and oriented x 4.  GU: Neg CVAT.  SPECULUM EXAM: NEFG, physiologic discharge, scant dark brown blood noted, cervix clean, but incompletely visualized due to pt intolerance of exam.  BIMANUAL: cervix closed, irreg feeling. Unsure if palpating GS in cervix or cyst; uterus not obviously enlarged, but exam limited by body habitus and intolerance of exam. No adnexal tenderness or masses.  No CMT.  LAB RESULTS Results for orders placed or performed during the hospital encounter of 06/21/15  (from the past 24 hour(s))  CBC     Status: Abnormal   Collection Time: 06/21/15  9:06 AM  Result Value Ref Range   WBC 7.4 4.0 - 10.5 K/uL   RBC 4.10 3.87 - 5.11 MIL/uL   Hemoglobin 11.9 (L) 12.0 - 15.0 g/dL   HCT 16.1 09.6 - 04.5 %   MCV 88.3 78.0 - 100.0 fL   MCH 29.0 26.0 - 34.0 pg   MCHC 32.9 30.0 - 36.0 g/dL   RDW 40.9 81.1 - 91.4 %   Platelets 304 150 - 400 K/uL  hCG, quantitative, pregnancy     Status: Abnormal   Collection Time: 06/21/15  9:06 AM  Result Value Ref Range   hCG, Beta Chain, Quant, S 211 (H) <5 mIU/mL  Type and screen [facility] [place]     Status: None   Collection Time: 06/21/15  9:06 AM  Result Value Ref Range   ABO/RH(D) O POS    Antibody Screen NEG    Sample Expiration 06/24/2015     IMAGING US Ob Transvaginal  06/21/2015  CLINICAL DATA:  Missed AB.  Vaginal bleeding and cramping. EXAM: OBSTETRIC <14 WK [place] AND TRANSVAGINAL OB US TECHNIQUE: Both transabdominal and transvaginal ultrasound examinations were performed for complete evaluation of the gestation as well as the maternal uterus, adnexal regions, and pelvic cul-de-sac. Transvaginal technique was performed to assess early pregnancy. COMPARISON:  06/03/2015 FINDINGS: Intrauterine gestational sac: Single Yolk sac:  No Embryo:  No Cardiac Activity: No Heart Rate: Not applicable  bpm MSD: 22.9  mm   7 w   2  d Subchorionic hemorrhage:  None visualized. Maternal uterus/adnexae: Subchorionic hemorrhage: None Right ovary: Normal Left ovary: Normal Other :None Free fluid:  None IMPRESSION: 1. Intrauterine gestational sac is again noted. No embryo or yolk sac identified. It has been a greater than 2 weeks since the last ultrasound which showed a gestational sac without yolk sac or embryo. Findings meet definitive criteria for failed pregnancy. This follows SRU consensus guidelines: Diagnostic Criteria for Nonviable Pregnancy Early in the First Trimester. [facility] Mis J Med [phone]. Electronically  Signed   By: Signa Kell M.D.   On: 06/21/2015 10:25   US Ob Transvaginal  06/03/2015  CLINICAL DATA:  Pregnant patient in first-trimester pregnancy with right-sided pain and vaginal bleeding today. EXAM: TRANSVAGINAL OB ULTRASOUND TECHNIQUE: Transvaginal ultrasound was performed for complete evaluation of the gestation as well as the maternal uterus, adnexal regions, and pelvic cul-de-sac. COMPARISON:  Obstetric ultrasound 15 days prior 05/18/2015 FINDINGS: Intrauterine gestational sac: Visualized/normal in shape. Yolk sac:  Not present. Embryo:  Not present. Cardiac Activity: Not present. MSD: 13.7  mm   6 w   2  d Subchorionic hemorrhage:  Small volume. Maternal uterus/adnexae: Both ovaries are visualized and normal in size. Probable corpus luteal cyst on the right. No pelvic free fluid. IMPRESSION: Intrauterine gestational sac, however the yolk sac is no longer seen. Findings meet definitive criteria for failed pregnancy. This follows SRU consensus guidelines: Diagnostic Criteria for Nonviable Pregnancy Early in the First Trimester. [facility] Mis J Med [id number]. Electronically Signed   By: Rubye Oaks M.D.   On: 06/03/2015 20:11    MAU COURSE Pt initially refused exam, then consented. CBC, pelvic UA, Quant, Toradol.  Discussed Hx, exam, [place], labs w/ Dr. Macon Large who recommends trying second doe of Cytotec placed by provider vaginal in MAU. Pt refuses, but would like Rx to take at home. Thinks she needs D&C anyway. Will schedule F/U appt in two day in clinic to discuss, but strongly recommended that pt try Cytotec while awaiting appt. Doubt pt will comply.   MDM - 25 year-old female w/ missed AB, hemodynamically stable who has not passed POC's after expectant management and one dose of Cytotec. Abd pain likely normal for SAB. No evidence of infection. Pt non-toxic appearing.   ASSESSMENT 1. Missed abortion   2. Vaginal bleeding in pregnancy, first trimester     PLAN Discharge home in  stable condition per consult w/ Dr. Macon Large. SAB Precautions Rx Cytotec and Percocet.     Follow-up Information    Follow up with [facility] On 06/23/2015.   Specialty:  Obstetrics and Gynecology   Why:  at 2:00 pm for follow-up appointment and to discuss possible Dilation and Curettage   Contact information:   [address] [place] [address] [phone]      Follow up with [facility] [place] [facility].   Why:  As needed in emergencies   Contact information:   [address] [id number] mc [place] [address] [phone]       Medication List    STOP taking these medications        HYDROcodone-acetaminophen 5-325 MG tablet  Commonly known as:  NORCO/VICODIN      TAKE these medications        ibuprofen 600 MG tablet  Commonly known as:  ADVIL,MOTRIN  Take 1 tablet (600 mg total) by mouth every 6 (six) hours as needed.     misoprostol 200 MCG tablet  Commonly known as:  CYTOTEC  Place 4 tablets (800 mcg total) vaginally once.     oxyCODONE-acetaminophen 5-325 MG tablet  Commonly known as:  PERCOCET/ROXICET  Take 1 tablet by mouth every 4 (four) hours as needed.       [place], CNM 06/21/2015  11:40 AM

## 2015-06-21 NOTE — Discharge Instructions (Signed)
Incomplete Miscarriage °A miscarriage is the sudden loss of an unborn baby (fetus) before the 20th week of pregnancy. In an incomplete miscarriage, parts of the fetus or placenta (afterbirth) remain in the body.  °Having a miscarriage can be an emotional experience. Talk with your health care provider about any questions you may have about miscarrying, the grieving process, and your future pregnancy plans. °CAUSES  °· Problems with the fetal chromosomes that make it impossible for the baby to develop normally. Problems with the baby's genes or chromosomes are most often the result of errors that occur by chance as the embryo divides and grows. The problems are not inherited from the parents. °· Infection of the cervix or uterus. °· Hormone problems. °· Problems with the cervix, such as having an incompetent cervix. This is when the tissue in the cervix is not strong enough to hold the pregnancy. °· Problems with the uterus, such as an abnormally shaped uterus, uterine fibroids, or congenital abnormalities. °· Certain medical conditions. °· Smoking, drinking alcohol, or taking illegal drugs. °· Trauma. °SYMPTOMS  °· Vaginal bleeding or spotting, with or without cramps or pain. °· Pain or cramping in the abdomen or lower back. °· Passing fluid, tissue, or blood clots from the vagina. °DIAGNOSIS  °Your health care provider will perform a physical exam. You may also have an ultrasound to confirm the miscarriage. Blood or urine tests may also be ordered. °TREATMENT  °· Usually, a dilation and curettage (D&C) procedure is performed. During a D&C procedure, the cervix is widened (dilated) and any remaining fetal or placental tissue is gently removed from the uterus. °· Antibiotic medicines are prescribed if there is an infection. Other medicines may be given to reduce the size of the uterus (contract) if there is a lot of bleeding. °· If you have Rh negative blood and your baby was Rh positive, you will need a Rho (D)  immune globulin shot. This shot will protect any future baby from having Rh blood problems in future pregnancies. °· You may be confined to bed rest. This means you should stay in bed and only get up to use the bathroom. °HOME CARE INSTRUCTIONS  °· Rest as directed by your health care provider. °· Restrict activity as directed by your health care provider. You may be allowed to continue light activity if curettage was not done but you require further treatment. °· Keep track of the number of pads you use each day. Keep track of how soaked (saturated) they are. Record this information. °· Do not  use tampons. °· Do not douche or have sexual intercourse until approved by your health care provider. °· Keep all follow-up appointments for reevaluation and continuing management. °· Only take over-the-counter or prescription medicines for pain, fever, or discomfort as directed by your health care provider. °· Take antibiotic medicine as directed by your health care provider. Make sure you finish it even if you start to feel better. °SEEK IMMEDIATE MEDICAL CARE IF:  °· You experience severe cramps in your stomach, back, or abdomen. °· You have an unexplained temperature (make sure to record these temperatures). °· You pass large clots or tissue (save these for your health care provider to inspect). °· Your bleeding increases. °· You become light-headed, weak, or have fainting episodes. °MAKE SURE YOU:  °· Understand these instructions. °· Will watch your condition. °· Will get help right away if you are not doing well or get worse. °  °This information is not intended to   replace advice given to you by your health care provider. Make sure you discuss any questions you have with your health care provider.   Document Released: 03/26/2005 Document Revised: 04/16/2014 Document Reviewed: 10/23/2012 Elsevier Interactive Patient Education 2016 Elsevier Inc.  Dilation and Curettage or Vacuum Curettage Dilation and curettage  (D&C) and vacuum curettage are minor procedures. A D&C involves stretching (dilation) the cervix and scraping (curettage) the inside lining of the womb (uterus). During a D&C, tissue is gently scraped from the inside lining of the uterus. During a vacuum curettage, the lining and tissue in the uterus are removed with the use of gentle suction.  Curettage may be performed to either diagnose or treat a problem. As a diagnostic procedure, curettage is performed to examine tissues from the uterus. A diagnostic curettage may be performed for the following symptoms:   Irregular bleeding in the uterus.   Bleeding with the development of clots.   Spotting between menstrual periods.   Prolonged menstrual periods.   Bleeding after menopause.   No menstrual period (amenorrhea).   A change in size and shape of the uterus.  As a treatment procedure, curettage may be performed for the following reasons:   Removal of an IUD (intrauterine device).   Removal of retained placenta after giving birth. Retained placenta can cause an infection or bleeding severe enough to require transfusions.   Abortion.   Miscarriage.   Removal of polyps inside the uterus.   Removal of uncommon types of noncancerous lumps (fibroids).  LET [facility] KNOW ABOUT:   Any allergies you have.   All medicines you are taking, including vitamins, herbs, eye drops, creams, and over-the-counter medicines.   Previous problems you or members of your family have had with the use of anesthetics.   Any blood disorders you have.   Previous surgeries you have had.   Medical conditions you have. RISKS AND COMPLICATIONS  Generally, this is a safe procedure. However, as with any procedure, complications can occur. Possible complications include:  Excessive bleeding.   Infection of the uterus.   Damage to the cervix.   Development of scar tissue (adhesions) inside the uterus, later  causing abnormal amounts of menstrual bleeding.   Complications from the general anesthetic, if a general anesthetic is used.   Putting a hole (perforation) in the uterus. This is rare.  BEFORE THE PROCEDURE   Eat and drink before the procedure only as directed by your health care provider.   Arrange for someone to take you home.  PROCEDURE  This procedure usually takes about 15-30 minutes.  You will be given one of the following:  A medicine that numbs the area in and around the cervix (local anesthetic).   A medicine to make you sleep through the procedure (general anesthetic).  You will lie on your back with your legs in stirrups.   A warm metal or plastic instrument (speculum) will be placed in your vagina to keep it open and to allow the health care provider to see the cervix.  There are two ways in which your cervix can be softened and dilated. These include:   Taking a medicine.   Having thin rods (laminaria) inserted into your cervix.   A curved tool (curette) will be used to scrape cells from the inside lining of the uterus. In some cases, gentle suction is applied with the curette. The curette will then be removed.  AFTER THE PROCEDURE   You will rest in the recovery  area until you are stable and are ready to go home.   You may feel sick to your stomach (nauseous) or throw up (vomit) if you were given a general anesthetic.   You may have a sore throat if a tube was placed in your throat during general anesthesia.   You may have light cramping and bleeding. This may last for 2 days to 2 weeks after the procedure.   Your uterus needs to make a new lining after the procedure. This may make your next period late.   This information is not intended to replace advice given to you by your health care provider. Make sure you discuss any questions you have with your health care provider.   Document Released: 03/26/2005 Document Revised: 11/26/2012 Document  Reviewed: 10/23/2012 Elsevier Interactive Patient Education 2016 Elsevier Inc.  

## 2015-06-21 NOTE — MAU Note (Signed)
Had a miscarriage a week and a half ago; c/o increased cramping and intermittent bleeding with large blood clots;

## 2015-06-23 ENCOUNTER — Encounter: Payer: Self-pay | Admitting: General Practice

## 2015-06-23 ENCOUNTER — Ambulatory Visit: Payer: Medicaid Other | Admitting: Obstetrics & Gynecology

## 2015-07-26 ENCOUNTER — Emergency Department (HOSPITAL_COMMUNITY)
Admission: EM | Admit: 2015-07-26 | Discharge: 2015-07-27 | Disposition: A | Discharge status: Home / Self Care | Payer: Medicaid Other | Attending: Emergency Medicine | Admitting: Emergency Medicine

## 2015-07-26 ENCOUNTER — Emergency Department (HOSPITAL_COMMUNITY): Payer: Medicaid Other

## 2015-07-26 ENCOUNTER — Encounter (HOSPITAL_COMMUNITY): Payer: Self-pay | Admitting: *Deleted

## 2015-07-26 DIAGNOSIS — Z87440 Personal history of urinary (tract) infections: Secondary | ICD-10-CM | POA: Diagnosis not present

## 2015-07-26 DIAGNOSIS — M940 Chondrocostal junction syndrome [Tietze]: Secondary | ICD-10-CM | POA: Diagnosis not present

## 2015-07-26 DIAGNOSIS — R0789 Other chest pain: Secondary | ICD-10-CM | POA: Insufficient documentation

## 2015-07-26 DIAGNOSIS — R079 Chest pain, unspecified: Secondary | ICD-10-CM | POA: Diagnosis present

## 2015-07-26 DIAGNOSIS — Z87891 Personal history of nicotine dependence: Secondary | ICD-10-CM | POA: Diagnosis not present

## 2015-07-26 DIAGNOSIS — R05 Cough: Secondary | ICD-10-CM | POA: Diagnosis not present

## 2015-07-26 LAB — CBC
HEMATOCRIT: 40.5 % (ref 36.0–46.0)
HEMOGLOBIN: 13.6 g/dL (ref 12.0–15.0)
MCH: 29.4 pg (ref 26.0–34.0)
MCHC: 33.6 g/dL (ref 30.0–36.0)
MCV: 87.7 fL (ref 78.0–100.0)
Platelets: 282 10*3/uL (ref 150–400)
RBC: 4.62 MIL/uL (ref 3.87–5.11)
RDW: 13.1 % (ref 11.5–15.5)
WBC: 9.8 10*3/uL (ref 4.0–10.5)

## 2015-07-26 LAB — BASIC METABOLIC PANEL
ANION GAP: 8 (ref 5–15)
BUN: 9 mg/dL (ref 6–20)
CALCIUM: 8.5 mg/dL — AB (ref 8.9–10.3)
CHLORIDE: 105 mmol/L (ref 101–111)
CO2: 23 mmol/L (ref 22–32)
Creatinine, Ser: 0.83 mg/dL (ref 0.44–1.00)
GFR calc Af Amer: >60 mL/min (ref >60)
GFR calc non Af Amer: >60 mL/min (ref >60)
GLUCOSE: 106 mg/dL — AB (ref 65–99)
POTASSIUM: 3.7 mmol/L (ref 3.5–5.1)
Sodium: 136 mmol/L (ref 135–145)

## 2015-07-26 LAB — I-STAT TROPONIN, ED: TROPONIN I, POC: 0 ng/mL (ref 0.00–0.08)

## 2015-07-26 MED ORDER — NAPROXEN 250 MG PO TABS
500.0000 mg | ORAL_TABLET | Freq: Once | ORAL | Status: AC
  Administered 2015-07-27: 500 mg via ORAL
  Filled 2015-07-26: qty 2

## 2015-07-26 MED ORDER — NAPROXEN 500 MG PO TABS: 500.0000 mg | ORAL_TABLET | Freq: Two times a day (BID) | ORAL | Status: DC

## 2015-07-26 NOTE — ED Provider Notes (Signed)
CSN: [id number]     Arrival date & time 07/26/15  2234 History   First MD Initiated Contact with Patient 07/26/15 2308     Chief Complaint  Patient presents with  . Chest Pain     (Consider location/radiation/quality/duration/timing/severity/associated sxs/prior Treatment) HPI Comments: 25 year old female with a history of hypertension in the setting of pregnancy presents to the emergency department for evaluation of chest pain. Chest pain has been waxing and waning over the course of the day today and constant. Patient reports worsening pain with movement and deep breathing. She denies taking any medications for symptoms and reports recent upper respiratory infection which she believes is resolving. She had a sporadic cough which has improved, though the pain is worsened with coughing as well. She denies nasal congestion, rhinorrhea, fever, and syncope. She denies the use of birth control pills. No recent surgeries or hospitalizations. No family history of sudden cardiac death at a young age.  Patient is a 25 y.o. female presenting with chest pain. The history is provided by the patient. No language interpreter was used.  Chest Pain Associated symptoms: cough   Associated symptoms: no fever, no nausea, no shortness of breath and not vomiting     Past Medical History  Diagnosis Date  . Hypertension   . Preterm labor   . Urinary tract infection   . XBMWUXLK(440.1Headache(784.0)    Past Surgical History  Procedure Laterality Date  . Cesarean section    . Dilation and curettage of uterus    . Therapeutic abortion    . 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    Removal;right   Family History  Problem Relation Age of Onset  . Diabetes Mother   . Diabetes Maternal Grandmother   . Hypertension Maternal Grandmother   . Cancer Maternal Grandmother     colon cancer, brain tumour  . Other Neg Hx    Social History  Substance Use Topics  . Smoking status: Former Smoker    Quit date: 01/15/2013  .  Smokeless tobacco: Never Used  . Alcohol Use: No   OB History    Gravida Para Term Preterm AB TAB SAB Ectopic Multiple Living   4 1  1 2 1 1  1 2       Review of Systems  Constitutional: Negative for fever.  HENT: Negative for congestion.   Respiratory: Positive for cough. Negative for shortness of breath.   Cardiovascular: Positive for chest pain.  Gastrointestinal: Negative for nausea and vomiting.  Neurological: Negative for syncope.  All other systems reviewed and are negative.   Allergies  Review of patient's allergies indicates no known allergies.  Home Medications   Prior to Admission medications   Medication Sig Start Date End Date Taking? Authorizing Provider  ibuprofen (ADVIL,MOTRIN) 600 MG tablet Take 1 tablet (600 mg total) by mouth every 6 (six) hours as needed. Patient taking differently: Take 600 mg by mouth every 6 (six) hours as needed for moderate pain.  06/09/15   Duane LopeJennifer I Rasch, NP  misoprostol (CYTOTEC) 200 MCG tablet Place 4 tablets (800 mcg total) vaginally once. 06/21/15   Dorathy KinsmanVirginia Smith, CNM  oxyCODONE-acetaminophen (PERCOCET/ROXICET) 5-325 MG tablet Take 1 tablet by mouth every 4 (four) hours as needed. 06/21/15   Dorathy KinsmanVirginia Smith, CNM   BP 96/67 mmHg  Pulse 97  Temp(Src) 97.9 F (36.6 C) (Oral)  Resp 14  Ht 5\' 3"  (1.6 m)  Wt 107.094 kg  BMI 41.83 kg/m2  SpO2 100%  LMP 07/21/2015  Physical Exam  Constitutional: She is oriented to person, place, and time. She appears well-developed and well-nourished. No distress.  Nontoxic/nonseptic appearing  HENT:  Head: Normocephalic and atraumatic.  Eyes: Conjunctivae and EOM are normal. No scleral icterus.  Neck: Normal range of motion.  No JVD  Cardiovascular: Normal rate, regular rhythm and intact distal pulses.   77bpm  Pulmonary/Chest: Effort normal and breath sounds normal. No respiratory distress. She has no wheezes. She has no rales. She exhibits tenderness.  Lungs clear to auscultation  bilaterally. There is tenderness to palpation to the anterior central and left chest wall without bony deformity or crepitus. Chest expansion symmetric.  Musculoskeletal: Normal range of motion.  Neurological: She is alert and oriented to person, place, and time. She exhibits normal muscle tone. Coordination normal.  GCS 15. Patient moving all extremities  Skin: Skin is warm and dry. No rash noted. She is not diaphoretic. No erythema. No pallor.  Psychiatric: She has a normal mood and affect. Her behavior is normal.  Nursing note and vitals reviewed.   ED Course  Procedures (including critical care time) Labs Review Labs Reviewed  BASIC METABOLIC PANEL - Abnormal; Notable for the following:    Glucose, Bld 106 (*)    Calcium 8.5 (*)    All other components within normal limits  CBC  I-STAT TROPOININ, ED    Imaging Review Dg Chest 2 View  07/26/2015  CLINICAL DATA:  Left-sided chest pain since 6 p.m. EXAM: CHEST  2 VIEW COMPARISON:  04/26/2012 FINDINGS: The heart size and mediastinal contours are within normal limits. Both lungs are clear. The visualized skeletal structures are unremarkable. IMPRESSION: No active cardiopulmonary disease. Electronically Signed   By: Burman Nieves M.D.   On: 07/26/2015 23:37     I have personally reviewed and evaluated these images and lab results as part of my medical decision-making.   EKG Interpretation   Date/Time:  Tuesday July 26 2015 22:40:11 EDT Ventricular Rate:  98 PR Interval:  142 QRS Duration: 78 QT Interval:  340 QTC Calculation: 434 R Axis:   45 Text Interpretation:  Normal sinus rhythm Nonspecific T wave abnormality  Abnormal ECG Sinus rhythm T wave abnormality Abnormal ekg Confirmed by  Gerhard Munch  MD [phone]) on 07/26/2015 11:10:42 PM      MDM   Final diagnoses:  Costochondritis  Chest wall pain    25 year old female presents to the emergency department for evaluation of chest pain, worse with movement,  coughing, and deep breathing. Symptoms began in the setting of a recent upper respiratory infection. Patient has a reassuring workup today. Chest x-ray negative for pneumonia and patient is afebrile. She has no tachycardia, tachypnea, dyspnea, or hypoxia. Patient is PERC negative; doubt PE. She has a heart score of 0-1 c/w low risk of MACE. EKG is not ischemic. Symptoms likely secondary to costochondritis. Will manage with naproxen on an outpatient basis. Primary care follow up advised and return precautions given. Patient discharged in satisfactory condition with no unaddressed concerns.   Filed Vitals:   07/26/15 2245  BP: 96/67  Pulse: 97  Temp: 97.9 F (36.6 C)  TempSrc: Oral  Resp: 14  Height:  (1.6 m)  Weight: 107.094 kg  SpO2: 100%      Antony Madura, PA-C 07/26/15 2359  Gerhard Munch, MD 07/27/15 0008

## 2015-07-26 NOTE — Discharge Instructions (Signed)
Chest Wall Pain °Chest wall pain is pain in or around the bones and muscles of your chest. Sometimes, an injury causes this pain. Sometimes, the cause may not be known. This pain may take several weeks or longer to get better. °HOME CARE INSTRUCTIONS  °Pay attention to any changes in your symptoms. Take these actions to help with your pain:  °· Rest as told by your health care provider.   °· Avoid activities that cause pain. These include any activities that use your chest muscles or your abdominal and side muscles to lift heavy items.    °· If directed, apply ice to the painful area: °· Put ice in a plastic bag. °· Place a towel between your skin and the bag. °· Leave the ice on for 20 minutes, 2-3 times per day. °· Take over-the-counter and prescription medicines only as told by your health care provider. °· Do not use tobacco products, including cigarettes, chewing tobacco, and e-cigarettes. If you need help quitting, ask your health care provider. °· Keep all follow-up visits as told by your health care provider. This is important. °SEEK MEDICAL CARE IF: °· You have a fever. °· Your chest pain becomes worse. °· You have new symptoms. °SEEK IMMEDIATE MEDICAL CARE IF: °· You have nausea or vomiting. °· You feel sweaty or light-headed. °· You have a cough with phlegm (sputum) or you cough up blood. °· You develop shortness of breath. °  °This information is not intended to replace advice given to you by your health care provider. Make sure you discuss any questions you have with your health care provider. °  °Document Released: 03/26/2005 Document Revised: 12/15/2014 Document Reviewed: 06/21/2014 °Elsevier Interactive Patient Education ©2016 [employer] ° °Costochondritis °Costochondritis, sometimes called Tietze syndrome, is a swelling and irritation (inflammation) of the tissue (cartilage) that connects your ribs with your breastbone (sternum). It causes pain in the chest and rib area. Costochondritis usually  goes away on its own over time. It can take up to 6 weeks or longer to get better, especially if you are unable to limit your activities. °CAUSES  °Some cases of costochondritis have no known cause. Possible causes include: °· Injury (trauma). °· Exercise or activity such as lifting. °· Severe coughing. °SIGNS AND SYMPTOMS °· Pain and tenderness in the chest and rib area. °· Pain that gets worse when coughing or taking deep breaths. °· Pain that gets worse with specific movements. °DIAGNOSIS  °Your health care provider will do a physical exam and ask about your symptoms. Chest X-rays or other tests may be done to rule out other problems. °TREATMENT  °Costochondritis usually goes away on its own over time. Your health care provider may prescribe medicine to help relieve pain. °HOME CARE INSTRUCTIONS  °· Avoid exhausting physical activity. Try not to strain your ribs during normal activity. This would include any activities using chest, abdominal, and side muscles, especially if heavy weights are used. °· Apply ice to the affected area for the first 2 days after the pain begins. °¨ Put ice in a plastic bag. °¨ Place a towel between your skin and the bag. °¨ Leave the ice on for 20 minutes, 2-3 times a day. °· Only take over-the-counter or prescription medicines as directed by your health care provider. °SEEK MEDICAL CARE IF: °· You have redness or swelling at the rib joints. These are signs of infection. °· Your pain does not go away despite rest or medicine. °SEEK IMMEDIATE MEDICAL CARE IF:  °· Your pain   increases or you are very uncomfortable. °· You have shortness of breath or difficulty breathing. °· You cough up blood. °· You have worse chest pains, sweating, or vomiting. °· You have a fever or persistent symptoms for more than 2-3 days. °· You have a fever and your symptoms suddenly get worse. °MAKE SURE YOU:  °· Understand these instructions. °· Will watch your condition. °· Will get help right away if you are  not doing well or get worse. °  °This information is not intended to replace advice given to you by your health care provider. Make sure you discuss any questions you have with your health care provider. °  °Document Released: 01/03/2005 Document Revised: 01/14/2013 Document Reviewed: 10/28/2012 °Elsevier Interactive Patient Education ©2016 [employer] ° °

## 2015-07-26 NOTE — ED Notes (Signed)
Pt here with sharp CP that increases with movement.  Pt has had URI, pain severe when coughing, taking deep breaths and any movement.

## 2015-08-20 ENCOUNTER — Inpatient Hospital Stay (HOSPITAL_COMMUNITY)
Admission: AD | Admit: 2015-08-20 | Discharge: 2015-08-21 | Disposition: A | Discharge status: Home / Self Care | Payer: Medicaid Other | Source: Ambulatory Visit | Attending: Obstetrics & Gynecology | Admitting: Obstetrics & Gynecology

## 2015-08-20 DIAGNOSIS — R1030 Lower abdominal pain, unspecified: Secondary | ICD-10-CM | POA: Insufficient documentation

## 2015-08-20 DIAGNOSIS — Z5321 Procedure and treatment not carried out due to patient leaving prior to being seen by health care provider: Secondary | ICD-10-CM | POA: Insufficient documentation

## 2015-08-20 DIAGNOSIS — N390 Urinary tract infection, site not specified: Secondary | ICD-10-CM | POA: Diagnosis not present

## 2015-08-20 NOTE — MAU Note (Addendum)
Pt presents complaining of lower abdominal pain and back pain that started at 1400 and has not tried pain medicine. Denies bleeding or abnormal discharge. LMP 07/21/15. Pt had miscarriage in March.

## 2015-08-21 ENCOUNTER — Inpatient Hospital Stay (EMERGENCY_DEPARTMENT_HOSPITAL)
Admission: AD | Admit: 2015-08-21 | Discharge: 2015-08-21 | Discharge status: Against Medical Advice | Payer: Medicaid Other | Source: Ambulatory Visit | Attending: Obstetrics & Gynecology | Admitting: Obstetrics & Gynecology

## 2015-08-21 DIAGNOSIS — Z87891 Personal history of nicotine dependence: Secondary | ICD-10-CM

## 2015-08-21 DIAGNOSIS — R102 Pelvic and perineal pain: Secondary | ICD-10-CM

## 2015-08-21 DIAGNOSIS — N390 Urinary tract infection, site not specified: Secondary | ICD-10-CM | POA: Diagnosis not present

## 2015-08-21 DIAGNOSIS — I10 Essential (primary) hypertension: Secondary | ICD-10-CM | POA: Insufficient documentation

## 2015-08-21 LAB — URINE MICROSCOPIC-ADD ON

## 2015-08-21 LAB — URINALYSIS, ROUTINE W REFLEX MICROSCOPIC
BILIRUBIN URINE: NEGATIVE
Glucose, UA: NEGATIVE mg/dL
Hgb urine dipstick: NEGATIVE
KETONES UR: NEGATIVE mg/dL
Leukocytes, UA: NEGATIVE
NITRITE: POSITIVE — AB
Protein, ur: NEGATIVE mg/dL
Specific Gravity, Urine: 1.01 (ref 1.005–1.030)
pH: 6 (ref 5.0–8.0)

## 2015-08-21 LAB — POCT PREGNANCY, URINE: PREG TEST UR: NEGATIVE

## 2015-08-21 MED ORDER — NITROFURANTOIN MONOHYD MACRO 100 MG PO CAPS
100.0000 mg | ORAL_CAPSULE | Freq: Two times a day (BID) | ORAL | Status: DC
Start: 2015-08-21 — End: 2015-09-01

## 2015-08-21 NOTE — MAU Note (Signed)
Not in lobby x1  

## 2015-08-21 NOTE — MAU Note (Signed)
Not in lobby x 3  

## 2015-08-21 NOTE — MAU Provider Note (Signed)
History     CSN: [id number]  Arrival date and time: 08/21/15 2250   First Provider Initiated Contact with Patient 08/21/15 2306      Chief Complaint  Patient presents with  . Pelvic Pain   HPI Comments: [name] is a 25 y.o. [phone] who presents today for results from her pregnancy test she had done yesterday. She states that she had a +UPT at home, and cramping yesterday. She reports that the cramping is less now.   Pelvic Pain The patient's primary symptoms include pelvic pain. This is a new problem. The current episode started yesterday. The problem has been resolved. The patient is experiencing no pain. She is not pregnant. Pertinent negatives include no abdominal pain, chills, constipation, diarrhea, dysuria, fever, frequency, nausea, urgency or vomiting. Nothing aggravates the symptoms. She has tried nothing for the symptoms. She is sexually active. She uses nothing for contraception. Her menstrual history has been regular (07/21/15 ).     Past Medical History  Diagnosis Date  . Hypertension   . Preterm labor   . Urinary tract infection   . ZHYQMVHQ(469.6)     Past Surgical History  Procedure Laterality Date  . Cesarean section    . Dilation and curettage of uterus    . Therapeutic abortion    . 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 Diabetes Mother   . Diabetes Maternal Grandmother   . Hypertension Maternal Grandmother   . Cancer Maternal Grandmother     colon cancer, brain tumour  . Other Neg Hx     Social History  Substance Use Topics  . Smoking status: Former Smoker    Quit date: 01/15/2013  . Smokeless tobacco: Never Used  . Alcohol Use: No    Allergies: No Known Allergies  Prescriptions prior to admission  Medication Sig Dispense Refill Last Dose  . misoprostol (CYTOTEC) 200 MCG tablet Place 4 tablets (800 mcg total) vaginally once. 4 tablet 0   . naproxen (NAPROSYN) 500 MG  tablet Take 1 tablet (500 mg total) by mouth 2 (two) times daily. 30 tablet 0   . oxyCODONE-acetaminophen (PERCOCET/ROXICET) 5-325 MG tablet Take 1 tablet by mouth every 4 (four) hours as needed. 10 tablet 0     Review of Systems  Constitutional: Negative for fever and chills.  Gastrointestinal: Negative for nausea, vomiting, abdominal pain, diarrhea and constipation.  Genitourinary: Positive for pelvic pain. Negative for dysuria, urgency and frequency.   Physical Exam   Blood pressure 129/67, pulse 99, temperature 98.7 F (37.1 C), temperature source Oral, resp. rate 20, last menstrual period 07/21/2015, SpO2 100 %, unknown if currently breastfeeding.  Physical Exam  Nursing note and vitals reviewed. Constitutional: She is oriented to person, place, and time. She appears well-developed and well-nourished. No distress.  HENT:  Head: Normocephalic.  Cardiovascular: Normal rate.   Respiratory: Effort normal.  Musculoskeletal: Normal range of motion.  Neurological: She is alert and oriented to person, place, and time.  Skin: Skin is warm and dry.  Psychiatric: She has a normal mood and affect.    MAU Course  Procedures  MDM Patient advised that her pregnancy test here yesterday was negative, and that with +UPT at home recommend blood work to confirm. Also advised patient that it looks like she had a UTI based on urine yesterday.  2347: Patient no longer on the unit for lab to collect blood work. Left  AMA.  Will send medication to patient's pharmacy.   2350: Called patient's cell phone to let her know medication was sent to pharmacy. No answer. Left message for patient to call back.  Assessment and Plan   1. UTI (lower urinary tract infection)    Patient left AMA  RX: Macrobid BID #[id number]    Tawnya CrookHogan, Lazer Wollard Donovan 08/21/2015, 11:09 PM

## 2015-08-21 NOTE — MAU Note (Signed)
Not in lobby x2.

## 2015-08-21 NOTE — MAU Note (Signed)
Pt not in lobby.  

## 2015-08-21 NOTE — MAU Note (Addendum)
Pt here for pregnancy tests results from last night. States she had a +upt yesterday before she came in. Denies pain or vag bleeding. Denies urinary s/s.

## 2015-08-21 NOTE — MAU Note (Signed)
Pt not in lobby per lab tech.

## 2015-08-26 ENCOUNTER — Inpatient Hospital Stay (HOSPITAL_COMMUNITY)
Admission: AD | Admit: 2015-08-26 | Discharge: 2015-08-26 | Disposition: A | Discharge status: Home / Self Care | Payer: Medicaid Other | Source: Ambulatory Visit | Attending: Obstetrics | Admitting: Obstetrics

## 2015-08-26 ENCOUNTER — Encounter (HOSPITAL_COMMUNITY): Payer: Self-pay

## 2015-08-26 DIAGNOSIS — F17210 Nicotine dependence, cigarettes, uncomplicated: Secondary | ICD-10-CM | POA: Insufficient documentation

## 2015-08-26 DIAGNOSIS — Z3201 Encounter for pregnancy test, result positive: Secondary | ICD-10-CM | POA: Diagnosis not present

## 2015-08-26 DIAGNOSIS — N390 Urinary tract infection, site not specified: Secondary | ICD-10-CM | POA: Diagnosis not present

## 2015-08-26 DIAGNOSIS — O2341 Unspecified infection of urinary tract in pregnancy, first trimester: Secondary | ICD-10-CM

## 2015-08-26 LAB — URINALYSIS, ROUTINE W REFLEX MICROSCOPIC
BILIRUBIN URINE: NEGATIVE
Glucose, UA: NEGATIVE mg/dL
HGB URINE DIPSTICK: NEGATIVE
Ketones, ur: NEGATIVE mg/dL
Leukocytes, UA: NEGATIVE
NITRITE: NEGATIVE
Protein, ur: NEGATIVE mg/dL
SPECIFIC GRAVITY, URINE: 1.025 (ref 1.005–1.030)
pH: 6 (ref 5.0–8.0)

## 2015-08-26 LAB — POCT PREGNANCY, URINE: PREG TEST UR: POSITIVE — AB

## 2015-08-26 NOTE — MAU Note (Signed)
Was here before and was told she had neg pregnancy test and had a UTI.  Did not stay for prescription.   Positive pregnancy test at home and would like to get prescription for UTI.

## 2015-08-26 NOTE — MAU Provider Note (Signed)
History     CSN: [id number]  Arrival date and time: 08/26/15 1013   First Provider Initiated Contact with Patient 08/26/15 1039        Chief Complaint  Patient presents with  . UTI treatment and pregnancy test.    HPI [name] is a 25 y.o. [phone] female who presents for UTI treatment & UPT. Reports positive UPT today. Denies abdominal pain or vaginal bleeding.  Was diagnosed with UTI during previous visit earlier this week but left AMA prior to receiving abx prescription. Per note, CNM called pt & left VM informing her that abx was sent electronically to her pharmacy but patient states she didn't get that message.  Denies dysuria, hematuria, n/v, flank pain, or fever/chills.   OB History    Gravida Para Term Preterm AB TAB SAB Ectopic Multiple Living   Past Medical History  Diagnosis Date  . Hypertension   . Preterm labor   . Urinary tract infection   . BMWUXLKG(401.0)     Past Surgical History  Procedure Laterality Date  . Cesarean section    . Dilation and curettage of uterus    . Therapeutic abortion    . 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 Diabetes Mother   . Diabetes Maternal Grandmother   . Hypertension Maternal Grandmother   . Cancer Maternal Grandmother     colon cancer, brain tumour  . Other Neg Hx     Social History  Substance Use Topics  . Smoking status: Current Some Day Smoker -- 0.25 packs/day    Types: Cigarettes  . Smokeless tobacco: Never Used  . Alcohol Use: No    Allergies: No Known Allergies  Prescriptions prior to admission  Medication Sig Dispense Refill Last Dose  . misoprostol (CYTOTEC) 200 MCG tablet Place 4 tablets (800 mcg total) vaginally once. 4 tablet 0   . naproxen (NAPROSYN) 500 MG tablet Take 1 tablet (500 mg total) by mouth 2 (two) times daily. 30 tablet 0   . nitrofurantoin, macrocrystal-monohydrate, (MACROBID) 100 MG  capsule Take 1 capsule (100 mg total) by mouth 2 (two) times daily. 10 capsule 0   . oxyCODONE-acetaminophen (PERCOCET/ROXICET) 5-325 MG tablet Take 1 tablet by mouth every 4 (four) hours as needed. 10 tablet 0     Review of Systems  Constitutional: Negative for fever and chills.  Gastrointestinal: Negative for nausea, vomiting and abdominal pain.  Genitourinary: Negative for dysuria, frequency, hematuria and flank pain.       No vaginal bleeding  Musculoskeletal: Negative for back pain.   Physical Exam   Blood pressure 146/88, pulse 87, temperature 98.7 F (37.1 C), temperature source Oral, resp. rate 18, height  (1.626 m), weight 248 lb 3.2 oz (112.583 kg), last menstrual period 07/21/2015, unknown if currently breastfeeding.  Physical Exam  Nursing note and vitals reviewed. Constitutional: She is oriented to person, place, and time. She appears well-developed and well-nourished. No distress.  HENT:  Head: Normocephalic and atraumatic.  Eyes: Conjunctivae are normal. Right eye exhibits no discharge. Left eye exhibits no discharge. No scleral icterus.  Neck: Normal range of motion.  Cardiovascular: Normal rate, regular rhythm and normal heart sounds.   No murmur heard. Respiratory: Effort normal and breath sounds normal. No respiratory distress. She has no wheezes.  GI: Soft. Bowel sounds are  normal. She exhibits no distension. There is no tenderness. There is no CVA tenderness.  Neurological: She is alert and oriented to person, place, and time.  Skin: Skin is warm and dry. She is not diaphoretic.  Psychiatric: She has a normal mood and affect. Her behavior is normal. Judgment and thought content normal.    MAU Course  Procedures Results for orders placed or performed during the hospital encounter of 08/26/15 (from the past 24 hour(s))  Urinalysis, Routine w reflex microscopic ([facility])     Status: None   Collection Time: 08/26/15 10:16 AM  Result Value Ref Range    Color, Urine YELLOW YELLOW   APPearance CLEAR CLEAR   Specific Gravity, Urine 1.025 1.005 - 1.030   pH 6.0 5.0 - 8.0   Glucose, UA NEGATIVE NEGATIVE mg/dL   Hgb urine dipstick NEGATIVE NEGATIVE   Bilirubin Urine NEGATIVE NEGATIVE   Ketones, ur NEGATIVE NEGATIVE mg/dL   Protein, ur NEGATIVE NEGATIVE mg/dL   Nitrite NEGATIVE NEGATIVE   Leukocytes, UA NEGATIVE NEGATIVE  Pregnancy, urine POC     Status: Abnormal   Collection Time: 08/26/15 10:33 AM  Result Value Ref Range   Preg Test, Ur POSITIVE (A) NEGATIVE    MDM UPT positive Urine culture sent Pt plans on getting care with Dr. Clearance [name] -- last saw him in February.   Assessment and Plan  A: 1. UTI in pregnancy, antepartum, first trimester   2. Positive pregnancy test     P; Discharge home Start abx as previously prescribed Discussed reasons to return to MAU Start prenatal care Ob urine culture pending  Judeth Hornrin Daryel Kenneth 08/26/2015, 10:37 AM

## 2015-08-26 NOTE — Discharge Instructions (Signed)
Pregnancy and Urinary Tract Infection  A urinary tract infection (UTI) is a bacterial infection of the urinary tract. Infection of the urinary tract can include the ureters, kidneys (pyelonephritis), bladder (cystitis), and urethra (urethritis). All pregnant women should be screened for bacteria in the urinary tract. Identifying and treating a UTI will decrease the risk of preterm labor and developing more serious infections in both the mother and baby.  CAUSES  Bacteria germs cause almost all UTIs.   RISK FACTORS  Many factors can increase your chances of getting a UTI during pregnancy. These include:  · Having a short urethra.  · Poor toilet and hygiene habits.  · Sexual intercourse.  · Blockage of urine along the urinary tract.  · Problems with the pelvic muscles or nerves.  · Diabetes.  · Obesity.  · Bladder problems after having several children.  · Previous history of UTI.  SIGNS AND SYMPTOMS   · Pain, burning, or a stinging feeling when urinating.  · Suddenly feeling the need to urinate right away (urgency).  · Loss of bladder control (urinary incontinence).  · Frequent urination, more than is common with pregnancy.  · Lower abdominal or back discomfort.  · Cloudy urine.  · Blood in the urine (hematuria).  · Fever.   When the kidneys are infected, the symptoms may be:  · Back pain.  · Flank pain on the right side more so than the left.  · Fever.  · Chills.  · Nausea.  · Vomiting.  DIAGNOSIS   A urinary tract infection is usually diagnosed through urine tests. Additional tests and procedures are sometimes done. These may include:  · Ultrasound exam of the kidneys, ureters, bladder, and urethra.  · Looking in the bladder with a lighted tube (cystoscopy).  TREATMENT  Typically, UTIs can be treated with antibiotic medicines.   HOME CARE INSTRUCTIONS   · Only take over-the-counter or prescription medicines as directed by your health care provider. If you were prescribed antibiotics, take them as directed. Finish  them even if you start to feel better.  · Drink enough fluids to keep your urine clear or pale yellow.  · Do not have sexual intercourse until the infection is gone and your health care provider says it is okay.  · Make sure you are tested for UTIs throughout your pregnancy. These infections often come back.   Preventing a UTI in the Future  · Practice good toilet habits. Always wipe from front to back. Use the tissue only once.  · Do not hold your urine. Empty your bladder as soon as possible when the urge comes.  · Do not douche or use deodorant sprays.  · Wash with soap and warm water around the genital area and the anus.  · Empty your bladder before and after sexual intercourse.  · Wear underwear with a cotton crotch.  · Avoid caffeine and carbonated drinks. They can irritate the bladder.  · Drink cranberry juice or take cranberry pills. This may decrease the risk of getting a UTI.  · Do not drink alcohol.  · Keep all your appointments and tests as scheduled.   SEEK MEDICAL CARE IF:   · Your symptoms get worse.  · You are still having fevers 2 or more days after treatment begins.  · You have a rash.  · You feel that you are having problems with medicines prescribed.  · You have abnormal vaginal discharge.  SEEK IMMEDIATE MEDICAL CARE IF:   · You have back or flank   pain.  · You have chills.  · You have blood in your urine.  · You have nausea and vomiting.  · You have contractions of your uterus.  · You have a gush of fluid from the vagina.  MAKE SURE YOU:  · Understand these instructions.    · Will watch your condition.    · Will get help right away if you are not doing well or get worse.       This information is not intended to replace advice given to you by your health care provider. Make sure you discuss any questions you have with your health care provider.     Document Released: 07/21/2010 Document Revised: 01/14/2013 Document Reviewed: 10/23/2012  Elsevier Interactive Patient Education ©2016 [employer]

## 2015-08-28 LAB — CULTURE, OB URINE

## 2015-08-29 ENCOUNTER — Telehealth: Payer: Self-pay | Admitting: Obstetrics and Gynecology

## 2015-08-29 MED ORDER — CEPHALEXIN 500 MG PO CAPS: 500.0000 mg | ORAL_CAPSULE | Freq: Four times a day (QID) | ORAL | Status: DC

## 2015-08-29 NOTE — Telephone Encounter (Signed)
Patient on macrobid for UTI, urine culture shows resistance. Keflex called in QID X 5 days.

## 2015-09-01 ENCOUNTER — Encounter (HOSPITAL_COMMUNITY): Payer: Self-pay | Admitting: *Deleted

## 2015-09-01 ENCOUNTER — Inpatient Hospital Stay (HOSPITAL_COMMUNITY): Payer: Medicaid Other

## 2015-09-01 ENCOUNTER — Inpatient Hospital Stay (HOSPITAL_COMMUNITY)
Admission: AD | Admit: 2015-09-01 | Discharge: 2015-09-01 | Disposition: A | Discharge status: Home / Self Care | Payer: Medicaid Other | Source: Ambulatory Visit | Attending: Obstetrics | Admitting: Obstetrics

## 2015-09-01 DIAGNOSIS — O99331 Smoking (tobacco) complicating pregnancy, first trimester: Secondary | ICD-10-CM | POA: Diagnosis not present

## 2015-09-01 DIAGNOSIS — O26899 Other specified pregnancy related conditions, unspecified trimester: Secondary | ICD-10-CM

## 2015-09-01 DIAGNOSIS — O10911 Unspecified pre-existing hypertension complicating pregnancy, first trimester: Secondary | ICD-10-CM | POA: Insufficient documentation

## 2015-09-01 DIAGNOSIS — R109 Unspecified abdominal pain: Secondary | ICD-10-CM | POA: Diagnosis present

## 2015-09-01 DIAGNOSIS — Z3A01 Less than 8 weeks gestation of pregnancy: Secondary | ICD-10-CM | POA: Insufficient documentation

## 2015-09-01 DIAGNOSIS — O26891 Other specified pregnancy related conditions, first trimester: Secondary | ICD-10-CM | POA: Insufficient documentation

## 2015-09-01 DIAGNOSIS — B9689 Other specified bacterial agents as the cause of diseases classified elsewhere: Secondary | ICD-10-CM | POA: Diagnosis not present

## 2015-09-01 DIAGNOSIS — O23591 Infection of other part of genital tract in pregnancy, first trimester: Secondary | ICD-10-CM

## 2015-09-01 DIAGNOSIS — N760 Acute vaginitis: Secondary | ICD-10-CM

## 2015-09-01 DIAGNOSIS — O3680X0 Pregnancy with inconclusive fetal viability, not applicable or unspecified: Secondary | ICD-10-CM

## 2015-09-01 LAB — URINALYSIS, ROUTINE W REFLEX MICROSCOPIC
BILIRUBIN URINE: NEGATIVE
Glucose, UA: NEGATIVE mg/dL
HGB URINE DIPSTICK: NEGATIVE
KETONES UR: NEGATIVE mg/dL
Leukocytes, UA: NEGATIVE
Nitrite: NEGATIVE
PROTEIN: NEGATIVE mg/dL
Specific Gravity, Urine: 1.025 (ref 1.005–1.030)
pH: 6 (ref 5.0–8.0)

## 2015-09-01 LAB — WET PREP, GENITAL
Sperm: NONE SEEN
Trich, Wet Prep: NONE SEEN
YEAST WET PREP: NONE SEEN

## 2015-09-01 LAB — CBC
HCT: 36.4 % (ref 36.0–46.0)
Hemoglobin: 12.2 g/dL (ref 12.0–15.0)
MCH: 29.1 pg (ref 26.0–34.0)
MCHC: 33.5 g/dL (ref 30.0–36.0)
MCV: 86.9 fL (ref 78.0–100.0)
PLATELETS: 304 10*3/uL (ref 150–400)
RBC: 4.19 MIL/uL (ref 3.87–5.11)
RDW: 13.4 % (ref 11.5–15.5)
WBC: 6.1 10*3/uL (ref 4.0–10.5)

## 2015-09-01 LAB — HCG, QUANTITATIVE, PREGNANCY: HCG, BETA CHAIN, QUANT, S: 2506 m[IU]/mL — AB (ref <5)

## 2015-09-01 NOTE — MAU Note (Signed)
Pt states has been having lower stomach cramping and sides.  Pt denies vaginal bleeding/discharge.

## 2015-09-01 NOTE — Discharge Instructions (Signed)

## 2015-09-01 NOTE — MAU Provider Note (Signed)
History     CSN: [id number]  Arrival date and time: 09/01/15 1115   First Provider Initiated Contact with Patient 09/01/15 1213      Chief Complaint  Patient presents with  . Abdominal Pain   HPI Pt is [name] who presents with abdominal cramping for 2 days.  Pt states the pain is cramping pain  That lasts for about 10 minutes - intermittent.  - not at present Pt previously had SAB in 06/2015 and is concerned about this pregnancy since she had cramping last pregnancy before miscarriage occurred.   Pt denies spotting, bleeding, vaginal discharge, nausea, vomiting, constipation or diarrhea. Pt found out she was pregnant 08/19/2015 RN note:     Expand All Collapse All   Pt states has been having lower stomach cramping and sides. Pt denies vaginal bleeding/discharge.      Past Medical History  Diagnosis Date  . Hypertension   . Preterm labor   . Urinary tract infection   . JYNWGNFA(213.0)     Past Surgical History  Procedure Laterality Date  . Cesarean section    . Dilation and curettage of uterus    . Therapeutic abortion    . 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 Diabetes Mother   . Diabetes Maternal Grandmother   . Hypertension Maternal Grandmother   . Cancer Maternal Grandmother     colon cancer, brain tumour  . Other Neg Hx     Social History  Substance Use Topics  . Smoking status: Current Some Day Smoker -- 0.25 packs/day    Types: Cigarettes  . Smokeless tobacco: Never Used  . Alcohol Use: No    Allergies: No Known Allergies  Prescriptions prior to admission  Medication Sig Dispense Refill Last Dose  . cephALEXin (KEFLEX) 500 MG capsule Take 1 capsule (500 mg total) by mouth 4 (four) times daily. 20 capsule 0 08/31/2015 at Unknown time  . nitrofurantoin, macrocrystal-monohydrate, (MACROBID) 100 MG capsule Take 1 capsule (100 mg total) by mouth 2 (two) times daily. (Patient  not taking: Reported on 08/26/2015) 10 capsule 0 Completed Course at Unknown time    Review of Systems  Constitutional: Negative for fever and chills.  Gastrointestinal: Positive for abdominal pain. Negative for nausea, vomiting, diarrhea and constipation.  Genitourinary: Negative for dysuria.  Neurological: Negative for headaches.   Physical Exam   Blood pressure 108/70, pulse 81, temperature 98.7 F (37.1 C), temperature source Oral, resp. rate 18, last menstrual period 07/21/2015, unknown if currently breastfeeding.  Physical Exam  Nursing note and vitals reviewed. Constitutional: She is oriented to person, place, and time. She appears well-developed and well-nourished. No distress.  HENT:  Head: Normocephalic.  Eyes: Pupils are equal, round, and reactive to light.  Neck: Normal range of motion. Neck supple.  Cardiovascular: Normal rate.   Respiratory: Effort normal.  GI: Soft. She exhibits no distension. There is no tenderness. There is no rebound and no guarding.  Genitourinary:  Small amount of white discharge in vault; cervix nullip (previous C-section); ; closed; uterus retroverted NT; adnexa without palpable enlargement  Musculoskeletal: Normal range of motion.  Neurological: She is alert and oriented to person, place, and time.  Skin: Skin is warm and dry.  Psychiatric: She has a normal mood and affect.    MAU Course  Procedures Results for orders placed or performed during the hospital encounter of 09/01/15 (from the past 24 hour(s))  Urinalysis, Routine w reflex microscopic (not [facility])     Status: None   Collection Time: 09/01/15 11:30 AM  Result Value Ref Range   Color, Urine YELLOW YELLOW   APPearance CLEAR CLEAR   Specific Gravity, Urine 1.025 1.005 - 1.030   pH 6.0 5.0 - 8.0   Glucose, UA NEGATIVE NEGATIVE mg/dL   Hgb urine dipstick NEGATIVE NEGATIVE   Bilirubin Urine NEGATIVE NEGATIVE   Ketones, ur NEGATIVE NEGATIVE mg/dL   Protein, ur NEGATIVE NEGATIVE  mg/dL   Nitrite NEGATIVE NEGATIVE   Leukocytes, UA NEGATIVE NEGATIVE  Wet prep, genital     Status: Abnormal   Collection Time: 09/01/15 12:30 PM  Result Value Ref Range   Yeast Wet Prep HPF POC NONE SEEN NONE SEEN   Trich, Wet Prep NONE SEEN NONE SEEN   Clue Cells Wet Prep HPF POC PRESENT (A) NONE SEEN   WBC, Wet Prep HPF POC MODERATE (A) NONE SEEN   Sperm NONE SEEN   CBC     Status: None   Collection Time: 09/01/15 12:37 PM  Result Value Ref Range   WBC 6.1 4.0 - 10.5 K/uL   RBC 4.19 3.87 - 5.11 MIL/uL   Hemoglobin 12.2 12.0 - 15.0 g/dL   HCT 16.136.4 09.636.0 - 04.546.0 %   MCV 86.9 78.0 - 100.0 fL   MCH 29.1 26.0 - 34.0 pg   MCHC 33.5 30.0 - 36.0 g/dL   RDW 40.913.4 81.111.5 - 91.415.5 %   Platelets 304 150 - 400 K/uL   Results for orders placed or performed during the hospital encounter of 09/01/15 (from the past 48 hour(s))  Urinalysis, Routine w reflex microscopic (not at [facility])     Status: None   Collection Time: 09/01/15 11:30 AM  Result Value Ref Range   Color, Urine YELLOW YELLOW   APPearance CLEAR CLEAR   Specific Gravity, Urine 1.025 1.005 - 1.030   pH 6.0 5.0 - 8.0   Glucose, UA NEGATIVE NEGATIVE mg/dL   Hgb urine dipstick NEGATIVE NEGATIVE   Bilirubin Urine NEGATIVE NEGATIVE   Ketones, ur NEGATIVE NEGATIVE mg/dL   Protein, ur NEGATIVE NEGATIVE mg/dL   Nitrite NEGATIVE NEGATIVE   Leukocytes, UA NEGATIVE NEGATIVE    Comment: MICROSCOPIC NOT DONE ON URINES WITH NEGATIVE PROTEIN, BLOOD, LEUKOCYTES, NITRITE, OR GLUCOSE <1000 mg/dL.  Wet prep, genital     Status: Abnormal   Collection Time: 09/01/15 12:30 PM  Result Value Ref Range   Yeast Wet Prep HPF POC NONE SEEN NONE SEEN   Trich, Wet Prep NONE SEEN NONE SEEN   Clue Cells Wet Prep HPF POC PRESENT (A) NONE SEEN   WBC, Wet Prep HPF POC MODERATE (A) NONE SEEN    Comment: MANY BACTERIA SEEN   Sperm NONE SEEN   CBC     Status: None   Collection Time: 09/01/15 12:37 PM  Result Value Ref Range   WBC 6.1 4.0 - 10.5 K/uL   RBC 4.19  3.87 - 5.11 MIL/uL   Hemoglobin 12.2 12.0 - 15.0 g/dL   HCT 78.236.4 95.636.0 - 21.346.0 %   MCV 86.9 78.0 - 100.0 fL   MCH 29.1 26.0 - 34.0 pg   MCHC 33.5 30.0 - 36.0 g/dL   RDW 08.613.4 57.811.5 - 46.915.5 %   Platelets 304 150 - 400 K/uL  hCG, quantitative, pregnancy     Status: Abnormal   Collection Time: 09/01/15 12:37 PM  Result Value Ref Range   hCG, Beta Chain, Quant, S 2506 (  H) <5 mIU/mL    Comment:          GEST. AGE      CONC.  (mIU/mL)   <=1 WEEK        5 - 50     2 WEEKS       50 - 500     3 WEEKS       100 - 10,000     4 WEEKS     1,000 - 30,000     5 WEEKS     3,500 - 115,000   6-8 WEEKS     12,000 - 270,000    12 WEEKS     15,000 - 220,000        FEMALE AND NON-PREGNANT FEMALE:     LESS THAN 5 mIU/mL    Results for orders placed or performed during the hospital encounter of 09/01/15 (from the past 48 hour(s))  Urinalysis, Routine w reflex microscopic (not at [facility])     Status: None   Collection Time: 09/01/15 11:30 AM  Result Value Ref Range   Color, Urine YELLOW YELLOW   APPearance CLEAR CLEAR   Specific Gravity, Urine 1.025 1.005 - 1.030   pH 6.0 5.0 - 8.0   Glucose, UA NEGATIVE NEGATIVE mg/dL   Hgb urine dipstick NEGATIVE NEGATIVE   Bilirubin Urine NEGATIVE NEGATIVE   Ketones, ur NEGATIVE NEGATIVE mg/dL   Protein, ur NEGATIVE NEGATIVE mg/dL   Nitrite NEGATIVE NEGATIVE   Leukocytes, UA NEGATIVE NEGATIVE    Comment: MICROSCOPIC NOT DONE ON URINES WITH NEGATIVE PROTEIN, BLOOD, LEUKOCYTES, NITRITE, OR GLUCOSE <1000 mg/dL.  Wet prep, genital     Status: Abnormal   Collection Time: 09/01/15 12:30 PM  Result Value Ref Range   Yeast Wet Prep HPF POC NONE SEEN NONE SEEN   Trich, Wet Prep NONE SEEN NONE SEEN   Clue Cells Wet Prep HPF POC PRESENT (A) NONE SEEN   WBC, Wet Prep HPF POC MODERATE (A) NONE SEEN    Comment: MANY BACTERIA SEEN   Sperm NONE SEEN   CBC     Status: None   Collection Time: 09/01/15 12:37 PM  Result Value Ref Range   WBC 6.1 4.0 - 10.5 K/uL   RBC 4.19 3.87 -  5.11 MIL/uL   Hemoglobin 12.2 12.0 - 15.0 g/dL   HCT 16.1 09.6 - 04.5 %   MCV 86.9 78.0 - 100.0 fL   MCH 29.1 26.0 - 34.0 pg   MCHC 33.5 30.0 - 36.0 g/dL   RDW 40.9 81.1 - 91.4 %   Platelets 304 150 - 400 K/uL  hCG, quantitative, pregnancy     Status: Abnormal   Collection Time: 09/01/15 12:37 PM  Result Value Ref Range   hCG, Beta Chain, Quant, S 2506 (H) <5 mIU/mL    Comment:          GEST. AGE      CONC.  (mIU/mL)   <=1 WEEK        5 - 50     2 WEEKS       50 - 500     3 WEEKS       100 - 10,000     4 WEEKS     1,000 - 30,000     5 WEEKS     3,500 - 115,000   6-8 WEEKS     12,000 - 270,000    12 WEEKS     15,000 - 220,000  FEMALE AND NON-PREGNANT FEMALE:     LESS THAN 5 mIU/mL   GC/chlamydia pending Dr. Debroah Loop consulted  Assessment and Plan  abd pain in early pregnancy Pregnancy of unknown location- repeat HCG in 48 hours May 27 Ectopic precautions; pt to return if increase in pain or bleeding BV- continue Clindamycin as prescribed   Talan Gildner 09/01/2015, 12:13 PM

## 2015-09-02 ENCOUNTER — Encounter: Payer: Medicaid Other | Admitting: Certified Nurse Midwife

## 2015-09-02 LAB — GC/CHLAMYDIA PROBE AMP (~~LOC~~) NOT AT ARMC
CHLAMYDIA, DNA PROBE: NEGATIVE
Neisseria Gonorrhea: NEGATIVE

## 2015-09-03 ENCOUNTER — Encounter (HOSPITAL_COMMUNITY): Payer: Self-pay

## 2015-09-03 ENCOUNTER — Inpatient Hospital Stay (HOSPITAL_COMMUNITY)
Admission: AD | Admit: 2015-09-03 | Discharge: 2015-09-03 | Disposition: A | Discharge status: Home / Self Care | Payer: Medicaid Other | Source: Ambulatory Visit | Attending: Obstetrics | Admitting: Obstetrics

## 2015-09-03 DIAGNOSIS — O26891 Other specified pregnancy related conditions, first trimester: Secondary | ICD-10-CM | POA: Diagnosis present

## 2015-09-03 DIAGNOSIS — O3680X0 Pregnancy with inconclusive fetal viability, not applicable or unspecified: Secondary | ICD-10-CM

## 2015-09-03 DIAGNOSIS — Z3A01 Less than 8 weeks gestation of pregnancy: Secondary | ICD-10-CM | POA: Insufficient documentation

## 2015-09-03 DIAGNOSIS — R109 Unspecified abdominal pain: Secondary | ICD-10-CM | POA: Diagnosis not present

## 2015-09-03 DIAGNOSIS — O26899 Other specified pregnancy related conditions, unspecified trimester: Secondary | ICD-10-CM

## 2015-09-03 DIAGNOSIS — O0281 Inappropriate change in quantitative human chorionic gonadotropin (hCG) in early pregnancy: Secondary | ICD-10-CM

## 2015-09-03 DIAGNOSIS — O9989 Other specified diseases and conditions complicating pregnancy, childbirth and the puerperium: Secondary | ICD-10-CM | POA: Diagnosis not present

## 2015-09-03 LAB — HCG, QUANTITATIVE, PREGNANCY: hCG, Beta Chain, Quant, S: 3978 m[IU]/mL — ABNORMAL HIGH (ref <5)

## 2015-09-03 NOTE — MAU Provider Note (Signed)
S:  [name] is a 25 y.o. female [phone] @ 3226w2d here in MAU for a follow up beta hcg level. She was seen in the MAU two days ago with abdominal cramping. Review of US shows a gestational sac only, with no yolk sac.   Currently today she denies pain or bleeding. She is scheduled to see Dr. Clearance [name] on Tuesday for her first OB appointment.    O:  GENERAL: Well-developed, well-nourished female in no acute distress.  LUNGS: Effort normal SKIN: Warm, dry and without erythema PSYCH: Normal mood and affect  Filed Vitals:   09/03/15 1036  BP: 118/68  Pulse: 89  Temp: 98.2 F (36.8 C)  Resp: 18    MDM:  Beta hcg level 5/25: 2506 Beta hcg level 5/27: 3978   A:  1. Pregnancy of unknown anatomic location   2. Elevated level of quantitative hCG for gestational age in early pregnancy   3. Abdominal pain in pregnancy     P:  Discharge home in stable condition Pelvic rest Ectopic precautions US in 7-10 days; patient desires to follow up with Dr. Clearance [name] after US Return to MAU if symptoms worsen    Duane LopeJennifer I Ronrico Dupin, NP 09/03/2015 12:02 PM

## 2015-09-03 NOTE — Discharge Instructions (Signed)

## 2015-09-03 NOTE — MAU Note (Signed)
Denies vaginal bleeding or pain.

## 2015-09-06 ENCOUNTER — Encounter: Payer: Self-pay | Admitting: Certified Nurse Midwife

## 2015-09-06 ENCOUNTER — Ambulatory Visit: Payer: Medicaid Other | Admitting: Certified Nurse Midwife

## 2015-09-06 VITALS — BP 110/68 | HR 78 | Temp 98.3°F | Wt 240.0 lb

## 2015-09-06 DIAGNOSIS — O2620 Pregnancy care for patient with recurrent pregnancy loss, unspecified trimester: Secondary | ICD-10-CM

## 2015-09-06 MED ORDER — CITRANATAL HARMONY 27-1-260 MG PO CAPS: 1.0000 | ORAL_CAPSULE | Freq: Every day | ORAL | Status: DC

## 2015-09-07 LAB — HEMOGLOBINOPATHY EVALUATION
HEMOGLOBIN A2 QUANTITATION: 2.5 % (ref 0.7–3.1)
HEMOGLOBIN F QUANTITATION: 0 % (ref 0.0–2.0)
HGB A: 97.5 % (ref 94.0–98.0)
HGB C: 0 %
HGB S: 0 %

## 2015-09-07 LAB — PRENATAL PROFILE I(LABCORP)
Antibody Screen: NEGATIVE
BASOS ABS: 0 10*3/uL (ref 0.0–0.2)
Basos: 0 %
EOS (ABSOLUTE): 0.1 10*3/uL (ref 0.0–0.4)
EOS: 1 %
Hematocrit: 36 % (ref 34.0–46.6)
Hemoglobin: 11.8 g/dL (ref 11.1–15.9)
Hepatitis B Surface Ag: NEGATIVE
Immature Grans (Abs): 0 10*3/uL (ref 0.0–0.1)
Immature Granulocytes: 0 %
LYMPHS ABS: 2.1 10*3/uL (ref 0.7–3.1)
Lymphs: 37 %
MCH: 28.9 pg (ref 26.6–33.0)
MCHC: 32.8 g/dL (ref 31.5–35.7)
MCV: 88 fL (ref 79–97)
MONOS ABS: 0.3 10*3/uL (ref 0.1–0.9)
Monocytes: 5 %
NEUTROS ABS: 3.3 10*3/uL (ref 1.4–7.0)
Neutrophils: 57 %
PLATELETS: 332 10*3/uL (ref 150–379)
RBC: 4.08 x10E6/uL (ref 3.77–5.28)
RDW: 13.3 % (ref 12.3–15.4)
RH TYPE: POSITIVE
RPR Ser Ql: NONREACTIVE
Rubella Antibodies, IGG: 2.68 index (ref >0.99)
WBC: 5.8 10*3/uL (ref 3.4–10.8)

## 2015-09-07 LAB — VITAMIN D 25 HYDROXY (VIT D DEFICIENCY, FRACTURES): Vit D, 25-Hydroxy: 24 ng/mL — ABNORMAL LOW (ref 30.0–100.0)

## 2015-09-07 LAB — HIV ANTIBODY (ROUTINE TESTING W REFLEX): HIV Screen 4th Generation wRfx: NONREACTIVE

## 2015-09-07 LAB — BETA HCG QUANT (REF LAB): HCG QUANT: 4857 m[IU]/mL

## 2015-09-07 LAB — VARICELLA ZOSTER ANTIBODY, IGG: Varicella zoster IgG: 677 index (ref >165)

## 2015-09-08 NOTE — Progress Notes (Signed)
Seen for a nurse visit.  Initial ob visit scheduled.

## 2015-09-09 ENCOUNTER — Telehealth: Payer: Self-pay | Admitting: *Deleted

## 2015-09-09 NOTE — Telephone Encounter (Signed)
Pt called to office about PNV Rx.  Return call to pt. Pt made aware PNV was sent to pharmacy on 09-06-15.

## 2015-09-16 ENCOUNTER — Ambulatory Visit (HOSPITAL_COMMUNITY)
Admission: RE | Admit: 2015-09-16 | Discharge: 2015-09-16 | Disposition: A | Discharge status: Home / Self Care | Payer: Medicaid Other | Source: Ambulatory Visit | Attending: Certified Nurse Midwife | Admitting: Certified Nurse Midwife

## 2015-09-16 DIAGNOSIS — O209 Hemorrhage in early pregnancy, unspecified: Secondary | ICD-10-CM | POA: Insufficient documentation

## 2015-09-16 DIAGNOSIS — O26899 Other specified pregnancy related conditions, unspecified trimester: Secondary | ICD-10-CM

## 2015-09-16 DIAGNOSIS — O9989 Other specified diseases and conditions complicating pregnancy, childbirth and the puerperium: Secondary | ICD-10-CM | POA: Diagnosis not present

## 2015-09-16 DIAGNOSIS — R109 Unspecified abdominal pain: Secondary | ICD-10-CM | POA: Insufficient documentation

## 2015-09-16 DIAGNOSIS — O3680X0 Pregnancy with inconclusive fetal viability, not applicable or unspecified: Secondary | ICD-10-CM | POA: Insufficient documentation

## 2015-09-16 DIAGNOSIS — Z331 Pregnant state, incidental: Secondary | ICD-10-CM | POA: Diagnosis present

## 2015-09-20 ENCOUNTER — Ambulatory Visit (INDEPENDENT_AMBULATORY_CARE_PROVIDER_SITE_OTHER): Payer: Medicaid Other | Admitting: Certified Nurse Midwife

## 2015-09-20 ENCOUNTER — Inpatient Hospital Stay (HOSPITAL_COMMUNITY)
Admission: AD | Admit: 2015-09-20 | Discharge: 2015-09-20 | Disposition: A | Discharge status: Home / Self Care | Payer: Medicaid Other | Source: Ambulatory Visit | Attending: Obstetrics | Admitting: Obstetrics

## 2015-09-20 ENCOUNTER — Encounter (HOSPITAL_COMMUNITY): Payer: Self-pay | Admitting: *Deleted

## 2015-09-20 VITALS — BP 129/78 | HR 88 | Wt 240.0 lb

## 2015-09-20 DIAGNOSIS — O0991 Supervision of high risk pregnancy, unspecified, first trimester: Secondary | ICD-10-CM

## 2015-09-20 DIAGNOSIS — Z1389 Encounter for screening for other disorder: Secondary | ICD-10-CM

## 2015-09-20 DIAGNOSIS — Z331 Pregnant state, incidental: Secondary | ICD-10-CM

## 2015-09-20 DIAGNOSIS — Z5320 Procedure and treatment not carried out because of patient's decision for unspecified reasons: Secondary | ICD-10-CM | POA: Insufficient documentation

## 2015-09-20 DIAGNOSIS — R109 Unspecified abdominal pain: Secondary | ICD-10-CM | POA: Diagnosis present

## 2015-09-20 DIAGNOSIS — O3680X1 Pregnancy with inconclusive fetal viability, fetus 1: Secondary | ICD-10-CM | POA: Diagnosis not present

## 2015-09-20 LAB — POCT URINALYSIS DIPSTICK
Bilirubin, UA: NEGATIVE
Blood, UA: NEGATIVE
Glucose, UA: NEGATIVE
Ketones, UA: NEGATIVE
LEUKOCYTES UA: NEGATIVE
Nitrite, UA: NEGATIVE
SPEC GRAV UA: 1.025
UROBILINOGEN UA: NEGATIVE
pH, UA: 5

## 2015-09-20 LAB — CBC
HEMATOCRIT: 36.9 % (ref 36.0–46.0)
HEMOGLOBIN: 12.3 g/dL (ref 12.0–15.0)
MCH: 28.9 pg (ref 26.0–34.0)
MCHC: 33.3 g/dL (ref 30.0–36.0)
MCV: 86.6 fL (ref 78.0–100.0)
Platelets: 315 10*3/uL (ref 150–400)
RBC: 4.26 MIL/uL (ref 3.87–5.11)
RDW: 13.2 % (ref 11.5–15.5)
WBC: 9.2 10*3/uL (ref 4.0–10.5)

## 2015-09-20 LAB — HCG, QUANTITATIVE, PREGNANCY: HCG, BETA CHAIN, QUANT, S: 21417 m[IU]/mL — AB (ref <5)

## 2015-09-20 NOTE — MAU Note (Signed)
NOT IN LOBBY 

## 2015-09-20 NOTE — MAU Note (Signed)
PT SAYS SHE STARTED HAVING  CRAMPS  AT 425PM-    WHILE  IN BED      [facility]  WAS   THERE  TODAY-       DENNY TOLD  PT  SHE WAS 9 WEEKS- AND WOULD PROBABLY  HAVE SAB.   -  [facility]    NO BLEEDING.  LAST SEX-   IN MAY

## 2015-09-20 NOTE — Progress Notes (Signed)
Patient ID: Breanna EmeryMary E Moreno, female   DOB: Jan 03, 1991, 25 y.o.   MRN: [id number]  Chief Complaint  Patient presents with  . Routine Prenatal Visit    HPI [name] is a 25 y.o. female.   Denies any vaginal bleeding/cramping today.  States that this happened with last pregnancy and she miscarried at 9 weeks.   Reviewed last US with patient from June 2nd.  Patient verbalized understanding with POC and f/u US.   HPI  Past Medical History  Diagnosis Date  . Hypertension   . Preterm labor   . Urinary tract infection   . GEXBMWUX(324.4Headache(784.0)     Past Surgical History  Procedure Laterality Date  . Cesarean section    . Dilation and curettage of uterus    . Therapeutic abortion    . 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 Diabetes Mother   . Diabetes Maternal Grandmother   . Hypertension Maternal Grandmother   . Cancer Maternal Grandmother     colon cancer, brain tumour  . Other Neg Hx     Social History Social History  Substance Use Topics  . Smoking status: Former Smoker -- 0.25 packs/day    Types: Cigarettes  . Smokeless tobacco: Never Used  . Alcohol Use: No    No Known Allergies  Current Outpatient Prescriptions  Medication Sig Dispense Refill  . Prenat-FeFmCb-DSS-FA-DHA w/o A (CITRANATAL HARMONY) 27-1-260 MG CAPS Take 1 capsule by mouth daily. 30 capsule 11  . cephALEXin (KEFLEX) 500 MG capsule Take 1 capsule (500 mg total) by mouth 4 (four) times daily. (Patient not taking: Reported on 09/20/2015) 20 capsule 0   No current facility-administered medications for this visit.    Review of Systems Review of Systems Constitutional: negative for fatigue and weight loss Respiratory: negative for cough and wheezing Cardiovascular: negative for chest pain, fatigue and palpitations Gastrointestinal: negative for abdominal pain and change in bowel habits Genitourinary:negative Integument/breast: negative  for nipple discharge Musculoskeletal:negative for myalgias Neurological: negative for gait problems and tremors Behavioral/Psych: negative for abusive relationship, depression Endocrine: negative for temperature intolerance     Blood pressure 129/78, pulse 88, weight 240 lb (108.863 kg), last menstrual period 07/21/2015, unknown if currently breastfeeding.  Physical Exam Physical Exam General:   alert  CLINICAL DATA: Lower abdominal and back pain since last night.  EXAM: TRANSVAGINAL OB ULTRASOUND  TECHNIQUE: Transvaginal ultrasound was performed for complete evaluation of the gestation as well as the maternal uterus, adnexal regions, and pelvic cul-de-sac.  COMPARISON: 09/16/2015  FINDINGS: Intrauterine gestational sac: Present.  Yolk sac: Not seen  Embryo: Not seen  MSD: 18.3 mm 6 w 5 d. This gestational sac was seen sonographically 09/01/2015, nearly 3 weeks ago.  Subchorionic hemorrhage: None visualized today.  Maternal uterus/adnexae: Corpus luteum on the left. No extra ovarian adnexal mass. A 4 mm echogenic focus in the right ovary is nonspecific. There is no associated calcific signal or shadowing.  IMPRESSION: Growing intrauterine gestational sac which remains empty. Absence of embryo greater than 2 weeks after a scan that showed gestational sac without yolk sac (09/01/2015) is consistent with nonviable pregnancy per SRU consensus guidelines: Diagnostic Criteria for Nonviable Pregnancy Early in the First Trimester. Macy Mis Engl J Med [phone];369:1443-51.   Electronically Signed  By: Marnee SpringJonathon Watts M.D.  On: 09/21/2015 11:07   100% of 15 min visit spent on counseling and coordination of care.   Data Reviewed  Previous medical hx, meds, labs, ultrasounds  Assessment     Probable blighted ovum pregnancy H/O of multiple miscarriages     Plan   F/U US ordered   F/U quant ordered  Orders Placed This Encounter  Procedures  . Beta HCG,  Quant   No orders of the defined types were placed in this encounter.     Possible management options include: cytotec, D&C Follow up with Dr. Clearance Coots Thursday.

## 2015-09-20 NOTE — MAU Note (Signed)
Urine sent to lab 

## 2015-09-21 ENCOUNTER — Inpatient Hospital Stay (HOSPITAL_COMMUNITY)
Admission: AD | Admit: 2015-09-21 | Discharge: 2015-09-21 | Disposition: A | Discharge status: Home / Self Care | Payer: Medicaid Other | Source: Ambulatory Visit | Attending: Obstetrics | Admitting: Obstetrics

## 2015-09-21 ENCOUNTER — Encounter (HOSPITAL_COMMUNITY): Payer: Self-pay

## 2015-09-21 ENCOUNTER — Inpatient Hospital Stay (HOSPITAL_COMMUNITY): Payer: Medicaid Other

## 2015-09-21 DIAGNOSIS — O161 Unspecified maternal hypertension, first trimester: Secondary | ICD-10-CM | POA: Insufficient documentation

## 2015-09-21 DIAGNOSIS — M549 Dorsalgia, unspecified: Secondary | ICD-10-CM | POA: Diagnosis not present

## 2015-09-21 DIAGNOSIS — Z87891 Personal history of nicotine dependence: Secondary | ICD-10-CM | POA: Diagnosis not present

## 2015-09-21 DIAGNOSIS — O26892 Other specified pregnancy related conditions, second trimester: Secondary | ICD-10-CM | POA: Diagnosis present

## 2015-09-21 DIAGNOSIS — Z808 Family history of malignant neoplasm of other organs or systems: Secondary | ICD-10-CM | POA: Insufficient documentation

## 2015-09-21 DIAGNOSIS — Z3A08 8 weeks gestation of pregnancy: Secondary | ICD-10-CM | POA: Diagnosis not present

## 2015-09-21 DIAGNOSIS — Z87440 Personal history of urinary (tract) infections: Secondary | ICD-10-CM | POA: Insufficient documentation

## 2015-09-21 DIAGNOSIS — O26899 Other specified pregnancy related conditions, unspecified trimester: Secondary | ICD-10-CM

## 2015-09-21 DIAGNOSIS — R51 Headache: Secondary | ICD-10-CM | POA: Insufficient documentation

## 2015-09-21 DIAGNOSIS — Z800 Family history of malignant neoplasm of digestive organs: Secondary | ICD-10-CM | POA: Insufficient documentation

## 2015-09-21 DIAGNOSIS — Z833 Family history of diabetes mellitus: Secondary | ICD-10-CM | POA: Diagnosis not present

## 2015-09-21 DIAGNOSIS — R109 Unspecified abdominal pain: Secondary | ICD-10-CM | POA: Diagnosis not present

## 2015-09-21 DIAGNOSIS — Z8249 Family history of ischemic heart disease and other diseases of the circulatory system: Secondary | ICD-10-CM | POA: Insufficient documentation

## 2015-09-21 LAB — BETA HCG QUANT (REF LAB): HCG QUANT: 18030 m[IU]/mL

## 2015-09-21 NOTE — MAU Provider Note (Signed)
CSN: [id number]     Arrival date & time 09/21/15  4696 History   None    Chief Complaint  Patient presents with  . Abdominal Pain   Abdominal Pain This is a new problem. The current episode started yesterday. The onset quality is sudden. The problem occurs intermittently. The problem has been unchanged. The pain is located in the generalized abdominal region. The quality of the pain is cramping. The abdominal pain radiates to the back. Pertinent negatives include no constipation, diarrhea, dysuria, fever, frequency, headaches, hematochezia, hematuria, melena, nausea or vomiting. The pain is aggravated by movement. The pain is relieved by recumbency. She has tried nothing for the symptoms. Her past medical history is significant for abdominal surgery.   Abdominal Pain [name] is a 25 y.o. [id number] @ [redacted]w[redacted]d gestation who presents to the MAU with abdominal pain. This is a new problem that started yesterday afternoon. The onset quality is sudden. The problem occurs intermittently, lasting for 10 minutes and occurring about every 45 minutes. The pain is located in the generalized abdominal region. The quality of the pain is cramping. The abdominal pain radiates to the mid back. Pertinent negatives include no constipation, diarrhea, fever, frequency, headaches, hematochezia, hematuria, melena, nausea or vomiting. The pain is aggravated by movement. The pain is relieved by recumbency. She has tried nothing for the symptoms. Her past medical history is significant for prior c-section.  Patient had transvaginal ultrasound on 5/25 showing single IU gestational sac with no visualization of yolk sac or embryo and MSD: 4.7 mm; 5 w2d.  U/S was repeated on 6/9 and again showed a single IU gestational sac with no visualization of yolk sac or embryo and MSD: 16 mm; [redacted]w[redacted]d.  Beta hCG has continued to rise (3978 on 5/27 and 32440 on 6/13).  Patient presented to MAU yesterday but left AMA before completion of visit  secondary to it being too busy in office.  Patient reports that she not very confident in her LMP dating but states that her cycles are regular.   Past Medical History  Diagnosis Date  . Hypertension   . Preterm labor   . Urinary tract infection   . NUUVOZDG(644.0)    Past Surgical History  Procedure Laterality Date  . Cesarean section    . Dilation and curettage of uterus    . Therapeutic abortion    . 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    Removal;right   Family History  Problem Relation Age of Onset  . Diabetes Mother   . Diabetes Maternal Grandmother   . Hypertension Maternal Grandmother   . Cancer Maternal Grandmother     colon cancer, brain tumour  . Other Neg Hx    Social History  Substance Use Topics  . Smoking status: Former Smoker -- 0.25 packs/day    Types: Cigarettes  . Smokeless tobacco: Never Used  . Alcohol Use: No   OB History    Gravida Para Term Preterm AB TAB SAB Ectopic Multiple Living   5 1  1 3 1 2  1 2      Review of Systems  Constitutional: Negative for fever and chills.  Respiratory: Negative for shortness of breath.   Cardiovascular: Negative for chest pain and leg swelling.  Gastrointestinal: Positive for abdominal pain. Negative for nausea, vomiting, diarrhea, constipation, blood in stool, melena and hematochezia.  Genitourinary: Negative for dysuria, urgency, frequency, hematuria, vaginal bleeding, vaginal discharge, difficulty urinating and vaginal pain.  Musculoskeletal: Positive for back pain.  Skin: Negative for rash.  Neurological: Negative for dizziness and headaches.     Allergies  Review of patient's allergies indicates no known allergies.  Home Medications   Prior to Admission medications   Medication Sig Start Date End Date Taking? Authorizing Provider  Prenat-FeFmCb-DSS-FA-DHA w/o A (CITRANATAL HARMONY) 27-1-260 MG CAPS Take 1 capsule by mouth daily. 09/06/15  Yes Rachelle A Denney, CNM  cephALEXin (KEFLEX) 500 MG  capsule Take 1 capsule (500 mg total) by mouth 4 (four) times daily. Patient not taking: Reported on 09/20/2015 08/29/15   Duane LopeJennifer I Rasch, NP   BP 111/57 mmHg  Pulse 87  Temp(Src) 98.5 F (36.9 C) (Oral)  Resp 17  Ht 5\' 4"  (1.626 m)  Wt 109.77 kg (242 lb)  BMI 41.52 kg/m2  SpO2 100%  LMP 07/21/2015 (Exact Date)   Physical Exam  Constitutional: She is oriented to person, place, and time. She appears well-developed and well-nourished. No distress.  HENT:  Head: Normocephalic.  Eyes: EOM are normal.  Neck: Neck supple.  Cardiovascular: Normal rate, regular rhythm and normal heart sounds.   Pulmonary/Chest: Effort normal and breath sounds normal.  Abdominal: Soft. Bowel sounds are normal. There is no tenderness. There is no rebound and no guarding.  Unable to reproduce the abdominal cramping on palpation that patient states comes and goes.  Genitourinary:  Not repeated today  Musculoskeletal: She exhibits tenderness (Tenderness on palpation of mid spine).  Neurological: She is alert and oriented to person, place, and time.  Skin: Skin is warm and dry. No rash noted.  Psychiatric: She has a normal mood and affect. Her behavior is normal.    ED Course  Procedures (including critical care time) Labs Review Labs Reviewed - No data to display  Imaging Review [place] Ob Transvaginal  09/21/2015  CLINICAL DATA:  Lower abdominal and back pain since last night. EXAM: TRANSVAGINAL OB ULTRASOUND TECHNIQUE: Transvaginal ultrasound was performed for complete evaluation of the gestation as well as the maternal uterus, adnexal regions, and pelvic cul-de-sac. COMPARISON:  09/16/2015 FINDINGS: Intrauterine gestational sac: Present. Yolk sac:  Not seen Embryo:  Not seen MSD: 18.3 mm 6 w 5 d. This gestational sac was seen sonographically 09/01/2015, nearly 3 weeks ago. Subchorionic hemorrhage:  None visualized today. Maternal uterus/adnexae: Corpus luteum on the left. No extra ovarian adnexal mass. A 4 mm  echogenic focus in the right ovary is nonspecific. There is no associated calcific signal or shadowing. IMPRESSION: Growing intrauterine gestational sac which remains empty. Absence of embryo greater than 2 weeks after a scan that showed gestational sac without yolk sac (09/01/2015) is consistent with nonviable pregnancy per SRU consensus guidelines: Diagnostic Criteria for Nonviable Pregnancy Early in the First Trimester. Macy Mis Engl J Med [phone];369:1443-51. Electronically Signed   By: Marnee SpringJonathon  Watts M.D.   On: 09/21/2015 11:07   I have personally reviewed and evaluated these images as part of my medical decision-making.   MDM  Ultrasound without ectopic or torsion. @ 11:45 am spoke with Dr. Clearance [name] regarding history, exam, and ultrasound results.  He advised to schedule follow up transvaginal u/s in 2 weeks to reevaluate presence of yolk sac. Spoke with patient about possible loss of pregnancy.   Stable for discharge without heavy bleeding or acute abdomen. No concern for torsion or ectopic at this time.  D/C home and advised to return or seek medical care for bleeding or worsening abdominal pain. Patient to keep her her scheduled appointment in the office. Patient voices understanding and agrees with  plan.   Final diagnoses:  Abdominal pain in pregnancy

## 2015-09-21 NOTE — MAU Note (Signed)
Urine in lab 

## 2015-09-21 NOTE — MAU Note (Signed)
Pt c/o lower abdominal pain and cramping since last night. Pt denies bleeding. [facility] had to leave early to pick up her children.

## 2015-09-21 NOTE — Discharge Instructions (Signed)
WE HAVE RESCHEDULED YOUR ULTRASOUND FOR June 30 AT 9:00 AM.   KEEP YOUR APPOINTMENTS WITH DR. HARPER'S OFFICE AS SCHEDULED.   RETURN FOR HEAVY BLEEDING, SEVER PAIN OR OTHER PROBLEMS.

## 2015-09-22 ENCOUNTER — Ambulatory Visit (INDEPENDENT_AMBULATORY_CARE_PROVIDER_SITE_OTHER): Payer: Medicaid Other | Admitting: Obstetrics

## 2015-09-22 ENCOUNTER — Encounter: Payer: Self-pay | Admitting: Obstetrics

## 2015-09-22 ENCOUNTER — Other Ambulatory Visit: Payer: Medicaid Other

## 2015-09-22 VITALS — BP 116/77 | HR 88 | Wt 240.0 lb

## 2015-09-22 DIAGNOSIS — O3680X0 Pregnancy with inconclusive fetal viability, not applicable or unspecified: Secondary | ICD-10-CM

## 2015-09-22 NOTE — Progress Notes (Signed)
Patient ID: [name], female   DOB: 1990-04-11, 25 y.o.   MRN: [id number]  Chief Complaint  Patient presents with  . Follow-up    HPI [name] is a 25 y.o. female.  Presents for F/U for confirmation of viability.  No complaints.  Denies vaginal bleeding or cramping.  HPI  Past Medical History  Diagnosis Date  . Hypertension   . Preterm labor   . Urinary tract infection   . JYNWGNFA(213.0)     Past Surgical History  Procedure Laterality Date  . Cesarean section    . Dilation and curettage of uterus    . Therapeutic abortion    . 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 Diabetes Mother   . Diabetes Maternal Grandmother   . Hypertension Maternal Grandmother   . Cancer Maternal Grandmother     colon cancer, brain tumour  . Other Neg Hx     Social History Social History  Substance Use Topics  . Smoking status: Former Smoker -- 0.25 packs/day    Types: Cigarettes  . Smokeless tobacco: Never Used  . Alcohol Use: No    No Known Allergies  Current Outpatient Prescriptions  Medication Sig Dispense Refill  . cephALEXin (KEFLEX) 500 MG capsule Take 1 capsule (500 mg total) by mouth 4 (four) times daily. (Patient not taking: Reported on 09/20/2015) 20 capsule 0  . Prenat-FeFmCb-DSS-FA-DHA w/o A (CITRANATAL HARMONY) 27-1-260 MG CAPS Take 1 capsule by mouth daily. 30 capsule 11   No current facility-administered medications for this visit.    Review of Systems Review of Systems Constitutional: negative for fatigue and weight loss Respiratory: negative for cough and wheezing Cardiovascular: negative for chest pain, fatigue and palpitations Gastrointestinal: negative for abdominal pain and change in bowel habits Genitourinary:negative Integument/breast: negative for nipple discharge Musculoskeletal:negative for myalgias Neurological: negative for gait problems and tremors Behavioral/Psych:  negative for abusive relationship, depression Endocrine: negative for temperature intolerance     Blood pressure 116/77, pulse 88, weight 240 lb (108.863 kg), last menstrual period 07/21/2015, unknown if currently breastfeeding.  Physical Exam Physical Exam:  Deferred  100% of 10 min visit spent on counseling and coordination of care.   Data Reviewed Ultrasound:  [redacted] week gestational sac.  No yolk sac or embryo.  Essentially the same as U/S ~ 3 weeks ago.  Assessment      Probable failed pregnancy, with no yolk sac or embryo seen 3 weeks after initially seeing a gestational sac on ultrasound.    Plan    Repeat ultrasound in 3 weeks for final confirmation of status of pregnancy.  Orders Placed This Encounter  Procedures  . Beta HCG, Quant   No orders of the defined types were placed in this encounter.

## 2015-09-23 ENCOUNTER — Inpatient Hospital Stay (HOSPITAL_COMMUNITY)
Admission: AD | Admit: 2015-09-23 | Discharge: 2015-09-23 | Discharge status: Against Medical Advice | Payer: Medicaid Other | Source: Ambulatory Visit | Attending: Obstetrics | Admitting: Obstetrics

## 2015-09-23 ENCOUNTER — Ambulatory Visit (HOSPITAL_COMMUNITY): Payer: Medicaid Other

## 2015-09-23 DIAGNOSIS — M549 Dorsalgia, unspecified: Secondary | ICD-10-CM | POA: Insufficient documentation

## 2015-09-23 DIAGNOSIS — R1030 Lower abdominal pain, unspecified: Secondary | ICD-10-CM | POA: Diagnosis not present

## 2015-09-23 LAB — URINALYSIS, ROUTINE W REFLEX MICROSCOPIC
Bilirubin Urine: NEGATIVE
GLUCOSE, UA: NEGATIVE mg/dL
Hgb urine dipstick: NEGATIVE
KETONES UR: 15 mg/dL — AB
LEUKOCYTES UA: NEGATIVE
NITRITE: POSITIVE — AB
PROTEIN: NEGATIVE mg/dL
Specific Gravity, Urine: >1.03 — ABNORMAL HIGH (ref 1.005–1.030)
pH: 6 (ref 5.0–8.0)

## 2015-09-23 LAB — URINE MICROSCOPIC-ADD ON

## 2015-09-23 LAB — BETA HCG QUANT (REF LAB): HCG QUANT: 19921 m[IU]/mL

## 2015-09-23 NOTE — MAU Note (Signed)
Middle of my back hurts. I am confused. Had u/s 2 days ago. Saw sac and no baby. Having lower abd cramps. No bleeding

## 2015-09-23 NOTE — MAU Note (Signed)
Admission personnel came back and stated pt left with another pt who was leaving as they came together.

## 2015-09-25 ENCOUNTER — Inpatient Hospital Stay (HOSPITAL_COMMUNITY)
Admission: AD | Admit: 2015-09-25 | Discharge: 2015-09-25 | Disposition: A | Discharge status: Home / Self Care | Payer: Medicaid Other | Source: Ambulatory Visit | Attending: Obstetrics | Admitting: Obstetrics

## 2015-09-25 ENCOUNTER — Encounter (HOSPITAL_COMMUNITY): Payer: Self-pay

## 2015-09-25 DIAGNOSIS — Z87891 Personal history of nicotine dependence: Secondary | ICD-10-CM | POA: Diagnosis not present

## 2015-09-25 DIAGNOSIS — Z3A09 9 weeks gestation of pregnancy: Secondary | ICD-10-CM | POA: Diagnosis not present

## 2015-09-25 DIAGNOSIS — O26891 Other specified pregnancy related conditions, first trimester: Secondary | ICD-10-CM | POA: Diagnosis present

## 2015-09-25 DIAGNOSIS — O2341 Unspecified infection of urinary tract in pregnancy, first trimester: Secondary | ICD-10-CM | POA: Diagnosis not present

## 2015-09-25 DIAGNOSIS — M545 Low back pain: Secondary | ICD-10-CM | POA: Diagnosis present

## 2015-09-25 LAB — CBC
HCT: 35.5 % — ABNORMAL LOW (ref 36.0–46.0)
Hemoglobin: 11.8 g/dL — ABNORMAL LOW (ref 12.0–15.0)
MCH: 28.9 pg (ref 26.0–34.0)
MCHC: 33.2 g/dL (ref 30.0–36.0)
MCV: 87 fL (ref 78.0–100.0)
PLATELETS: 311 10*3/uL (ref 150–400)
RBC: 4.08 MIL/uL (ref 3.87–5.11)
RDW: 12.9 % (ref 11.5–15.5)
WBC: 7 10*3/uL (ref 4.0–10.5)

## 2015-09-25 LAB — URINALYSIS, ROUTINE W REFLEX MICROSCOPIC
BILIRUBIN URINE: NEGATIVE
Glucose, UA: NEGATIVE mg/dL
HGB URINE DIPSTICK: NEGATIVE
KETONES UR: NEGATIVE mg/dL
Leukocytes, UA: NEGATIVE
Nitrite: POSITIVE — AB
PROTEIN: NEGATIVE mg/dL
Specific Gravity, Urine: 1.015 (ref 1.005–1.030)
pH: 6.5 (ref 5.0–8.0)

## 2015-09-25 LAB — URINE MICROSCOPIC-ADD ON

## 2015-09-25 LAB — HCG, QUANTITATIVE, PREGNANCY: HCG, BETA CHAIN, QUANT, S: 27897 m[IU]/mL — AB (ref <5)

## 2015-09-25 MED ORDER — NITROFURANTOIN MONOHYD MACRO 100 MG PO CAPS
100.0000 mg | ORAL_CAPSULE | Freq: Once | ORAL | Status: AC
  Administered 2015-09-25: 100 mg via ORAL
  Filled 2015-09-25: qty 1

## 2015-09-25 MED ORDER — ACETAMINOPHEN 325 MG PO TABS
650.0000 mg | ORAL_TABLET | Freq: Once | ORAL | Status: AC
  Administered 2015-09-25: 650 mg via ORAL
  Filled 2015-09-25: qty 2

## 2015-09-25 MED ORDER — NITROFURANTOIN MONOHYD MACRO 100 MG PO CAPS: 100.0000 mg | ORAL_CAPSULE | Freq: Two times a day (BID) | ORAL | Status: DC

## 2015-09-25 NOTE — Discharge Instructions (Signed)

## 2015-09-25 NOTE — MAU Note (Signed)
Pt c/o lower abdominal pain/mid back (flank pain).  Pt also states her sides hurt.  Pt denies pain or burning with urination.  Pt denies any vaginal bleeding.

## 2015-09-25 NOTE — MAU Note (Signed)
Pt C/O lower back & lower abd pain for "quite a while."  Denies vaginal bleeding, had BM today & noted bleeding in BM.

## 2015-09-25 NOTE — MAU Provider Note (Signed)
History     CSN: [id number]  Arrival date and time: 09/25/15 4098   First Provider Initiated Contact with Patient 09/25/15 2037      Chief Complaint  Patient presents with  . Abdominal Pain  . Back Pain   HPI Pt is [redacted]w[redacted]d [id number] who presents with intense mid/low  back pain.  Pt has been followed for concern for failed pregnancy. Pt denies vaginal spotting or bleeding. Pt denies any UTI sx. Pt had [place] on 5/25 with IUGS with no YS or embryo measuring [redacted]w[redacted]d  On 09/21/2015 pt had another ultrasound with IUGS, showing slightly larger IUGS without yolk sac meeting diagnostic criteria for nonviable pregnancy.  Pt has seen Dr. Clearance Coots who has recommended pt have another ultrasound in 3 weeks. HCG 3978 on 5/27 and 29562 on 6/13.   Past Medical History  Diagnosis Date  . Hypertension   . Preterm labor   . Urinary tract infection   . ZHYQMVHQ(469.6)     Past Surgical History  Procedure Laterality Date  . Cesarean section    . Dilation and curettage of uterus    . Therapeutic abortion    . 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 Diabetes Mother   . Diabetes Maternal Grandmother   . Hypertension Maternal Grandmother   . Cancer Maternal Grandmother     colon cancer, brain tumour  . Other Neg Hx     Social History  Substance Use Topics  . Smoking status: Former Smoker -- 0.25 packs/day    Types: Cigarettes  . Smokeless tobacco: Never Used  . Alcohol Use: No    Allergies: No Known Allergies  Prescriptions prior to admission  Medication Sig Dispense Refill Last Dose  . Prenat-FeFmCb-DSS-FA-DHA w/o A (CITRANATAL HARMONY) 27-1-260 MG CAPS Take 1 capsule by mouth daily. 30 capsule 11 09/25/2015 at Unknown time  . cephALEXin (KEFLEX) 500 MG capsule Take 1 capsule (500 mg total) by mouth 4 (four) times daily. (Patient not taking: Reported on 09/20/2015) 20 capsule 0 Not Taking at Unknown time    Review of Systems   Constitutional: Negative for fever and chills.  Gastrointestinal: Positive for abdominal pain. Negative for nausea, vomiting, diarrhea and constipation.  Genitourinary: Negative for dysuria and urgency.  Musculoskeletal: Negative for back pain.  Neurological: Negative for headaches.   Physical Exam   Blood pressure 117/74, pulse 89, temperature 98.7 F (37.1 C), temperature source Oral, resp. rate 20, last menstrual period 07/21/2015, unknown if currently breastfeeding.  Physical Exam  Nursing note and vitals reviewed. Constitutional: She is oriented to person, place, and time. She appears well-developed and well-nourished. No distress.  HENT:  Head: Normocephalic.  Eyes: Pupils are equal, round, and reactive to light.  Neck: Normal range of motion. Neck supple.  Cardiovascular: Normal rate.   Respiratory: Effort normal.  No CVA tenderness  GI: Soft. She exhibits no distension. There is no tenderness.  Musculoskeletal: Normal range of motion.  Neurological: She is alert and oriented to person, place, and time.  Skin: Skin is warm and dry.  Psychiatric: She has a normal mood and affect.    MAU Course  Procedures Results for orders placed or performed during the hospital encounter of 09/25/15 (from the past 24 hour(s))  Urinalysis, Routine w reflex microscopic (not at [facility])     Status: Abnormal   Collection Time: 09/25/15  5:35 PM  Result Value Ref Range   Color,  Urine YELLOW YELLOW   APPearance CLEAR CLEAR   Specific Gravity, Urine 1.015 1.005 - 1.030   pH 6.5 5.0 - 8.0   Glucose, UA NEGATIVE NEGATIVE mg/dL   Hgb urine dipstick NEGATIVE NEGATIVE   Bilirubin Urine NEGATIVE NEGATIVE   Ketones, ur NEGATIVE NEGATIVE mg/dL   Protein, ur NEGATIVE NEGATIVE mg/dL   Nitrite POSITIVE (A) NEGATIVE   Leukocytes, UA NEGATIVE NEGATIVE  Urine microscopic-add on     Status: Abnormal   Collection Time: 09/25/15  5:35 PM  Result Value Ref Range   Squamous Epithelial / LPF 0-5 (A) NONE  SEEN   WBC, UA 0-5 0 - 5 WBC/hpf   RBC / HPF 0-5 0 - 5 RBC/hpf   Bacteria, UA MANY (A) NONE SEEN  CBC     Status: Abnormal   Collection Time: 09/25/15  7:25 PM  Result Value Ref Range   WBC 7.0 4.0 - 10.5 K/uL   RBC 4.08 3.87 - 5.11 MIL/uL   Hemoglobin 11.8 (L) 12.0 - 15.0 g/dL   HCT 40.935.5 (L) 81.136.0 - 91.446.0 %   MCV 87.0 78.0 - 100.0 fL   MCH 28.9 26.0 - 34.0 pg   MCHC 33.2 30.0 - 36.0 g/dL   RDW 78.212.9 95.611.5 - 21.315.5 %   Platelets 311 150 - 400 K/uL  urine culture pending Tylenol and Macrobid given in MAU Results for [name], [name] (MRN [id number]) as of 09/25/2015 21:13  Ref. Range 09/06/2015 14:49 09/20/2015 15:18 09/20/2015 20:45 09/22/2015 14:58 09/25/2015 19:25  hCG Quant  Latest Units: mIU/mL 4857 18030 21417 [id number] 27897   Results for orders placed or performed during the hospital encounter of 09/25/15 (from the past 24 hour(s))  Urinalysis, Routine w reflex microscopic (not at [facility])     Status: Abnormal   Collection Time: 09/25/15  5:35 PM  Result Value Ref Range   Color, Urine YELLOW YELLOW   APPearance CLEAR CLEAR   Specific Gravity, Urine 1.015 1.005 - 1.030   pH 6.5 5.0 - 8.0   Glucose, UA NEGATIVE NEGATIVE mg/dL   Hgb urine dipstick NEGATIVE NEGATIVE   Bilirubin Urine NEGATIVE NEGATIVE   Ketones, ur NEGATIVE NEGATIVE mg/dL   Protein, ur NEGATIVE NEGATIVE mg/dL   Nitrite POSITIVE (A) NEGATIVE   Leukocytes, UA NEGATIVE NEGATIVE  Urine microscopic-add on     Status: Abnormal   Collection Time: 09/25/15  5:35 PM  Result Value Ref Range   Squamous Epithelial / LPF 0-5 (A) NONE SEEN   WBC, UA 0-5 0 - 5 WBC/hpf   RBC / HPF 0-5 0 - 5 RBC/hpf   Bacteria, UA MANY (A) NONE SEEN  hCG, quantitative, pregnancy     Status: Abnormal   Collection Time: 09/25/15  7:25 PM  Result Value Ref Range   hCG, Beta Chain, Quant, S 27897 (H) <5 mIU/mL  CBC     Status: Abnormal   Collection Time: 09/25/15  7:25 PM  Result Value Ref Range   WBC 7.0 4.0 - 10.5 K/uL   RBC 4.08 3.87 - 5.11 MIL/uL    Hemoglobin 11.8 (L) 12.0 - 15.0 g/dL   HCT 84.135.5 (L) 32.436.0 - 40.146.0 %   MCV 87.0 78.0 - 100.0 fL   MCH 28.9 26.0 - 34.0 pg   MCHC 33.2 30.0 - 36.0 g/dL   RDW 02.712.9 25.311.5 - 66.415.5 %   Platelets 311 150 - 400 K/uL   Assessment and Plan  Back pain in pregnancy Abnormal HCG and US meeting criteria for failed  pregnancy- will f/u with Dr. Clearance Coots UTI in pregnancy- urine culture pending Rx MAcrobid BID for 7 days Follow up with Dr. Rubin Payor 09/25/2015, 8:51 PM

## 2015-09-26 ENCOUNTER — Other Ambulatory Visit: Payer: Self-pay | Admitting: Obstetrics

## 2015-09-27 ENCOUNTER — Encounter (HOSPITAL_COMMUNITY): Payer: Self-pay | Admitting: *Deleted

## 2015-09-27 ENCOUNTER — Inpatient Hospital Stay (EMERGENCY_DEPARTMENT_HOSPITAL)
Admission: AD | Admit: 2015-09-27 | Discharge: 2015-09-28 | Disposition: A | Discharge status: Home / Self Care | Payer: Medicaid Other | Source: Ambulatory Visit | Attending: Obstetrics | Admitting: Obstetrics

## 2015-09-27 ENCOUNTER — Inpatient Hospital Stay (HOSPITAL_COMMUNITY): Payer: Medicaid Other

## 2015-09-27 ENCOUNTER — Inpatient Hospital Stay (HOSPITAL_COMMUNITY)
Admission: AD | Admit: 2015-09-27 | Discharge: 2015-09-27 | Disposition: A | Discharge status: Home / Self Care | Payer: Medicaid Other | Source: Ambulatory Visit | Attending: Obstetrics | Admitting: Obstetrics

## 2015-09-27 ENCOUNTER — Other Ambulatory Visit: Payer: Self-pay | Admitting: Obstetrics

## 2015-09-27 DIAGNOSIS — Z87891 Personal history of nicotine dependence: Secondary | ICD-10-CM | POA: Insufficient documentation

## 2015-09-27 DIAGNOSIS — R1030 Lower abdominal pain, unspecified: Secondary | ICD-10-CM

## 2015-09-27 DIAGNOSIS — R42 Dizziness and giddiness: Secondary | ICD-10-CM

## 2015-09-27 DIAGNOSIS — O161 Unspecified maternal hypertension, first trimester: Secondary | ICD-10-CM

## 2015-09-27 DIAGNOSIS — O200 Threatened abortion: Secondary | ICD-10-CM | POA: Diagnosis not present

## 2015-09-27 DIAGNOSIS — O26891 Other specified pregnancy related conditions, first trimester: Secondary | ICD-10-CM | POA: Insufficient documentation

## 2015-09-27 DIAGNOSIS — O020 Blighted ovum and nonhydatidiform mole: Secondary | ICD-10-CM

## 2015-09-27 DIAGNOSIS — Z3A09 9 weeks gestation of pregnancy: Secondary | ICD-10-CM | POA: Insufficient documentation

## 2015-09-27 DIAGNOSIS — O10911 Unspecified pre-existing hypertension complicating pregnancy, first trimester: Secondary | ICD-10-CM | POA: Insufficient documentation

## 2015-09-27 DIAGNOSIS — O209 Hemorrhage in early pregnancy, unspecified: Secondary | ICD-10-CM

## 2015-09-27 DIAGNOSIS — N939 Abnormal uterine and vaginal bleeding, unspecified: Secondary | ICD-10-CM | POA: Diagnosis present

## 2015-09-27 LAB — URINALYSIS, ROUTINE W REFLEX MICROSCOPIC
GLUCOSE, UA: NEGATIVE mg/dL
KETONES UR: 15 mg/dL — AB
NITRITE: NEGATIVE
PH: 6 (ref 5.0–8.0)
PROTEIN: 30 mg/dL — AB
Specific Gravity, Urine: 1.02 (ref 1.005–1.030)

## 2015-09-27 LAB — CBC
HEMATOCRIT: 35.4 % — AB (ref 36.0–46.0)
HEMOGLOBIN: 11.9 g/dL — AB (ref 12.0–15.0)
MCH: 29 pg (ref 26.0–34.0)
MCHC: 33.6 g/dL (ref 30.0–36.0)
MCV: 86.3 fL (ref 78.0–100.0)
Platelets: 286 10*3/uL (ref 150–400)
RBC: 4.1 MIL/uL (ref 3.87–5.11)
RDW: 13 % (ref 11.5–15.5)
WBC: 8.5 10*3/uL (ref 4.0–10.5)

## 2015-09-27 LAB — URINE MICROSCOPIC-ADD ON

## 2015-09-27 LAB — HCG, QUANTITATIVE, PREGNANCY: HCG, BETA CHAIN, QUANT, S: 28045 m[IU]/mL — AB (ref <5)

## 2015-09-27 MED ORDER — HYDROMORPHONE HCL 1 MG/ML IJ SOLN
1.0000 mg | Freq: Once | INTRAMUSCULAR | Status: AC
  Administered 2015-09-27: 1 mg via INTRAMUSCULAR
  Filled 2015-09-27: qty 1

## 2015-09-27 MED ORDER — LACTATED RINGERS IV BOLUS (SEPSIS)
1000.0000 mL | Freq: Once | INTRAVENOUS | Status: AC
  Administered 2015-09-27: 1000 mL via INTRAVENOUS

## 2015-09-27 NOTE — MAU Note (Signed)
Pt states that she was here earlier today. Having some cramping and vag bleeding. Thinks she is changing pad every hour since 5-6pm. States that she passed a clot this morning but none tonight. Has not taken anything for pain.

## 2015-09-27 NOTE — MAU Provider Note (Signed)
CSN: [id number]     Arrival date & time 09/27/15  1105 History   None    Chief Complaint  Patient presents with  . Vaginal Bleeding  . Abdominal Cramping     (Consider location/radiation/quality/duration/timing/severity/associated sxs/prior Treatment) HPI  [name] is a 25 y.o. [id number] @ 7273w5d gestation who presents to the MAU with vaginal bleeding. The bleeding started this morning. Patient is being followed for early pregnancy that is possible failed pregnancy. Bhcg has continued to rise and Dr. Clearance [name] has had patient f/u for Bhcgs and u/s. Patient denies pain only bleeding.   Past Medical History  Diagnosis Date  . Hypertension   . Preterm labor   . Urinary tract infection   . QMVHQION(629.5Headache(784.0)    Past Surgical History  Procedure Laterality Date  . Cesarean section    . Dilation and curettage of uterus    . Therapeutic abortion    . 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    Removal;right   Family History  Problem Relation Age of Onset  . Diabetes Mother   . Diabetes Maternal Grandmother   . Hypertension Maternal Grandmother   . Cancer Maternal Grandmother     colon cancer, brain tumour  . Other Neg Hx    Social History  Substance Use Topics  . Smoking status: Former Smoker -- 0.25 packs/day    Types: Cigarettes  . Smokeless tobacco: Never Used  . Alcohol Use: No   OB History    Gravida Para Term Preterm AB TAB SAB Ectopic Multiple Living   5 1  1 3 1 2  1 2      Review of Systems Negative except as stated in HPI   Allergies  Review of patient's allergies indicates no known allergies.  Home Medications   Prior to Admission medications   Medication Sig Start Date End Date Taking? Authorizing Provider  nitrofurantoin, macrocrystal-monohydrate, (MACROBID) 100 MG capsule Take 1 capsule (100 mg total) by mouth 2 (two) times daily. 09/25/15  Yes Jean RosenthalSusan P Lineberry, NP  Prenat-FeFmCb-DSS-FA-DHA w/o A (CITRANATAL HARMONY) 27-1-260 MG CAPS Take 1 capsule by mouth  daily. 09/06/15  Yes Rachelle A Denney, CNM   BP 132/83 mmHg  Pulse 72  Temp(Src) 98.2 F (36.8 C) (Oral)  Resp 18  SpO2 100%  LMP 07/21/2015 (Exact Date) Physical Exam  Constitutional: She is oriented to person, place, and time. She appears well-developed and well-nourished.  HENT:  Head: Normocephalic.  Eyes: EOM are normal.  Neck: Neck supple.  Cardiovascular: Normal rate.   Pulmonary/Chest: Effort normal.  Abdominal: Soft. There is no tenderness.  Musculoskeletal: Normal range of motion.  Neurological: She is alert and oriented to person, place, and time. No cranial nerve deficit.  Skin: Skin is warm and dry.  Psychiatric: She has a normal mood and affect. Her behavior is normal.  Nursing note and vitals reviewed.   ED Course  Procedures (including critical care time) Labs Review Labs Reviewed  URINALYSIS, ROUTINE W REFLEX MICROSCOPIC (NOT AT [facility]) - Abnormal; Notable for the following:    APPearance HAZY (*)    Hgb urine dipstick LARGE (*)    Bilirubin Urine SMALL (*)    Ketones, ur 15 (*)    Protein, ur 30 (*)    Leukocytes, UA TRACE (*)    All other components within normal limits  URINE MICROSCOPIC-ADD ON - Abnormal; Notable for the following:    Squamous Epithelial / LPF 0-5 (*)    Bacteria, UA FEW (*)  All other components within normal limits  HCG, QUANTITATIVE, PREGNANCY - Abnormal; Notable for the following:    hCG, Beta Chain, [place] Longest [address] (*)    All other components within normal limits   US Ob Transvaginal  09/27/2015  CLINICAL DATA:  Vaginal bleeding. Failed pregnancy demonstrated 09/21/2015 EXAM: OBSTETRIC <14 WK [place] AND TRANSVAGINAL OB US TECHNIQUE: Both transabdominal and transvaginal ultrasound examinations were performed for complete evaluation of the gestation as well as the maternal uterus, adnexal regions, and pelvic cul-de-sac. Transvaginal technique was performed to assess early pregnancy. COMPARISON:  Ultrasound 09/21/2015 FINDINGS:  Intrauterine gestational sac: Present Yolk sac:  Absent Embryo:  Absent MSD: 16.3 mm mm 6 w 3 d (previously mean sac diameter equals 18.3 mm on 09/21/2015) Subchorionic hemorrhage:  None visualized. Maternal uterus/adnexae: Corpus luteal cyst LEFT ovary. IMPRESSION: Minimal interval change in 1 week time. Gestational sac without embryo or yolk sac. Findings remain consistent with nonviable pregnancy. Electronically Signed   By: Genevive Bi M.D.   On: 09/27/2015 14:21     MDM  25 y.o. female with vaginal bleeding in first trimester pregnancy, minimal change on ultrasound since previous u/s 1 week ago but rising Bhcg. Patient stable for d/c and agrees with expectant management. Discussed with DR. Clearance Coots and if patient does not miscarry he would like her to f/u in 3 weeks for repeat u/s.  Discussed with the patient and all questioned fully answered. She will return if any problems arise. Discussed s/s that would indicate need for return.   Final diagnoses:  Threatened miscarriage

## 2015-09-27 NOTE — MAU Note (Signed)
C/o vaginal bleeding since yesterday at 0800; bleeding is scant now; c/o intermittent light cramping yesterday;

## 2015-09-27 NOTE — Discharge Instructions (Signed)

## 2015-09-27 NOTE — MAU Provider Note (Signed)
History     CSN: [id number]  Arrival date and time: 09/27/15 2048   First Provider Initiated Contact with Patient 09/27/15 2212      Chief Complaint  Patient presents with  . Vaginal Bleeding   HPI [name] is a 25 y.o. [id number] at [redacted]w[redacted]d who presents to MAU today with complaint of vaginal bleeding and abdominal pain. The patient was seen in MAU earlier today with vaginal bleeding and confirmed that she has a blighted ovum. The patient was told to follow-up with Dr. Clearance Coots in 2-3 weeks as planned to confirm miscarriage. She states bleeding has increased since last visit in MAU a few hours ago and pain is worse. She states pain was severe upon arrival, but after 1 mg Dilaudid in MAU she rates pain at 6/10 and appears very comfortable. She states that she has used 4 pads since she left MAU. She also endorses dizziness.   OB History    Gravida Para Term Preterm AB TAB SAB Ectopic Multiple Living   Past Medical History  Diagnosis Date  . Hypertension   . Preterm labor   . Urinary tract infection   . ZHYQMVHQ(469.6)     Past Surgical History  Procedure Laterality Date  . Cesarean section    . Dilation and curettage of uterus    . Therapeutic abortion    . 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 Diabetes Mother   . Diabetes Maternal Grandmother   . Hypertension Maternal Grandmother   . Cancer Maternal Grandmother     colon cancer, brain tumour  . Other Neg Hx     Social History  Substance Use Topics  . Smoking status: Former Smoker -- 0.25 packs/day    Types: Cigarettes  . Smokeless tobacco: Never Used  . Alcohol Use: No    Allergies: No Known Allergies  Prescriptions prior to admission  Medication Sig Dispense Refill Last Dose  . nitrofurantoin, macrocrystal-monohydrate, (MACROBID) 100 MG capsule Take 1 capsule (100 mg total) by mouth 2 (two) times daily. 14 capsule  0 09/26/2015 at Unknown time  . Prenat-FeFmCb-DSS-FA-DHA w/o A (CITRANATAL HARMONY) 27-1-260 MG CAPS Take 1 capsule by mouth daily. 30 capsule 11 09/26/2015 at Unknown time    Review of Systems  Constitutional: Negative for fever and malaise/fatigue.  Gastrointestinal: Positive for abdominal pain. Negative for nausea, vomiting, diarrhea and constipation.  Genitourinary:       + vaginal bleeding  Neurological: Positive for dizziness. Negative for loss of consciousness.   Physical Exam   Blood pressure 110/75, pulse 102, temperature 99 F (37.2 C), temperature source Oral, resp. rate 18, last menstrual period 07/21/2015, SpO2 100 %, unknown if currently breastfeeding.  Physical Exam  Nursing note and vitals reviewed. Constitutional: She is oriented to person, place, and time. She appears well-developed and well-nourished. No distress.  HENT:  Head: Normocephalic and atraumatic.  Cardiovascular: Normal rate.   Respiratory: Effort normal.  GI: Soft. She exhibits no distension and no mass. There is tenderness (mild midline suprapubic tenderness to palpation). There is no rebound and no guarding.  Genitourinary: Cervix exhibits no motion tenderness, no discharge and no friability. There is bleeding (small light pink blood in the vaginal vault) in the vagina. No vaginal discharge found.  Neurological: She is alert and oriented to person, place, and time.  Skin: Skin is warm and dry. No erythema.  Psychiatric: She has a normal mood and affect.  Dilation: Closed Effacement (%): Thick Cervical Position: Posterior Exam by:: Magnus SinningWenzel, PA   Results for orders placed or performed during the hospital encounter of 09/27/15 (from the past 24 hour(s))  CBC     Status: Abnormal   Collection Time: 09/27/15  9:22 PM  Result Value Ref Range   WBC 8.5 4.0 - 10.5 K/uL   RBC 4.10 3.87 - 5.11 MIL/uL   Hemoglobin 11.9 (L) 12.0 - 15.0 g/dL   HCT 21.335.4 (L) 08.636.0 - 57.846.0 %   MCV 86.3 78.0 - 100.0 fL   MCH 29.0  26.0 - 34.0 pg   MCHC 33.6 30.0 - 36.0 g/dL   RDW 46.913.0 62.911.5 - 52.815.5 %   Platelets 286 150 - 400 K/uL      MAU Course  Procedures None  MDM Patient writhing in pain upon arrival. 1 mg Dilaudid given IM.  CBC today stable. Minimal bleeding on exam. Orthostatic VS without orthostatic changes. Dizziness most likely from Dilaudid.  IV LR bolus - patient reports some improvement in dizziness.  Patient is hemodynamically stable for discharge at this time.  Assessment and Plan  A: Blighted ovum Vaginal bleeding  Abdominal pain  Dizziness   P: Discharge home Rx for Tylenol #3 given to patient  Bleeding precautions discussed Patient advised to follow-up with Dr. Clearance [name] as planned in 2-3 weeks  Patient may return to MAU as needed or if her condition were to change or worsen   Marny LowensteinJulie N Wenzel, PA-C  09/28/2015, 12:14 AM

## 2015-09-28 ENCOUNTER — Other Ambulatory Visit: Payer: Self-pay | Admitting: Obstetrics

## 2015-09-28 DIAGNOSIS — O020 Blighted ovum and nonhydatidiform mole: Secondary | ICD-10-CM | POA: Diagnosis not present

## 2015-09-28 LAB — URINE CULTURE

## 2015-09-28 MED ORDER — ACETAMINOPHEN-CODEINE #3 300-30 MG PO TABS: 1.0000 | ORAL_TABLET | Freq: Four times a day (QID) | ORAL | Status: DC | PRN

## 2015-09-28 NOTE — Discharge Instructions (Signed)
°  Blighted Ovum °A blighted ovum is a common kind of early pregnancy failure. It happens when a fertilized egg attaches to the uterus but stops growing. Even though the egg never develops, the body acts like it is pregnant. A sac starts to form around the egg, and tissue to support a baby starts to form in the placenta. °CAUSES °This condition is usually caused by a genetic defect in the egg. °SYMPTOMS °Early symptoms of this condition are the same as those of early pregnancy. They include: °· A missed menstrual period. °· Fatigue. °· Feeling sick to your stomach (nauseous). °· Sore breasts. °Later symptoms are those of pregnancy loss. They include: °· Abdominal cramps. °· Vaginal bleeding or spotting. °· A menstrual period that is heavier than usual. °DIAGNOSIS °This condition is usually diagnosed during a routine ultrasound. It can be confirmed with blood tests. °TREATMENT °This condition may be treated by: °· Waiting until your body naturally gets rid of the empty egg sac and placenta (miscarriage). °· Taking medicine to start a miscarriage. This medicine can be taken by mouth or placed into the vagina. °· Having a surgical procedure to remove the tissue. Your health care provider would open the entrance to your womb (dilation) and remove the tissue (curettage). °HOME CARE INSTRUCTIONS °· Take over-the-counter and prescription medicines only as told by your health care provider. °· Talk to your health care provider about when you can try to get pregnant again. Having this condition does not mean you will lose future pregnancies. °· After your miscarriage: °¨ Rest at home for a few days. °¨ You may bleed heavily for a week or more, and you may have light bleeding for a couple weeks after that. Wear a pad until vaginal bleeding stops. °SEEK MEDICAL CARE IF: °· You have a fever or chills. °· Your pain medicine is not helping. °· You have vaginal bleeding that continues for longer than expected. °SEEK IMMEDIATE  MEDICAL CARE IF: °· You have severe abdominal pain. °· You feel dizzy or faint. °· You pass out. °· You have very heavy vaginal bleeding. A sign that vaginal bleeding is very heavy is if blood soaks through two large sanitary pads an hour for more than two hours. °  °This information is not intended to replace advice given to you by your health care provider. Make sure you discuss any questions you have with your health care provider. °  °Document Released: 07/11/2010 Document Revised: 12/15/2014 Document Reviewed: 08/11/2014 °Elsevier Interactive Patient Education ©2016 [employer] ° °

## 2015-09-30 ENCOUNTER — Encounter (HOSPITAL_COMMUNITY): Payer: Self-pay | Admitting: *Deleted

## 2015-09-30 ENCOUNTER — Inpatient Hospital Stay (HOSPITAL_COMMUNITY)
Admission: AD | Admit: 2015-09-30 | Discharge: 2015-09-30 | Disposition: A | Discharge status: Home / Self Care | Payer: Medicaid Other | Source: Ambulatory Visit | Attending: Obstetrics | Admitting: Obstetrics

## 2015-09-30 ENCOUNTER — Encounter: Payer: Medicaid Other | Admitting: Certified Nurse Midwife

## 2015-09-30 DIAGNOSIS — O039 Complete or unspecified spontaneous abortion without complication: Secondary | ICD-10-CM | POA: Diagnosis not present

## 2015-09-30 DIAGNOSIS — O4690 Antepartum hemorrhage, unspecified, unspecified trimester: Secondary | ICD-10-CM | POA: Diagnosis present

## 2015-09-30 DIAGNOSIS — Z87891 Personal history of nicotine dependence: Secondary | ICD-10-CM | POA: Diagnosis not present

## 2015-09-30 LAB — CBC
HEMATOCRIT: 33.1 % — AB (ref 36.0–46.0)
Hemoglobin: 11.3 g/dL — ABNORMAL LOW (ref 12.0–15.0)
MCH: 29.1 pg (ref 26.0–34.0)
MCHC: 34.1 g/dL (ref 30.0–36.0)
MCV: 85.3 fL (ref 78.0–100.0)
PLATELETS: 288 10*3/uL (ref 150–400)
RBC: 3.88 MIL/uL (ref 3.87–5.11)
RDW: 13 % (ref 11.5–15.5)
WBC: 8 10*3/uL (ref 4.0–10.5)

## 2015-09-30 LAB — HCG, QUANTITATIVE, PREGNANCY: HCG, BETA CHAIN, QUANT, S: 6062 m[IU]/mL — AB (ref <5)

## 2015-09-30 NOTE — MAU Note (Signed)
PT  HAS ARRIVED    SAYING  VAG BLEEDING  HAS BECOME WORSE-   WITH CLOTS.  IN RM 10-   RED  SMEARS ON PERINEUM     WAS HERE ON Tuesday-   FOR  SAME.

## 2015-09-30 NOTE — MAU Provider Note (Signed)
History     CSN: [id number]  Arrival date and time: 09/30/15 [id number]   First Provider Initiated Contact with Patient 09/30/15 [phone]      Chief Complaint  Patient presents with  . Vaginal Bleeding   Vaginal Bleeding The patient's primary symptoms include vaginal bleeding. This is a new problem. The current episode started yesterday. The problem occurs intermittently. The problem has been gradually worsening. Pain severity now: 7/10  She is pregnant. Associated symptoms include abdominal pain. Pertinent negatives include no chills, constipation, diarrhea, dysuria, fever, frequency, nausea, urgency or vomiting. The vaginal discharge was bloody. The vaginal bleeding is heavier than menses. She has been passing clots. Passing tissue: unsure  Nothing aggravates the symptoms. She has tried nothing for the symptoms.    Past Medical History  Diagnosis Date  . Hypertension   . Preterm labor   . Urinary tract infection   . NGEXBMWU(132.4Headache(784.0)     Past Surgical History  Procedure Laterality Date  . Cesarean section    . Dilation and curettage of uterus    . Therapeutic abortion    . 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 Diabetes Mother   . Diabetes Maternal Grandmother   . Hypertension Maternal Grandmother   . Cancer Maternal Grandmother     colon cancer, brain tumour  . Other Neg Hx     Social History  Substance Use Topics  . Smoking status: Former Smoker -- 0.25 packs/day    Types: Cigarettes  . Smokeless tobacco: Never Used  . Alcohol Use: No    Allergies: No Known Allergies  Prescriptions prior to admission  Medication Sig Dispense Refill Last Dose  . acetaminophen-codeine (TYLENOL #3) 300-30 MG tablet Take 1-2 tablets by mouth every 6 (six) hours as needed for moderate pain. 15 tablet 0 09/29/2015 at 1500  . nitrofurantoin, macrocrystal-monohydrate, (MACROBID) 100 MG capsule Take 1 capsule (100 mg total) by  mouth 2 (two) times daily. 14 capsule 0 Past Week at Unknown time  . Prenat-FeFmCb-DSS-FA-DHA w/o A (CITRANATAL HARMONY) 27-1-260 MG CAPS Take 1 capsule by mouth daily. 30 capsule 11 09/26/2015 at Unknown time    Review of Systems  Constitutional: Negative for fever and chills.  Gastrointestinal: Positive for abdominal pain. Negative for nausea, vomiting, diarrhea and constipation.  Genitourinary: Positive for vaginal bleeding. Negative for dysuria, urgency and frequency.   Physical Exam   Blood pressure 115/68, pulse 82, temperature 98 F (36.7 C), temperature source Oral, resp. rate 20, last menstrual period 07/21/2015, unknown if currently breastfeeding.  Physical Exam  Nursing note and vitals reviewed. Constitutional: She is oriented to person, place, and time. She appears well-developed and well-nourished. No distress.  HENT:  Head: Normocephalic.  Cardiovascular: Normal rate.   Respiratory: Effort normal.  GI: Soft.  Neurological: She is alert and oriented to person, place, and time.  Skin: Skin is warm and dry.  Psychiatric: She has a normal mood and affect.   Results for orders placed or performed during the hospital encounter of 09/30/15 (from the past 24 hour(s))  CBC     Status: Abnormal   Collection Time: 09/30/15  4:01 AM  Result Value Ref Range   WBC 8.0 4.0 - 10.5 K/uL   RBC 3.88 3.87 - 5.11 MIL/uL   Hemoglobin 11.3 (L) 12.0 - 15.0 g/dL   HCT 40.133.1 (L) 02.736.0 - 25.346.0 %   MCV 85.3 78.0 - 100.0 fL  MCH 29.1 26.0 - 34.0 pg   MCHC 34.1 30.0 - 36.0 g/dL   RDW 96.013.0 45.411.5 - 09.815.5 %   Platelets 288 150 - 400 K/uL    MAU Course  Procedures  MDM 0422: Patient refuses a pelvic exam. She states that she wants to leave, and FU with Dr. Clearance [name].   Assessment and Plan   1. SAB (spontaneous abortion)    Patient left AMA. She refused a pelvic exam or any further care.  Comfort measures reviewed  1st/2nd/3rd Trimester precautions  Bleeding precautions RX: none  Return to  MAU as needed FU with OB as planned  Follow-up Information    Follow up with Brock BadHARPER,CHARLES A, MD.   Specialty:  Obstetrics and Gynecology   Why:  As scheduled   Contact information:   [address] [place] [address] [phone]         Tawnya CrookHogan, Heather Donovan 09/30/2015, 4:20 AM

## 2015-10-07 ENCOUNTER — Ambulatory Visit (HOSPITAL_COMMUNITY): Admit: 2015-10-07 | Payer: Medicaid Other

## 2015-10-13 ENCOUNTER — Encounter: Payer: Medicaid Other | Admitting: Obstetrics

## 2015-10-14 ENCOUNTER — Ambulatory Visit (HOSPITAL_COMMUNITY): Admission: RE | Admit: 2015-10-14 | Payer: Medicaid Other | Source: Ambulatory Visit

## 2015-10-25 ENCOUNTER — Encounter: Payer: Self-pay | Admitting: Obstetrics

## 2015-10-25 ENCOUNTER — Ambulatory Visit (INDEPENDENT_AMBULATORY_CARE_PROVIDER_SITE_OTHER): Payer: Medicaid Other | Admitting: Obstetrics

## 2015-10-25 VITALS — BP 112/76 | HR 94 | Temp 98.4°F | Wt 243.0 lb

## 2015-10-25 DIAGNOSIS — O039 Complete or unspecified spontaneous abortion without complication: Secondary | ICD-10-CM | POA: Diagnosis not present

## 2015-10-25 NOTE — Progress Notes (Signed)
Patient states she thnks she passed her pregnancy- but didn't follow up at MAU

## 2015-10-26 NOTE — Progress Notes (Signed)
S:  No complaints.  Patients states that she miscarried at home and passed all POC.  No cramping are bleeding now. O: Afebrile VSS      PE:  Deferred A:  S/P SAB, complete.  Doing well. P: F/U in 6 weeks.  Coral Ceoharles Harper MD

## 2015-11-03 ENCOUNTER — Encounter (HOSPITAL_COMMUNITY): Payer: Self-pay | Admitting: Emergency Medicine

## 2015-11-03 ENCOUNTER — Emergency Department (HOSPITAL_COMMUNITY)
Admission: EM | Admit: 2015-11-03 | Discharge: 2015-11-03 | Disposition: A | Discharge status: Home / Self Care | Payer: Medicaid Other | Attending: Emergency Medicine | Admitting: Emergency Medicine

## 2015-11-03 ENCOUNTER — Other Ambulatory Visit: Payer: Self-pay | Admitting: Medical

## 2015-11-03 DIAGNOSIS — K029 Dental caries, unspecified: Secondary | ICD-10-CM | POA: Diagnosis not present

## 2015-11-03 DIAGNOSIS — Z87891 Personal history of nicotine dependence: Secondary | ICD-10-CM | POA: Insufficient documentation

## 2015-11-03 DIAGNOSIS — I10 Essential (primary) hypertension: Secondary | ICD-10-CM | POA: Diagnosis not present

## 2015-11-03 DIAGNOSIS — J039 Acute tonsillitis, unspecified: Secondary | ICD-10-CM

## 2015-11-03 DIAGNOSIS — K0889 Other specified disorders of teeth and supporting structures: Secondary | ICD-10-CM | POA: Diagnosis present

## 2015-11-03 DIAGNOSIS — Z79899 Other long term (current) drug therapy: Secondary | ICD-10-CM | POA: Diagnosis not present

## 2015-11-03 MED ORDER — NAPROXEN 500 MG PO TABS: 500.0000 mg | ORAL_TABLET | Freq: Two times a day (BID) | ORAL | 0 refills | Status: DC

## 2015-11-03 MED ORDER — KETOROLAC TROMETHAMINE 60 MG/2ML IM SOLN
60.0000 mg | Freq: Once | INTRAMUSCULAR | Status: DC
  Filled 2015-11-03: qty 2

## 2015-11-03 MED ORDER — AMOXICILLIN 500 MG PO CAPS: 500.0000 mg | ORAL_CAPSULE | Freq: Two times a day (BID) | ORAL | 0 refills | Status: DC

## 2015-11-03 MED ORDER — DEXAMETHASONE SODIUM PHOSPHATE 10 MG/ML IJ SOLN
10.0000 mg | Freq: Once | INTRAMUSCULAR | Status: DC
  Filled 2015-11-03: qty 1

## 2015-11-03 MED ORDER — PENICILLIN G BENZATHINE 1200000 UNIT/2ML IM SUSP
1.2000 10*6.[IU] | Freq: Once | INTRAMUSCULAR | Status: DC
  Filled 2015-11-03: qty 2

## 2015-11-03 NOTE — ED Notes (Signed)
PA made aware that pt refused all IM medications. Pt requesting prescriptions.

## 2015-11-03 NOTE — ED Triage Notes (Signed)
Pt is c/o dental pain on the left bottom  Pt states her gums are swollen and white  Pt states sxs started about 3 days ago

## 2015-11-03 NOTE — Discharge Instructions (Signed)
Take antibiotics as prescribed for tonsillitis. Take Naprosyn as needed for pain. Follow-up with her primary care doctor for symptoms do not improve in the next 3-4 days. He will also need to see a dentist for evaluation of your cavities. Return to the emergency department if you experience inability to swallow, fevers, chills, swelling of your neck, difficulty breathing.

## 2015-11-03 NOTE — ED Notes (Signed)
Pt given discharge instructions, verbalized understanding of need to follow up, reasons to return to the ED and medications to take at home. Pt denied further questions or concerns. Pt ambulated to exit without difficulty.

## 2015-11-03 NOTE — ED Provider Notes (Signed)
[facility]-EMERGENCY DEPT Provider Note   CSN: [id number] Arrival date & time: 11/03/15  2213  First Provider Contact:  None       History   Chief Complaint Chief Complaint  Patient presents with  . Dental Pain    HPI [name] is a 25 y.o. female with no significant pmhx who presents to the ED today c/o Sore throat and dental pain. Patient states that for the last 3 days she has developed pain on her back teeth as well as in the back of her throat. She states she is now having pain with swallowing. She also has bilateral ear pain and a headache. Patient reports that all she is wanted to do is sleep all day. She is unsure if she is been running fevers. She's been taking ibuprofen and Tylenol for her symptoms without relief. She does report chills. She denies any recent dental work. Patient is aware that she has severe Cavities. She still has her wisdom teeth. She denies any cough, runny nose.  HPI  Past Medical History:  Diagnosis Date  . Headache(784.0)   . Hypertension   . Preterm labor   . Urinary tract infection     Patient Active Problem List   Diagnosis Date Noted  . First trimester pregnancy 05/05/2015  . 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 Trichomonal infection 07/14/2014  . GBS (group B streptococcus) UTI complicating pregnancy 07/14/2014  . Vertigo 08/08/2011  . TOBACCO USE, QUIT 01/03/2009  . OBESITY 11/30/2008  . OTHER ABNORMALITY OF URINATION 11/30/2008    Past Surgical History:  Procedure Laterality Date  . BREAST FIBROADENOMA SURGERY  2006-7.   Removal;right  . CESAREAN SECTION    . DILATION AND CURETTAGE OF UTERUS    . THERAPEUTIC ABORTION      OB History    Gravida Para Term Preterm AB Living   SAB TAB Ectopic Multiple Live Births   Home Medications    Prior to Admission medications   Medication Sig Start Date End Date Taking? Authorizing Provider  acetaminophen-codeine (TYLENOL #3)  300-30 MG tablet Take 1-2 tablets by mouth every 6 (six) hours as needed for moderate pain. 09/28/15   Marny Lowenstein, PA-C  nitrofurantoin, macrocrystal-monohydrate, (MACROBID) 100 MG capsule Take 1 capsule (100 mg total) by mouth 2 (two) times daily. 09/25/15   Jean Rosenthal, NP  Prenat-FeFmCb-DSS-FA-DHA w/o A (CITRANATAL HARMONY) 27-1-260 MG CAPS Take 1 capsule by mouth daily. Patient not taking: Reported on 10/25/2015 09/06/15   Roe Coombs, CNM    Family History Family History  Problem Relation Age of Onset  . Diabetes Mother   . Diabetes Maternal Grandmother   . Hypertension Maternal Grandmother   . Cancer Maternal Grandmother     colon cancer, brain tumour  . Other Neg Hx     Social History Social History  Substance Use Topics  . Smoking status: Former Smoker    Packs/day: 0.25    Types: Cigarettes  . Smokeless tobacco: Never Used  . Alcohol use No     Allergies   Review of patient's allergies indicates no known allergies.   Review of Systems Review of Systems  All other systems reviewed and are negative.    Physical Exam Updated Vital Signs BP 123/56 (BP Location: Left Arm)   Pulse 92   Temp 98.8 F (37.1  C) (Oral)   Resp 18   Ht 5\' 4"  (1.626 m)   Wt 97.1 kg   LMP 10/30/2015 (Approximate)   SpO2 98%   BMI 36.73 kg/m   Physical Exam  Constitutional: She is oriented to person, place, and time. She appears well-developed and well-nourished. No distress.  HENT:  Head: Normocephalic and atraumatic.  Mouth/Throat: Oropharyngeal exudate present.  No PTA. Uvula midline. TMs normal b/l. Severe dental caries present. No obvious dental abscess. No swelling under tongue.  Eyes: Conjunctivae are normal. Right eye exhibits no discharge. Left eye exhibits no discharge. No scleral icterus.  Cardiovascular: Normal rate.   Pulmonary/Chest: Effort normal.  Lymphadenopathy:    She has cervical adenopathy.  Neurological: She is alert and oriented to person,  place, and time. Coordination normal.  Skin: Skin is warm and dry. No rash noted. She is not diaphoretic. No erythema. No pallor.  Psychiatric: She has a normal mood and affect. Her behavior is normal.  Nursing note and vitals reviewed.    ED Treatments / Results  Labs (all labs ordered are listed, but only abnormal results are displayed) Labs Reviewed - No data to display  EKG  EKG Interpretation None       Radiology No results found.  Procedures Procedures (including critical care time)  Medications Ordered in ED Medications  penicillin g benzathine (BICILLIN LA) 1200000 UNIT/2ML injection 1.2 Million Units (not administered)  dexamethasone (DECADRON) injection 10 mg (not administered)  ketorolac (TORADOL) injection 60 mg (not administered)     Initial Impression / Assessment and Plan / ED Course  I have reviewed the triage vital signs and the nursing notes.  Pertinent labs & imaging results that were available during my care of the patient were reviewed by me and considered in my medical decision making (see chart for details).  Clinical Course    Pt meets 3/4 centor criteria. She is afebrile in the ED but states that she took tylenol PTA. Clinically pt appears to have strep pharyngitis. Pt refusing injections. Will treat with oral amoxicillin and NSAIDS. Follow up with PCP. Pt also has severe dental caries. No obvious dental abscess or dental infection. However, pt does need dental follow up. Referral given. Return precautions outlined in patient discharge instructions.    Final Clinical Impressions(s) / ED Diagnoses   Final diagnoses:  Tonsillitis  Dental caries    New Prescriptions New Prescriptions   No medications on file     Dub Mikes, PA-C 11/04/15 1657    Arby Barrette, MD 11/10/15 2330

## 2015-12-06 ENCOUNTER — Ambulatory Visit (INDEPENDENT_AMBULATORY_CARE_PROVIDER_SITE_OTHER): Payer: Medicaid Other | Admitting: Obstetrics

## 2015-12-06 ENCOUNTER — Encounter: Payer: Self-pay | Admitting: Obstetrics

## 2015-12-06 VITALS — BP 119/79 | HR 99 | Wt 240.0 lb

## 2015-12-06 DIAGNOSIS — O3430 Maternal care for cervical incompetence, unspecified trimester: Secondary | ICD-10-CM

## 2015-12-06 DIAGNOSIS — Z3009 Encounter for other general counseling and advice on contraception: Secondary | ICD-10-CM | POA: Diagnosis not present

## 2015-12-06 NOTE — Progress Notes (Signed)
Patient ID: [name], female   DOB: [birth year], 25 y.o.   MRN: [id number]  Chief Complaint  Patient presents with  . Follow-up    SAB in July    HPI Breanna EmeryMary E Tanguma is a 25 y.o. female.  H/O several early pregnancy losses and several preterm deliveries.  Presents for consultation for management of future pregnancies. HPI  Past Medical History:  Diagnosis Date  . Headache(784.0)   . Hypertension   . Preterm labor   . Urinary tract infection     Past Surgical History:  Procedure Laterality Date  . BREAST FIBROADENOMA SURGERY  2006-7.   Removal;right  . CESAREAN SECTION    . DILATION AND CURETTAGE OF UTERUS    . THERAPEUTIC ABORTION      Family History  Problem Relation Age of Onset  . Diabetes Mother   . Diabetes Maternal Grandmother   . Hypertension Maternal Grandmother   . Cancer Maternal Grandmother     colon cancer, brain tumour  . Other Neg Hx     Social History Social History  Substance Use Topics  . Smoking status: Former Smoker    Packs/day: 0.25    Types: Cigarettes  . Smokeless tobacco: Never Used  . Alcohol use No    No Known Allergies  Current Outpatient Prescriptions  Medication Sig Dispense Refill  . acetaminophen-codeine (TYLENOL #3) 300-30 MG tablet Take 1-2 tablets by mouth every 6 (six) hours as needed for moderate pain. (Patient not taking: Reported on 12/06/2015) 15 tablet 0  . naproxen (NAPROSYN) 500 MG tablet Take 1 tablet (500 mg total) by mouth 2 (two) times daily. (Patient not taking: Reported on 12/06/2015) 30 tablet 0  . Prenat-FeFmCb-DSS-FA-DHA w/o A (CITRANATAL HARMONY) 27-1-260 MG CAPS Take 1 capsule by mouth daily. (Patient not taking: Reported on 10/25/2015) 30 capsule 11   No current facility-administered medications for this visit.     Review of Systems Review of Systems Constitutional: negative for fatigue and weight loss Respiratory: negative for cough and wheezing Cardiovascular: negative for chest pain, fatigue and  palpitations Gastrointestinal: negative for abdominal pain and change in bowel habits Genitourinary:negative Integument/breast: negative for nipple discharge Musculoskeletal:negative for myalgias Neurological: negative for gait problems and tremors Behavioral/Psych: negative for abusive relationship, depression Endocrine: negative for temperature intolerance     Blood pressure 119/79, pulse 99, weight 240 lb (108.9 kg), last menstrual period 11/28/2015, unknown if currently breastfeeding.  Physical Exam Physical Exam:  Deferred >50% of 10 min visit spent on counseling and coordination of care.   Data Reviewed Labs  Assessment     Uterine cervical insufficiency    Plan    May need cerclage with future pregnancies.  Will need early referral to MFM. F/U prn  No orders of the defined types were placed in this encounter.  No orders of the defined types were placed in this encounter.

## 2016-01-11 ENCOUNTER — Encounter (HOSPITAL_COMMUNITY): Payer: Self-pay

## 2016-01-11 ENCOUNTER — Inpatient Hospital Stay (HOSPITAL_COMMUNITY)
Admission: AD | Admit: 2016-01-11 | Discharge: 2016-01-11 | Disposition: A | Discharge status: Home / Self Care | Payer: Medicaid Other | Source: Ambulatory Visit | Attending: Obstetrics and Gynecology | Admitting: Obstetrics and Gynecology

## 2016-01-11 DIAGNOSIS — R8290 Unspecified abnormal findings in urine: Secondary | ICD-10-CM | POA: Diagnosis not present

## 2016-01-11 DIAGNOSIS — Z3202 Encounter for pregnancy test, result negative: Secondary | ICD-10-CM | POA: Insufficient documentation

## 2016-01-11 DIAGNOSIS — R8299 Other abnormal findings in urine: Secondary | ICD-10-CM | POA: Diagnosis not present

## 2016-01-11 DIAGNOSIS — Z3200 Encounter for pregnancy test, result unknown: Secondary | ICD-10-CM | POA: Diagnosis present

## 2016-01-11 LAB — URINALYSIS, ROUTINE W REFLEX MICROSCOPIC
BILIRUBIN URINE: NEGATIVE
Glucose, UA: NEGATIVE mg/dL
HGB URINE DIPSTICK: NEGATIVE
KETONES UR: NEGATIVE mg/dL
Leukocytes, UA: NEGATIVE
Nitrite: NEGATIVE
PROTEIN: NEGATIVE mg/dL
Specific Gravity, Urine: 1.02 (ref 1.005–1.030)
pH: 6 (ref 5.0–8.0)

## 2016-01-11 LAB — POCT PREGNANCY, URINE: PREG TEST UR: NEGATIVE

## 2016-01-11 NOTE — MAU Provider Note (Signed)
S:  [name] is a 25 y.o. female [phone] here with an abnormal smell to her urine that she has noticed over the last few days " I think I have a UTI". She also wants a blood test to see if she is pregnant.  She denies abdominal pain or abnormal vaginal discharge.  She declines STI test today.   O:  GENERAL: Well-developed, well-nourished female in no acute distress.  LUNGS: Effort normal SKIN: Warm, dry and without erythema PSYCH: Normal mood and affect   Vitals:   01/11/16 2107  BP: 112/70  Pulse: 98  Resp: 19  Temp: 98.5 F (36.9 C)   MDM:  Results for orders placed or performed during the hospital encounter of 01/11/16 (from the past 48 hour(s))  Urinalysis, Routine w reflex microscopic (not at [facility])     Status: None   Collection Time: 01/11/16  9:10 PM  Result Value Ref Range   Color, Urine YELLOW YELLOW   APPearance CLEAR CLEAR   Specific Gravity, Urine 1.020 1.005 - 1.030   pH 6.0 5.0 - 8.0   Glucose, UA NEGATIVE NEGATIVE mg/dL   Hgb urine dipstick NEGATIVE NEGATIVE   Bilirubin Urine NEGATIVE NEGATIVE   Ketones, ur NEGATIVE NEGATIVE mg/dL   Protein, ur NEGATIVE NEGATIVE mg/dL   Nitrite NEGATIVE NEGATIVE   Leukocytes, UA NEGATIVE NEGATIVE    Comment: MICROSCOPIC NOT DONE ON URINES WITH NEGATIVE PROTEIN, BLOOD, LEUKOCYTES, NITRITE, OR GLUCOSE <1000 mg/dL.  Pregnancy, urine POC     Status: None   Collection Time: 01/11/16  9:20 PM  Result Value Ref Range   Preg Test, Ur NEGATIVE NEGATIVE    Comment:        THE SENSITIVITY OF THIS METHODOLOGY IS >24 mIU/mL     A:  1. Encounter for pregnancy test with result negative   2. Abnormal urine odor     P:  Discharge home in stable condition Follow up with PCP- patient requests to follow up there for STI testing At home pregnancy tests encouraged Return to MAU for emergencies only   Duane LopeJennifer I Rasch, NP 01/11/2016 11:06 PM

## 2016-01-11 NOTE — Discharge Instructions (Signed)
Pregnancy Test Information °WHAT IS A PREGNANCY TEST? °A pregnancy test is used to detect the presence of human chorionic gonadotropin (hCG) in a sample of your urine or blood. hCG is a hormone produced by the cells of the placenta. The placenta is the organ that forms to nourish and support a developing baby. °This test requires a sample of either blood or urine. A pregnancy test determines whether you are pregnant or not. °HOW ARE PREGNANCY TESTS DONE? °Pregnancy tests are done using a home pregnancy test or having a blood or urine test done at your health care provider's office.  °Home pregnancy tests require a urine sample. °· Most kits use a plastic testing device with a strip of paper that indicates whether there is hCG in your urine. °· Follow the test instructions very carefully. °· After you urinate on the test stick, markings will appear to let you know whether you are pregnant. °· For best results, use your first urine of the morning. That is when the concentration of hCG is highest. °Having a blood test to check for pregnancy requires a sample of blood drawn from a vein in your hand or arm. Your health care provider will send your sample to a lab for testing. Results of a pregnancy test will be positive or negative. °IS ONE TYPE OF PREGNANCY TEST BETTER THAN ANOTHER? °In some cases, a blood test will return a positive result even if a urine test was negative because blood tests are more sensitive. This means blood tests can detect hCG earlier than home pregnancy tests.  °HOW ACCURATE ARE HOME PREGNANCY TESTS?  °Both types of pregnancy tests are very accurate. °· A blood test is about 98% accurate. °· When you are far enough along in your pregnancy and when used correctly, home pregnancy tests are equally accurate. °CAN ANYTHING INTERFERE WITH HOME PREGNANCY TEST RESULTS?  °It is possible for certain conditions to cause an inaccurate test result (false positive or false negative). °· A false positive is a  positive test result when you are not pregnant. This can happen if you: °¨ Are taking certain medicines, including anticonvulsants or tranquilizers. °¨ Have certain proteins in your blood. °· A false negative is a negative test result when you are pregnant. This can happen if you: °¨ Took the test before there was enough hCG to detect. A pregnancy test will not be positive in most women until 3-4 weeks after conception. °¨ Drank a lot of liquid before the test. Diluted urine samples can sometimes give an inaccurate result. °¨ Take certain medicines, such as water pills (diuretics) or some antihistamines. °WHAT SHOULD I DO IF I HAVE A POSITIVE PREGNANCY TEST? °If you have a positive pregnancy test, schedule an appointment with your health care provider. You might need additional testing to confirm the pregnancy. In the meantime, begin taking a prenatal vitamin, stop smoking, stop drinking alcohol, and do not use street drugs. °Talk to your health care provider about how to take care of yourself during your pregnancy. Ask about what to expect from the care you will need throughout pregnancy (prenatal care). °  °This information is not intended to replace advice given to you by your health care provider. Make sure you discuss any questions you have with your health care provider. °  °Document Released: 03/29/2003 Document Revised: 04/16/2014 Document Reviewed: 07/21/2013 °Elsevier Interactive Patient Education ©2016 [employer] ° °

## 2016-01-11 NOTE — MAU Note (Signed)
Pt thinks that she has a UTI. States that she has increase in frequency with urination and sometimes urinating small amounts. Urine has an odor. Denies burning. Has a small amount of lower abdominal pain-cramping. Rates 7/10. LMP: 12/26/2015.

## 2016-01-21 ENCOUNTER — Inpatient Hospital Stay (HOSPITAL_COMMUNITY)
Admission: AD | Admit: 2016-01-21 | Discharge: 2016-01-21 | Discharge status: Against Medical Advice | Payer: Medicaid Other | Source: Ambulatory Visit | Attending: Obstetrics and Gynecology | Admitting: Obstetrics and Gynecology

## 2016-01-21 ENCOUNTER — Encounter (HOSPITAL_COMMUNITY): Payer: Self-pay | Admitting: *Deleted

## 2016-01-21 DIAGNOSIS — Z3A01 Less than 8 weeks gestation of pregnancy: Secondary | ICD-10-CM

## 2016-01-21 DIAGNOSIS — R109 Unspecified abdominal pain: Secondary | ICD-10-CM | POA: Diagnosis not present

## 2016-01-21 DIAGNOSIS — Z3202 Encounter for pregnancy test, result negative: Secondary | ICD-10-CM | POA: Diagnosis not present

## 2016-01-21 DIAGNOSIS — Z87891 Personal history of nicotine dependence: Secondary | ICD-10-CM | POA: Insufficient documentation

## 2016-01-21 DIAGNOSIS — Z3200 Encounter for pregnancy test, result unknown: Secondary | ICD-10-CM | POA: Diagnosis present

## 2016-01-21 DIAGNOSIS — O9989 Other specified diseases and conditions complicating pregnancy, childbirth and the puerperium: Secondary | ICD-10-CM | POA: Diagnosis not present

## 2016-01-21 LAB — URINALYSIS, ROUTINE W REFLEX MICROSCOPIC
BILIRUBIN URINE: NEGATIVE
Glucose, UA: NEGATIVE mg/dL
HGB URINE DIPSTICK: NEGATIVE
Ketones, ur: NEGATIVE mg/dL
Leukocytes, UA: NEGATIVE
Nitrite: NEGATIVE
PH: 5.5 (ref 5.0–8.0)
Protein, ur: NEGATIVE mg/dL
SPECIFIC GRAVITY, URINE: 1.02 (ref 1.005–1.030)

## 2016-01-21 LAB — HCG, QUANTITATIVE, PREGNANCY: HCG, BETA CHAIN, QUANT, S: 28 m[IU]/mL — AB (ref <5)

## 2016-01-21 LAB — POCT PREGNANCY, URINE: Preg Test, Ur: NEGATIVE

## 2016-01-21 NOTE — MAU Provider Note (Signed)
History     CSN: [id number]  Arrival date and time: 01/21/16 1014   First Provider Initiated Contact with Patient 01/21/16 1038      Chief Complaint  Patient presents with  . Abdominal Pain  . Back Pain   HPI   [name] is a 25 y.o. female [phone] @ [redacted]w[redacted]d here in MAU for a pregnancy confirmation. The patient has frequent MAU visits for pregnancy tests. She had a positive pregnancy test 10 minutes prior to her arrival. She is here to confirm her test.  She complains of bilateral lower abdominal cramping that started today. She denies bleeding.   The MAU pregnancy test was negative.   OB History    Gravida Para Term Preterm AB Living   5 1   1 3 2    SAB TAB Ectopic Multiple Live Births   2 1   1 2       Past Medical History:  Diagnosis Date  . Headache(784.0)   . Hypertension   . Preterm labor   . Urinary tract infection     Past Surgical History:  Procedure Laterality Date  . BREAST FIBROADENOMA SURGERY  2006-7.   Removal;right  . CESAREAN SECTION    . DILATION AND CURETTAGE OF UTERUS    . THERAPEUTIC ABORTION      Family History  Problem Relation Age of Onset  . Diabetes Mother   . Diabetes Maternal Grandmother   . Hypertension Maternal Grandmother   . Cancer Maternal Grandmother     colon cancer, brain tumour  . Other Neg Hx     Social History  Substance Use Topics  . Smoking status: Former Smoker    Packs/day: 0.25    Types: Cigarettes  . Smokeless tobacco: Never Used  . Alcohol use No    Allergies: No Known Allergies  Prescriptions Prior to Admission  Medication Sig Dispense Refill Last Dose  . acetaminophen-codeine (TYLENOL #3) 300-30 MG tablet Take 1-2 tablets by mouth every 6 (six) hours as needed for moderate pain. (Patient not taking: Reported on 01/21/2016) 15 tablet 0 Not Taking at Unknown time  . naproxen (NAPROSYN) 500 MG tablet Take 1 tablet (500 mg total) by mouth 2 (two) times daily. (Patient not taking: Reported on  01/21/2016) 30 tablet 0 Not Taking at Unknown time   Results for orders placed or performed during the hospital encounter of 01/21/16 (from the past 48 hour(s))  Urinalysis, Routine w reflex microscopic (not at [facility])     Status: None   Collection Time: 01/21/16 10:20 AM  Result Value Ref Range   Color, Urine YELLOW YELLOW   APPearance CLEAR CLEAR   Specific Gravity, Urine 1.020 1.005 - 1.030   pH 5.5 5.0 - 8.0   Glucose, UA NEGATIVE NEGATIVE mg/dL   Hgb urine dipstick NEGATIVE NEGATIVE   Bilirubin Urine NEGATIVE NEGATIVE   Ketones, ur NEGATIVE NEGATIVE mg/dL   Protein, ur NEGATIVE NEGATIVE mg/dL   Nitrite NEGATIVE NEGATIVE   Leukocytes, UA NEGATIVE NEGATIVE    Comment: MICROSCOPIC NOT DONE ON URINES WITH NEGATIVE PROTEIN, BLOOD, LEUKOCYTES, NITRITE, OR GLUCOSE <1000 mg/dL.  Pregnancy, urine POC     Status: None   Collection Time: 01/21/16 10:30 AM  Result Value Ref Range   Preg Test, Ur NEGATIVE NEGATIVE    Comment:        THE SENSITIVITY OF THIS METHODOLOGY IS >24 mIU/mL   hCG, quantitative, pregnancy     Status: Abnormal   Collection Time: 01/21/16 11:09 AM  Result Value Ref Range   hCG, Beta Chain, Quant, S 28 (H) <5 mIU/mL    Comment:          GEST. AGE      CONC.  (mIU/mL)   <=1 WEEK        5 - 50     2 WEEKS       50 - 500     3 WEEKS       100 - 10,000     4 WEEKS     1,000 - 30,000     5 WEEKS     3,500 - 115,000   6-8 WEEKS     12,000 - 270,000    12 WEEKS     15,000 - 220,000        FEMALE AND NON-PREGNANT FEMALE:     LESS THAN 5 mIU/mL    Review of Systems  Constitutional: Negative for chills and fever.  Gastrointestinal: Positive for abdominal pain. Negative for nausea and vomiting.   Physical Exam   Blood pressure 120/76, pulse 95, temperature 98.1 F (36.7 C), temperature source Oral, resp. rate 18, height 5\' 4"  (1.626 m), weight 241 lb 3.2 oz (109.4 kg), last menstrual period 12/26/2015, SpO2 99 %, unknown if currently breastfeeding.  Physical Exam   Constitutional: She is oriented to person, place, and time. She appears well-developed and well-nourished. No distress.  Respiratory: Effort normal.  Musculoskeletal: Normal range of motion.  Neurological: She is alert and oriented to person, place, and time.  Skin: Skin is warm.  Psychiatric: Her behavior is normal.    MAU Course  Procedures  None  MDM Pregnancy test brought in by patient from home is positive, with a very faint line. Will wait for Quant to result before ordering US.  Patient upset with wait time. Patient left the room AMA.   Assessment and Plan   A:  -Encounter for pregnancy test with results position -Patient left AMA -Attempted to call the patient with results and to schedule a 48 hour follow up quant; phone number listed is not in service.   Duane LopeJennifer I Fany Cavanaugh, NP 01/22/2016 1:32 PM

## 2016-01-21 NOTE — MAU Note (Signed)
Pt states she is having lower abdominal and back cramps that have been going on for two days.  Pt states she took a pregnancy test 10 minutes before she came and it was positive.

## 2016-01-21 NOTE — MAU Note (Signed)
Pt left AMA °

## 2016-01-23 ENCOUNTER — Other Ambulatory Visit: Payer: Medicaid Other

## 2016-01-23 ENCOUNTER — Telehealth: Payer: Self-pay

## 2016-01-23 NOTE — Telephone Encounter (Signed)
Attempted to reach patient. Patient  Missed her quant level check. I attempted to call patient but there was no answer or voicemail to leave a message. I will attempt to call patient tomorrow to schedule her appointment.

## 2016-02-01 ENCOUNTER — Ambulatory Visit: Payer: Medicaid Other | Admitting: Obstetrics

## 2016-02-03 ENCOUNTER — Emergency Department (HOSPITAL_COMMUNITY)
Admission: EM | Admit: 2016-02-03 | Discharge: 2016-02-03 | Disposition: A | Discharge status: Home / Self Care | Payer: Medicaid Other | Attending: Emergency Medicine | Admitting: Emergency Medicine

## 2016-02-03 ENCOUNTER — Encounter (HOSPITAL_COMMUNITY): Payer: Self-pay

## 2016-02-03 DIAGNOSIS — I10 Essential (primary) hypertension: Secondary | ICD-10-CM | POA: Diagnosis not present

## 2016-02-03 DIAGNOSIS — R079 Chest pain, unspecified: Secondary | ICD-10-CM | POA: Insufficient documentation

## 2016-02-03 DIAGNOSIS — M549 Dorsalgia, unspecified: Secondary | ICD-10-CM | POA: Diagnosis not present

## 2016-02-03 DIAGNOSIS — Z87891 Personal history of nicotine dependence: Secondary | ICD-10-CM | POA: Insufficient documentation

## 2016-02-03 LAB — CBC
HCT: 39.7 % (ref 36.0–46.0)
Hemoglobin: 13 g/dL (ref 12.0–15.0)
MCH: 28.7 pg (ref 26.0–34.0)
MCHC: 32.7 g/dL (ref 30.0–36.0)
MCV: 87.6 fL (ref 78.0–100.0)
PLATELETS: 265 10*3/uL (ref 150–400)
RBC: 4.53 MIL/uL (ref 3.87–5.11)
RDW: 12.9 % (ref 11.5–15.5)
WBC: 4.9 10*3/uL (ref 4.0–10.5)

## 2016-02-03 LAB — BASIC METABOLIC PANEL
ANION GAP: 5 (ref 5–15)
BUN: 5 mg/dL — AB (ref 6–20)
CALCIUM: 8.9 mg/dL (ref 8.9–10.3)
CO2: 27 mmol/L (ref 22–32)
Chloride: 106 mmol/L (ref 101–111)
Creatinine, Ser: 0.95 mg/dL (ref 0.44–1.00)
GFR calc Af Amer: >60 mL/min (ref >60)
GLUCOSE: 119 mg/dL — AB (ref 65–99)
Potassium: 3.3 mmol/L — ABNORMAL LOW (ref 3.5–5.1)
Sodium: 138 mmol/L (ref 135–145)

## 2016-02-03 LAB — I-STAT TROPONIN, ED: Troponin i, poc: 0 ng/mL (ref 0.00–0.08)

## 2016-02-03 NOTE — ED Triage Notes (Signed)
Per pT, Pt is coming from home with complaints of chest pain and back pain that started two days ago. Reports cough and congestion. Denies SOB. Had one episode of vomiting, but denies N/V/D otherwise.

## 2016-03-27 ENCOUNTER — Emergency Department (HOSPITAL_COMMUNITY)
Admission: EM | Admit: 2016-03-27 | Discharge: 2016-03-27 | Disposition: A | Discharge status: Home / Self Care | Payer: Medicaid Other | Attending: Dermatology | Admitting: Dermatology

## 2016-03-27 ENCOUNTER — Encounter (HOSPITAL_COMMUNITY): Payer: Self-pay | Admitting: Emergency Medicine

## 2016-03-27 DIAGNOSIS — Y999 Unspecified external cause status: Secondary | ICD-10-CM | POA: Insufficient documentation

## 2016-03-27 DIAGNOSIS — Z87891 Personal history of nicotine dependence: Secondary | ICD-10-CM | POA: Diagnosis not present

## 2016-03-27 DIAGNOSIS — Y939 Activity, unspecified: Secondary | ICD-10-CM | POA: Diagnosis not present

## 2016-03-27 DIAGNOSIS — S61412A Laceration without foreign body of left hand, initial encounter: Secondary | ICD-10-CM | POA: Diagnosis not present

## 2016-03-27 DIAGNOSIS — S41111A Laceration without foreign body of right upper arm, initial encounter: Secondary | ICD-10-CM | POA: Insufficient documentation

## 2016-03-27 DIAGNOSIS — I10 Essential (primary) hypertension: Secondary | ICD-10-CM | POA: Insufficient documentation

## 2016-03-27 DIAGNOSIS — Y929 Unspecified place or not applicable: Secondary | ICD-10-CM | POA: Diagnosis not present

## 2016-03-27 DIAGNOSIS — S0101XA Laceration without foreign body of scalp, initial encounter: Secondary | ICD-10-CM | POA: Insufficient documentation

## 2016-03-27 DIAGNOSIS — Z5321 Procedure and treatment not carried out due to patient leaving prior to being seen by health care provider: Secondary | ICD-10-CM | POA: Insufficient documentation
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
## 2016-07-12 ENCOUNTER — Encounter (HOSPITAL_COMMUNITY): Payer: Self-pay | Admitting: Emergency Medicine

## 2016-07-12 DIAGNOSIS — S3992XA Unspecified injury of lower back, initial encounter: Secondary | ICD-10-CM | POA: Diagnosis not present

## 2016-07-12 DIAGNOSIS — S79922A Unspecified injury of left thigh, initial encounter: Secondary | ICD-10-CM | POA: Diagnosis present

## 2016-07-12 DIAGNOSIS — F17210 Nicotine dependence, cigarettes, uncomplicated: Secondary | ICD-10-CM | POA: Diagnosis not present

## 2016-07-12 DIAGNOSIS — S79912A Unspecified injury of left hip, initial encounter: Secondary | ICD-10-CM | POA: Insufficient documentation

## 2016-07-12 DIAGNOSIS — I10 Essential (primary) hypertension: Secondary | ICD-10-CM | POA: Insufficient documentation

## 2016-07-12 DIAGNOSIS — Z79899 Other long term (current) drug therapy: Secondary | ICD-10-CM | POA: Diagnosis not present

## 2016-07-12 DIAGNOSIS — Y9389 Activity, other specified: Secondary | ICD-10-CM | POA: Diagnosis not present

## 2016-07-12 DIAGNOSIS — N3000 Acute cystitis without hematuria: Secondary | ICD-10-CM | POA: Insufficient documentation

## 2016-07-12 DIAGNOSIS — Y999 Unspecified external cause status: Secondary | ICD-10-CM | POA: Insufficient documentation

## 2016-07-12 DIAGNOSIS — Y92410 Unspecified street and highway as the place of occurrence of the external cause: Secondary | ICD-10-CM | POA: Insufficient documentation
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
## 2016-07-13 ENCOUNTER — Emergency Department (HOSPITAL_COMMUNITY)
Admission: EM | Admit: 2016-07-13 | Discharge: 2016-07-13 | Disposition: A | Discharge status: Home / Self Care | Payer: Medicaid Other | Attending: Emergency Medicine | Admitting: Emergency Medicine

## 2016-07-13 DIAGNOSIS — M7918 Myalgia, other site: Secondary | ICD-10-CM

## 2016-07-13 DIAGNOSIS — N3000 Acute cystitis without hematuria: Secondary | ICD-10-CM

## 2016-07-13 LAB — URINALYSIS, ROUTINE W REFLEX MICROSCOPIC
BILIRUBIN URINE: NEGATIVE
Glucose, UA: NEGATIVE mg/dL
KETONES UR: NEGATIVE mg/dL
LEUKOCYTES UA: NEGATIVE
Nitrite: POSITIVE — AB
Protein, ur: 100 mg/dL — AB
Specific Gravity, Urine: 1.025 (ref 1.005–1.030)
pH: 5 (ref 5.0–8.0)

## 2016-07-13 LAB — CBC WITH DIFFERENTIAL/PLATELET
BASOS ABS: 0 10*3/uL (ref 0.0–0.1)
Basophils Relative: 0 %
Eosinophils Absolute: 0.1 10*3/uL (ref 0.0–0.7)
Eosinophils Relative: 2 %
HEMATOCRIT: 38.9 % (ref 36.0–46.0)
Hemoglobin: 12.7 g/dL (ref 12.0–15.0)
LYMPHS ABS: 3 10*3/uL (ref 0.7–4.0)
Lymphocytes Relative: 40 %
MCH: 28.5 pg (ref 26.0–34.0)
MCHC: 32.6 g/dL (ref 30.0–36.0)
MCV: 87.4 fL (ref 78.0–100.0)
MONO ABS: 0.5 10*3/uL (ref 0.1–1.0)
Monocytes Relative: 6 %
NEUTROS ABS: 3.8 10*3/uL (ref 1.7–7.7)
Neutrophils Relative %: 52 %
Platelets: 318 10*3/uL (ref 150–400)
RBC: 4.45 MIL/uL (ref 3.87–5.11)
RDW: 12.8 % (ref 11.5–15.5)
WBC: 7.5 10*3/uL (ref 4.0–10.5)

## 2016-07-13 LAB — BASIC METABOLIC PANEL
ANION GAP: 11 (ref 5–15)
BUN: 6 mg/dL (ref 6–20)
CO2: 23 mmol/L (ref 22–32)
Calcium: 9.2 mg/dL (ref 8.9–10.3)
Chloride: 104 mmol/L (ref 101–111)
Creatinine, Ser: 1.16 mg/dL — ABNORMAL HIGH (ref 0.44–1.00)
GFR calc Af Amer: >60 mL/min (ref >60)
GFR calc non Af Amer: >60 mL/min (ref >60)
GLUCOSE: 121 mg/dL — AB (ref 65–99)
POTASSIUM: 3.5 mmol/L (ref 3.5–5.1)
Sodium: 138 mmol/L (ref 135–145)

## 2016-07-13 LAB — I-STAT BETA HCG BLOOD, ED (MC, WL, AP ONLY)

## 2016-07-13 MED ORDER — CEPHALEXIN 500 MG PO CAPS: 500.0000 mg | ORAL_CAPSULE | Freq: Four times a day (QID) | ORAL | 0 refills | Status: DC

## 2016-07-13 MED ORDER — IBUPROFEN 800 MG PO TABS: 800.0000 mg | ORAL_TABLET | Freq: Three times a day (TID) | ORAL | 0 refills | Status: DC

## 2016-07-13 MED ORDER — CYCLOBENZAPRINE HCL 10 MG PO TABS: 10.0000 mg | ORAL_TABLET | Freq: Two times a day (BID) | ORAL | 0 refills | Status: DC | PRN

## 2016-07-13 NOTE — ED Notes (Signed)
Pt verbalized understanding of d/c instructions and has no further questions. Pt stable and NAD. VSS.  
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
## 2016-08-14 ENCOUNTER — Inpatient Hospital Stay (HOSPITAL_COMMUNITY)
Admission: AD | Admit: 2016-08-14 | Discharge: 2016-08-14 | Disposition: A | Discharge status: Home / Self Care | Payer: Medicaid Other | Source: Ambulatory Visit | Attending: Family Medicine | Admitting: Family Medicine

## 2016-08-14 ENCOUNTER — Encounter (HOSPITAL_COMMUNITY): Payer: Self-pay

## 2016-08-14 DIAGNOSIS — Z833 Family history of diabetes mellitus: Secondary | ICD-10-CM | POA: Diagnosis not present

## 2016-08-14 DIAGNOSIS — O9989 Other specified diseases and conditions complicating pregnancy, childbirth and the puerperium: Secondary | ICD-10-CM

## 2016-08-14 DIAGNOSIS — Z9889 Other specified postprocedural states: Secondary | ICD-10-CM | POA: Diagnosis not present

## 2016-08-14 DIAGNOSIS — O26899 Other specified pregnancy related conditions, unspecified trimester: Secondary | ICD-10-CM

## 2016-08-14 DIAGNOSIS — Z800 Family history of malignant neoplasm of digestive organs: Secondary | ICD-10-CM | POA: Diagnosis not present

## 2016-08-14 DIAGNOSIS — I10 Essential (primary) hypertension: Secondary | ICD-10-CM | POA: Insufficient documentation

## 2016-08-14 DIAGNOSIS — F17210 Nicotine dependence, cigarettes, uncomplicated: Secondary | ICD-10-CM | POA: Diagnosis not present

## 2016-08-14 DIAGNOSIS — Z3201 Encounter for pregnancy test, result positive: Secondary | ICD-10-CM | POA: Insufficient documentation

## 2016-08-14 DIAGNOSIS — Z79899 Other long term (current) drug therapy: Secondary | ICD-10-CM | POA: Diagnosis not present

## 2016-08-14 DIAGNOSIS — Z87440 Personal history of urinary (tract) infections: Secondary | ICD-10-CM | POA: Insufficient documentation

## 2016-08-14 DIAGNOSIS — Z3491 Encounter for supervision of normal pregnancy, unspecified, first trimester: Secondary | ICD-10-CM

## 2016-08-14 DIAGNOSIS — Z8249 Family history of ischemic heart disease and other diseases of the circulatory system: Secondary | ICD-10-CM | POA: Insufficient documentation

## 2016-08-14 DIAGNOSIS — R109 Unspecified abdominal pain: Secondary | ICD-10-CM | POA: Insufficient documentation

## 2016-08-14 LAB — URINALYSIS, ROUTINE W REFLEX MICROSCOPIC
Bilirubin Urine: NEGATIVE
Glucose, UA: NEGATIVE mg/dL
Hgb urine dipstick: NEGATIVE
Ketones, ur: NEGATIVE mg/dL
LEUKOCYTES UA: NEGATIVE
NITRITE: NEGATIVE
PH: 6 (ref 5.0–8.0)
Protein, ur: NEGATIVE mg/dL
SPECIFIC GRAVITY, URINE: 1.015 (ref 1.005–1.030)

## 2016-08-14 LAB — POCT PREGNANCY, URINE: PREG TEST UR: POSITIVE — AB

## 2016-08-14 MED ORDER — PRENATAL VITAMIN 27-0.8 MG PO TABS: 1.0000 | ORAL_TABLET | Freq: Every day | ORAL | 3 refills | Status: DC

## 2016-08-14 NOTE — Discharge Instructions (Signed)
Abdominal Pain During Pregnancy °Belly (abdominal) pain is common during pregnancy. Most of the time, it is not a serious problem. Other times, it can be a sign that something is wrong with the pregnancy. Always tell your doctor if you have belly pain. °Follow these instructions at home: °Monitor your belly pain for any changes. The following actions may help you feel better: °· Do not have sex (intercourse) or put anything in your vagina until you feel better. °· Rest until your pain stops. °· Drink clear fluids if you feel sick to your stomach (nauseous). Do not eat solid food until you feel better. °· Only take medicine as told by your doctor. °· Keep all doctor visits as told. °Get help right away if: °· You are bleeding, leaking fluid, or pieces of tissue come out of your vagina. °· You have more pain or cramping. °· You keep throwing up (vomiting). °· You have pain when you pee (urinate) or have blood in your pee. °· You have a fever. °· You do not feel your baby moving as much. °· You feel very weak or feel like passing out. °· You have trouble breathing, with or without belly pain. °· You have a very bad headache and belly pain. °· You have fluid leaking from your vagina and belly pain. °· You keep having watery poop (diarrhea). °· Your belly pain does not go away after resting, or the pain gets worse. °This information is not intended to replace advice given to you by your health care provider. Make sure you discuss any questions you have with your health care provider. °Document Released: 03/14/2009 Document Revised: 11/02/2015 Document Reviewed: 10/23/2012 °Elsevier Interactive Patient Education © 2017 [employer] ° °

## 2016-08-14 NOTE — MAU Note (Addendum)
Patient states that she thinks she has a uti. Reports intermittent abdominal pain x1 month. No pain at present. Denies any urinary problems; states would like an antibiotic. Denies vaginal bleeding or discharge. LMP 07/12/16. Took HPT all 3 were positive

## 2016-08-14 NOTE — MAU Provider Note (Signed)
History     CSN: [id number]  Arrival date and time: 08/14/16 1205   First Provider Initiated Contact with Patient 08/14/16 1324      Chief Complaint  Patient presents with  . Urinary Tract Infection  . Abdominal Pain  . 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 The pain happens over the last few days that she has 0 pain today. She reports she thinks she might have a UTI. She reports her urine is dark and very foul-smelling. She denies vaginal discharge or vaginal bleeding. She did take home pregnancy tests and they were positive. She denies urinary frequency or urgency or dysuria. She has no back pain.    OB History    Gravida Para Term Preterm AB Living   6 1   1 4 3    SAB TAB Ectopic Multiple Live Births   3 1   1 3       Past Medical History:  Diagnosis Date  . Headache(784.0)   . Hypertension   . Preterm labor   . Urinary tract infection     Past Surgical History:  Procedure Laterality Date  . BREAST FIBROADENOMA SURGERY  2006-7.   Removal;right  . CESAREAN SECTION    . DILATION AND CURETTAGE OF UTERUS    . THERAPEUTIC ABORTION      Family History  Problem Relation Age of Onset  . Diabetes Mother   . Diabetes Maternal Grandmother   . Hypertension Maternal Grandmother   . Cancer Maternal Grandmother     colon cancer, brain tumour  . Other Neg Hx     Social History  Substance Use Topics  . Smoking status: Current Every Day Smoker    Packs/day: 0.25    Types: Cigarettes  . Smokeless tobacco: Never Used  . Alcohol use No    Allergies: No Known Allergies  Prescriptions Prior to Admission  Medication Sig Dispense Refill Last Dose  . acetaminophen-codeine (TYLENOL #3) 300-30 MG tablet Take 1-2 tablets by mouth every 6 (six) hours as needed for moderate pain. (Patient not taking: Reported on 02/03/2016) 15 tablet 0 Not  Taking at Unknown time  . cephALEXin (KEFLEX) 500 MG capsule Take 1 capsule (500 mg total) by mouth 4 (four) times daily. 20 capsule 0   . cyclobenzaprine (FLEXERIL) 10 MG tablet Take 1 tablet (10 mg total) by mouth 2 (two) times daily as needed for muscle spasms. 20 tablet 0   . ibuprofen (ADVIL,MOTRIN) 800 MG tablet Take 1 tablet (800 mg total) by mouth 3 (three) times daily. 21 tablet 0   . naproxen (NAPROSYN) 500 MG tablet Take 1 tablet (500 mg total) by mouth 2 (two) times daily. (Patient not taking: Reported on 02/03/2016) 30 tablet 0 Not Taking at Unknown time    Review of Systems  Constitutional: Negative for chills and fever.  HENT: Negative for congestion and rhinorrhea.   Respiratory: Negative for cough and shortness of breath.   Cardiovascular: Negative for chest pain and palpitations.  Gastrointestinal: Negative for abdominal distention, abdominal pain, constipation, diarrhea, nausea and vomiting.  Genitourinary: Negative for difficulty urinating, dysuria, flank pain and frequency.  Neurological: Negative for dizziness and weakness.   Physical Exam   Blood pressure 125/72, pulse 91, temperature 98.3 F (36.8 C), temperature source Oral, resp. rate 18, weight 245 lb 0.6 oz (111.1  kg), last menstrual period 07/11/2016, SpO2 100 %, unknown if currently breastfeeding.  Physical Exam  Vitals reviewed. Constitutional: She is oriented to person, place, and time. She appears well-developed and well-nourished.  HENT:  Head: Normocephalic and atraumatic.  Cardiovascular: Normal rate and intact distal pulses.   Respiratory: Effort normal. No respiratory distress.  GI: Soft. She exhibits no distension. There is no tenderness. There is no rebound and no guarding.  Musculoskeletal: Normal range of motion. She exhibits no edema.  Neurological: She is alert and oriented to person, place, and time.  Skin: Skin is warm and dry. No erythema.  Psychiatric: She has a normal mood and affect.     MAU Course  Procedures  MDM In MA U patient did undergo pregnancy test which was found to be positive.  She had urinalysis which was entirely negative.  Patient declines pelvic exam stating she is in a monogamous relationship and has had no vaginal discharge or vaginal irritation.  Given that patient has no abdominal pain at this time with palpation or rest and has had no vaginal bleeding do not believe there is need for further evaluation for miscarriage versus ectopic at this time. Patient given strict precautions to return to MAU or the closest emergency department with any vaginal bleeding or abdominal pain. Patient voiced understanding.  Assessment and Plan  #1: Pregnancy patient with pregnancy in the early first trimester. 4 weeks 6 days by LMP. With no abdominal pain or bleeding no need to work up further. Patient instructed to establish with OB for obstetrics care.

## 2016-08-15 ENCOUNTER — Inpatient Hospital Stay (HOSPITAL_COMMUNITY)
Admission: AD | Admit: 2016-08-15 | Discharge: 2016-08-15 | Disposition: A | Discharge status: Home / Self Care | Payer: Medicaid Other | Source: Ambulatory Visit | Attending: Family Medicine | Admitting: Family Medicine

## 2016-08-15 ENCOUNTER — Encounter (HOSPITAL_COMMUNITY): Payer: Self-pay

## 2016-08-15 ENCOUNTER — Inpatient Hospital Stay (HOSPITAL_COMMUNITY): Payer: Medicaid Other

## 2016-08-15 DIAGNOSIS — O3680X0 Pregnancy with inconclusive fetal viability, not applicable or unspecified: Secondary | ICD-10-CM

## 2016-08-15 DIAGNOSIS — N760 Acute vaginitis: Secondary | ICD-10-CM | POA: Diagnosis not present

## 2016-08-15 DIAGNOSIS — O9989 Other specified diseases and conditions complicating pregnancy, childbirth and the puerperium: Secondary | ICD-10-CM | POA: Diagnosis not present

## 2016-08-15 DIAGNOSIS — B9689 Other specified bacterial agents as the cause of diseases classified elsewhere: Secondary | ICD-10-CM | POA: Diagnosis not present

## 2016-08-15 DIAGNOSIS — Z3A01 Less than 8 weeks gestation of pregnancy: Secondary | ICD-10-CM | POA: Diagnosis not present

## 2016-08-15 DIAGNOSIS — R109 Unspecified abdominal pain: Secondary | ICD-10-CM

## 2016-08-15 DIAGNOSIS — O26891 Other specified pregnancy related conditions, first trimester: Secondary | ICD-10-CM | POA: Insufficient documentation

## 2016-08-15 DIAGNOSIS — O26899 Other specified pregnancy related conditions, unspecified trimester: Secondary | ICD-10-CM

## 2016-08-15 DIAGNOSIS — M549 Dorsalgia, unspecified: Secondary | ICD-10-CM | POA: Diagnosis present

## 2016-08-15 HISTORY — DX: Other specified bacterial agents as the cause of diseases classified elsewhere: B96.89

## 2016-08-15 HISTORY — DX: Acute vaginitis: N76.0

## 2016-08-15 LAB — COMPREHENSIVE METABOLIC PANEL
ALBUMIN: 3.2 g/dL — AB (ref 3.5–5.0)
ALK PHOS: 82 U/L (ref 38–126)
ALT: 13 U/L — ABNORMAL LOW (ref 14–54)
AST: 15 U/L (ref 15–41)
Anion gap: 7 (ref 5–15)
BILIRUBIN TOTAL: 0.3 mg/dL (ref 0.3–1.2)
BUN: 12 mg/dL (ref 6–20)
CALCIUM: 8.8 mg/dL — AB (ref 8.9–10.3)
CO2: 24 mmol/L (ref 22–32)
Chloride: 103 mmol/L (ref 101–111)
Creatinine, Ser: 0.89 mg/dL (ref 0.44–1.00)
GFR calc Af Amer: >60 mL/min (ref >60)
GLUCOSE: 79 mg/dL (ref 65–99)
Potassium: 3.7 mmol/L (ref 3.5–5.1)
Sodium: 134 mmol/L — ABNORMAL LOW (ref 135–145)
TOTAL PROTEIN: 7.4 g/dL (ref 6.5–8.1)

## 2016-08-15 LAB — URINALYSIS, MICROSCOPIC (REFLEX): RBC / HPF: NONE SEEN RBC/hpf (ref 0–5)

## 2016-08-15 LAB — WET PREP, GENITAL
Sperm: NONE SEEN
Trich, Wet Prep: NONE SEEN
YEAST WET PREP: NONE SEEN

## 2016-08-15 LAB — URINALYSIS, ROUTINE W REFLEX MICROSCOPIC
Bilirubin Urine: NEGATIVE
GLUCOSE, UA: NEGATIVE mg/dL
Hgb urine dipstick: NEGATIVE
KETONES UR: NEGATIVE mg/dL
LEUKOCYTES UA: NEGATIVE
Nitrite: POSITIVE — AB
PROTEIN: NEGATIVE mg/dL
Specific Gravity, Urine: 1.02 (ref 1.005–1.030)
pH: 5.5 (ref 5.0–8.0)

## 2016-08-15 LAB — HCG, QUANTITATIVE, PREGNANCY: hCG, Beta Chain, Quant, S: 144 m[IU]/mL — ABNORMAL HIGH (ref <5)

## 2016-08-15 MED ORDER — METRONIDAZOLE 0.75 % VA GEL: 1.0000 | Freq: Every day | VAGINAL | 0 refills | Status: AC

## 2016-08-15 NOTE — MAU Provider Note (Signed)
History     CSN: [id number]  Arrival date and time: 08/15/16 4098   First Provider Initiated Contact with Patient 08/15/16 1010      Chief Complaint  Patient presents with  . Abdominal Pain   HPI  [name] is a 26 yo [phone] at 5.[redacted] wks gestation by LMP presenting with complaints of back and butt pain since ~ 0745.  She reports menstrual-like cramping that began a few days ago.  She rates that pain a 9/10.  She denies vomiting.  She was seen yesterday in MAU for the same complaint, but reports the pain is "worse".  She feels she has a UTI; despite having no urinary frequency, urgency or dysuria.  She reports dark, smelly urine. She denies vaginal discharge, vaginal bleeding or back pain.   Past Medical History:  Diagnosis Date  . Headache(784.0)   . Hypertension   . Preterm labor   . Urinary tract infection     Past Surgical History:  Procedure Laterality Date  . BREAST FIBROADENOMA SURGERY  2006-7.   Removal;right  . CESAREAN SECTION    . DILATION AND CURETTAGE OF UTERUS    . THERAPEUTIC ABORTION      Family History  Problem Relation Age of Onset  . Diabetes Mother   . Diabetes Maternal Grandmother   . Hypertension Maternal Grandmother   . Cancer Maternal Grandmother     colon cancer, brain tumour  . Other Neg Hx     Social History  Substance Use Topics  . Smoking status: Current Some Day Smoker    Packs/day: 0.25    Types: Cigarettes  . Smokeless tobacco: Never Used  . Alcohol use No    Allergies: No Known Allergies  Prescriptions Prior to Admission  Medication Sig Dispense Refill Last Dose  . Prenatal Vit-Fe Fumarate-FA (PRENATAL VITAMIN) 27-0.8 MG TABS Take 1 tablet by mouth daily. 30 tablet 3 08/14/2016 at Unknown time    Review of Systems  Constitutional: Negative.   HENT: Negative.   Eyes: Negative.   Respiratory: Negative.   Cardiovascular: Negative.   Gastrointestinal: Positive for abdominal pain (cramping).  Endocrine: Negative.    Genitourinary: Positive for pelvic pain (L>R). Negative for vaginal bleeding and vaginal discharge.  Musculoskeletal: Negative.   Skin: Negative.   Allergic/Immunologic: Negative.   Neurological: Negative.   Hematological: Negative.   Psychiatric/Behavioral: Negative.    Physical Exam   Last menstrual period 07/11/2016. Patient Vitals for the past 24 hrs:  BP Temp Temp src Pulse Resp  08/15/16 0955 115/72 98.3 F (36.8 C) Oral 85 18    Physical Exam  Constitutional: She is oriented to person, place, and time. She appears well-developed and well-nourished.  obese  HENT:  Head: Normocephalic.  Eyes: Pupils are equal, round, and reactive to light.  Neck: Normal range of motion.  Cardiovascular: Normal rate, regular rhythm, normal heart sounds and intact distal pulses.   Respiratory: Effort normal and breath sounds normal.  GI: Soft. Bowel sounds are normal.  Genitourinary:  Genitourinary Comments: Uterus slightly enlarged, non-tender; LT adnexal pain, RT adnexal tenderness, cx: closed/long/firm/posterior, no CMT or friability  Musculoskeletal: Normal range of motion.  Neurological: She is alert and oriented to person, place, and time. She has normal reflexes.  Skin: Skin is warm and dry.  Psychiatric: She has a normal mood and affect. Her behavior is normal. Judgment and thought content normal.   Results for orders placed or performed during the hospital encounter of 08/15/16 (from the past  24 hour(s))  Urinalysis, Routine w reflex microscopic     Status: Abnormal   Collection Time: 08/15/16  9:45 AM  Result Value Ref Range   Color, Urine YELLOW YELLOW   APPearance CLOUDY (A) CLEAR   Specific Gravity, Urine 1.020 1.005 - 1.030   pH 5.5 5.0 - 8.0   Glucose, UA NEGATIVE NEGATIVE mg/dL   Hgb urine dipstick NEGATIVE NEGATIVE   Bilirubin Urine NEGATIVE NEGATIVE   Ketones, ur NEGATIVE NEGATIVE mg/dL   Protein, ur NEGATIVE NEGATIVE mg/dL   Nitrite POSITIVE (A) NEGATIVE    Leukocytes, UA NEGATIVE NEGATIVE  Urinalysis, Microscopic (reflex)     Status: Abnormal   Collection Time: 08/15/16  9:45 AM  Result Value Ref Range   RBC / HPF NONE SEEN 0 - 5 RBC/hpf   WBC, UA 0-5 0 - 5 WBC/hpf   Bacteria, UA FEW (A) NONE SEEN   Squamous Epithelial / LPF 0-5 (A) NONE SEEN  Wet prep, genital     Status: Abnormal   Collection Time: 08/15/16 10:25 AM  Result Value Ref Range   Yeast Wet Prep HPF POC NONE SEEN NONE SEEN   Trich, Wet Prep NONE SEEN NONE SEEN   Clue Cells Wet Prep HPF POC PRESENT (A) NONE SEEN   WBC, Wet Prep HPF POC FEW (A) NONE SEEN   Sperm NONE SEEN   Comprehensive metabolic panel     Status: Abnormal   Collection Time: 08/15/16 10:25 AM  Result Value Ref Range   Sodium 134 (L) 135 - 145 mmol/L   Potassium 3.7 3.5 - 5.1 mmol/L   Chloride 103 101 - 111 mmol/L   CO2 24 22 - 32 mmol/L   Glucose, Bld 79 65 - 99 mg/dL   BUN 12 6 - 20 mg/dL   Creatinine, Ser 1.61 0.44 - 1.00 mg/dL   Calcium 8.8 (L) 8.9 - 10.3 mg/dL   Total Protein 7.4 6.5 - 8.1 g/dL   Albumin 3.2 (L) 3.5 - 5.0 g/dL   AST 15 15 - 41 U/L   ALT 13 (L) 14 - 54 U/L   Alkaline Phosphatase 82 38 - 126 U/L   Total Bilirubin 0.3 0.3 - 1.2 mg/dL   GFR calc non Af Amer >60 >60 mL/min   GFR calc Af Amer >60 >60 mL/min   Anion gap 7 5 - 15   US Ob Comp Less 14 Wks  Result Date: 08/15/2016 CLINICAL DATA:  Abdominal pain. EXAM: OBSTETRIC <14 WK [place] AND TRANSVAGINAL OB US TECHNIQUE: Both transabdominal and transvaginal ultrasound examinations were performed for complete evaluation of the gestation as well as the maternal uterus, adnexal regions, and pelvic cul-de-sac. Transvaginal technique was performed to assess early pregnancy. COMPARISON:  09/27/2015 FINDINGS: Intrauterine gestational sac: None Yolk sac:  Not Visualized. Embryo:  Not Visualized. Maternal uterus/adnexae: Right ovary: Appears normal. Left ovary: Appears normal. Other :Within bilateral adnexal region there are 2 solid-appearing,  indeterminate echogenic lobular appearing structures structures which appears separate from the ovaries. These do not have the typical appearance of an ectopic pregnancy and may represent fluid surrounding lobules of fat. Free fluid: Moderate simple appearing free fluid noted within the pelvis. IMPRESSION: 1. No intrauterine gestational sac, yolk sac, or fetal pole identified. Differential considerations include intrauterine pregnancy too early to be sonographically visualized, missed abortion, or ectopic pregnancy. Followup ultrasound is recommended in 10-14 days for further evaluation. 2. Moderate simple appearing free fluid noted within the pelvis. 3. As described above there are 2 solid-appearing indeterminate  echogenic lobular structures which appears separate from the ovaries but do not have the typical appearance of ectopic pregnancy. Suggest followup imaging in 6-12 weeks. Electronically Signed   By: Signa Kellaylor  Stroud M.D.   On: 08/15/2016 12:08   [place] Ob Transvaginal  Result Date: 08/15/2016 CLINICAL DATA:  Abdominal pain. EXAM: OBSTETRIC <14 WK US AND TRANSVAGINAL OB US TECHNIQUE: Both transabdominal and transvaginal ultrasound examinations were performed for complete evaluation of the gestation as well as the maternal uterus, adnexal regions, and pelvic cul-de-sac. Transvaginal technique was performed to assess early pregnancy. COMPARISON:  09/27/2015 FINDINGS: Intrauterine gestational sac: None Yolk sac:  Not Visualized. Embryo:  Not Visualized. Maternal uterus/adnexae: Right ovary: Appears normal. Left ovary: Appears normal. Other :Within bilateral adnexal region there are 2 solid-appearing, indeterminate echogenic lobular appearing structures structures which appears separate from the ovaries. These do not have the typical appearance of an ectopic pregnancy and may represent fluid surrounding lobules of fat. Free fluid: Moderate simple appearing free fluid noted within the pelvis. IMPRESSION: 1. No  intrauterine gestational sac, yolk sac, or fetal pole identified. Differential considerations include intrauterine pregnancy too early to be sonographically visualized, missed abortion, or ectopic pregnancy. Followup ultrasound is recommended in 10-14 days for further evaluation. 2. Moderate simple appearing free fluid noted within the pelvis. 3. As described above there are 2 solid-appearing indeterminate echogenic lobular structures which appears separate from the ovaries but do not have the typical appearance of ectopic pregnancy. Suggest followup imaging in 6-12 weeks. Electronically Signed   By: Signa Kellaylor  Stroud M.D.   On: 08/15/2016 12:08   MAU Course  Procedures  MDM CCUA Wet Prep GC/CT CMP HIV Beta HCG U/S OB <14 wks  U/S OB Transvaginal Metrogel Rx Repeat BHCG in 48 hrs  Assessment and Plan  26 yo [id number] at 5.[redacted] wks gestation Abdominal Pain in Pregnancy Bacterial Vaginitis  Discharge Home Rx Metrogel 0.75% 1 applicatorful hs x 5 days Go to [facility] Friday 08/17/2016 for repeat BHCG at 0800 Discussed NOT being diagnosed with ectopic pregnancy, but instructions are included for that because we cannot rule it out at this time.  Please return to Maternity Admissions Unit, if you start to have any of the signs and symptoms included on the ectopic pregnancy instructions. Patient verbalized an understanding of the plan of care and agrees.   Raelyn Moraolitta Brettney Ficken MSN, CNM 08/15/2016, 10:15 AM

## 2016-08-15 NOTE — MAU Note (Signed)
Pt was seen yesterday for abdominal pain worsening today 8/10. No bleeding or discharge.

## 2016-08-15 NOTE — Discharge Instructions (Signed)
You are NOT being diagnosed with ectopic pregnancy, but instructions are included for that because we cannot rule it out at this time.  Please return to Maternity Admissions Unit, if you start to have any of the signs and symptoms included on the ectopic pregnancy instructions.

## 2016-08-16 LAB — GC/CHLAMYDIA PROBE AMP (~~LOC~~) NOT AT ARMC
CHLAMYDIA, DNA PROBE: NEGATIVE
Neisseria Gonorrhea: NEGATIVE

## 2016-08-16 LAB — HIV ANTIBODY (ROUTINE TESTING W REFLEX): HIV Screen 4th Generation wRfx: NONREACTIVE

## 2016-08-17 ENCOUNTER — Telehealth: Payer: Self-pay

## 2016-08-17 ENCOUNTER — Ambulatory Visit: Payer: Medicaid Other

## 2016-08-17 NOTE — Telephone Encounter (Signed)
Patient was schedule for BHCG this am. She did not keep her appointment. Called patient no answer or voicemail to leave message.

## 2016-08-20 ENCOUNTER — Ambulatory Visit: Payer: Medicaid Other

## 2016-08-20 ENCOUNTER — Encounter: Payer: Self-pay | Admitting: *Deleted

## 2016-08-20 ENCOUNTER — Telehealth: Payer: Self-pay | Admitting: *Deleted

## 2016-08-20 NOTE — Telephone Encounter (Signed)
[facility]  Pt was made aware that she needs to have lab work done as ordered, needs repeat HCG. Pt has been scheduled for 08/21/16. Pt made aware that by LMP she is approx 5 weeks and some days. Pt made aware that it may be to early to see pregnancy on u/s, nothing was seen on u/s from last week-MAU visit. Pt made aware that important to follow up in order to determine pregnancy, viable/ectopic/sab. Pt states that she did call office to make NOB appt and is waiting a call back to schedule.  Pt states that she is a high risk pt and is concerned she may need a "stitch" for this pregnancy. Pt advised that she should get call to set up appt and can discuss at that time to determine how early she can be scheduled. Pt made aware that message can be sent to provider and scheduler to make them aware of her concerns.  Pt states understanding.

## 2016-08-21 ENCOUNTER — Other Ambulatory Visit: Payer: Medicaid Other

## 2016-08-21 DIAGNOSIS — Z3490 Encounter for supervision of normal pregnancy, unspecified, unspecified trimester: Secondary | ICD-10-CM

## 2016-08-21 NOTE — Telephone Encounter (Signed)
Do not give NOB appointment until viable pregnancy identified. Repeat Quants. [facility]

## 2016-08-22 ENCOUNTER — Other Ambulatory Visit: Payer: Self-pay | Admitting: *Deleted

## 2016-08-22 ENCOUNTER — Telehealth: Payer: Self-pay | Admitting: *Deleted

## 2016-08-22 DIAGNOSIS — O3680X0 Pregnancy with inconclusive fetal viability, not applicable or unspecified: Secondary | ICD-10-CM

## 2016-08-22 LAB — BETA HCG QUANT (REF LAB): HCG QUANT: 1687 m[IU]/mL

## 2016-08-22 NOTE — Progress Notes (Signed)
U/s orders per Dr Clearance [name]. See Telephone notes.

## 2016-08-22 NOTE — Telephone Encounter (Signed)
Pt called to office for results of Hcg test.  Results were reviewed with Dr Clearance [name] for recommendations. Dr Clearance [name] would like u/s in one week for dating/viability. Pt made aware of recommendations and orders will be entered.  Pt made aware she should receive call regarding u/s appt once it is scheduled.

## 2016-08-23 ENCOUNTER — Inpatient Hospital Stay (HOSPITAL_COMMUNITY): Payer: Medicaid Other

## 2016-08-23 ENCOUNTER — Encounter (HOSPITAL_COMMUNITY): Payer: Self-pay | Admitting: Student

## 2016-08-23 ENCOUNTER — Inpatient Hospital Stay (HOSPITAL_COMMUNITY)
Admission: AD | Admit: 2016-08-23 | Discharge: 2016-08-23 | Disposition: A | Discharge status: Home / Self Care | Payer: Medicaid Other | Source: Ambulatory Visit | Attending: Obstetrics & Gynecology | Admitting: Obstetrics & Gynecology

## 2016-08-23 DIAGNOSIS — O9989 Other specified diseases and conditions complicating pregnancy, childbirth and the puerperium: Secondary | ICD-10-CM

## 2016-08-23 DIAGNOSIS — F17210 Nicotine dependence, cigarettes, uncomplicated: Secondary | ICD-10-CM | POA: Insufficient documentation

## 2016-08-23 DIAGNOSIS — N9489 Other specified conditions associated with female genital organs and menstrual cycle: Secondary | ICD-10-CM

## 2016-08-23 DIAGNOSIS — Z3491 Encounter for supervision of normal pregnancy, unspecified, first trimester: Secondary | ICD-10-CM

## 2016-08-23 DIAGNOSIS — O99331 Smoking (tobacco) complicating pregnancy, first trimester: Secondary | ICD-10-CM | POA: Insufficient documentation

## 2016-08-23 DIAGNOSIS — R109 Unspecified abdominal pain: Secondary | ICD-10-CM | POA: Insufficient documentation

## 2016-08-23 DIAGNOSIS — O26899 Other specified pregnancy related conditions, unspecified trimester: Secondary | ICD-10-CM

## 2016-08-23 DIAGNOSIS — O26891 Other specified pregnancy related conditions, first trimester: Secondary | ICD-10-CM | POA: Diagnosis not present

## 2016-08-23 DIAGNOSIS — Z3A01 Less than 8 weeks gestation of pregnancy: Secondary | ICD-10-CM | POA: Diagnosis not present

## 2016-08-23 LAB — URINALYSIS, ROUTINE W REFLEX MICROSCOPIC
BILIRUBIN URINE: NEGATIVE
GLUCOSE, UA: NEGATIVE mg/dL
HGB URINE DIPSTICK: NEGATIVE
KETONES UR: NEGATIVE mg/dL
NITRITE: NEGATIVE
Protein, ur: NEGATIVE mg/dL
Specific Gravity, Urine: 1.016 (ref 1.005–1.030)
pH: 5 (ref 5.0–8.0)

## 2016-08-23 LAB — HCG, QUANTITATIVE, PREGNANCY: hCG, Beta Chain, Quant, S: 4469 m[IU]/mL — ABNORMAL HIGH (ref <5)

## 2016-08-23 LAB — CBC
HEMATOCRIT: 33.6 % — AB (ref 36.0–46.0)
HEMOGLOBIN: 11.3 g/dL — AB (ref 12.0–15.0)
MCH: 29.3 pg (ref 26.0–34.0)
MCHC: 33.6 g/dL (ref 30.0–36.0)
MCV: 87 fL (ref 78.0–100.0)
Platelets: 303 10*3/uL (ref 150–400)
RBC: 3.86 MIL/uL — AB (ref 3.87–5.11)
RDW: 13.4 % (ref 11.5–15.5)
WBC: 7.7 10*3/uL (ref 4.0–10.5)

## 2016-08-23 NOTE — Discharge Instructions (Signed)
Return to care  °· If you have heavier bleeding that soaks through more that 2 pads per hour for an hour or more °· If you bleed so much that you feel like you might pass out or you do pass out °· If you have significant abdominal pain that is not improved with Tylenol  °· If you develop a fever > 100.5 ° °

## 2016-08-23 NOTE — Progress Notes (Addendum)
[id number] @ approx [redacted] wksga. Presents to triage for same issue a week ago. Cultures done. Pt states was given px for PNV and vaginal cream. Pt states no vaginal cream to be picked up from pharmacy. PNV obtained. Denies bleeding. abd pain started last night at about 1930. States kids at home asleep and [facility]   Hx of C/S dt PIH and triplets. Pt unsure of gestational age except infants in NICU for 2 wks. Missed Abortion X3 and 1 tAB  1150: Provider at bs assessing pt. U/s ordered.   1220: to U/S  1305: back from U/S

## 2016-08-23 NOTE — MAU Provider Note (Signed)
History     CSN: [id number]  Arrival date and time: 08/23/16 1046  First Provider Initiated Contact with Patient 08/23/16 1121      Chief Complaint  Patient presents with  . Abdominal Pain   HPI  [name] is a 26 y.o. [phone] at [redacted]w[redacted]d who presents with abdominal pain. Symptoms began last night. Reports intermittent generalized abdominal pain that is worse across her lower abdomen. Describes as sharp & cramp like. Rates pain 9/10. Has not treated. Nothing makes better or worse. Endorses nausea, no vomiting. Denies diarrhea, constipation, vaginal bleeding, vaginal discharge, or fever/chills.    OB History    Gravida Para Term Preterm AB Living   6 1   1 4 2    SAB TAB Ectopic Multiple Live Births   3 1   1 2       Past Medical History:  Diagnosis Date  . Bacterial vaginosis 08/15/2016  . Headache(784.0)   . Hypertension   . Preterm labor   . Urinary tract infection     Past Surgical History:  Procedure Laterality Date  . BREAST FIBROADENOMA SURGERY  2006-7.   Removal;right  . CESAREAN SECTION    . DILATION AND CURETTAGE OF UTERUS    . THERAPEUTIC ABORTION      Family History  Problem Relation Age of Onset  . Diabetes Mother   . Diabetes Maternal Grandmother   . Hypertension Maternal Grandmother   . Cancer Maternal Grandmother        colon cancer, brain tumour  . Other Neg Hx     Social History  Substance Use Topics  . Smoking status: Current Some Day Smoker    Packs/day: 0.25    Types: Cigarettes  . Smokeless tobacco: Never Used  . Alcohol use No    Allergies: No Known Allergies  Prescriptions Prior to Admission  Medication Sig Dispense Refill Last Dose  . Prenatal Vit-Fe Fumarate-FA (PRENATAL VITAMIN) 27-0.8 MG TABS Take 1 tablet by mouth daily. 30 tablet 3 08/14/2016 at Unknown time    Review of Systems  Constitutional: Negative.   Gastrointestinal: Positive for abdominal pain and nausea. Negative for constipation, diarrhea and vomiting.   Genitourinary: Negative.    Physical Exam   Blood pressure 123/70, pulse 81, temperature 99.1 F (37.3 C), temperature source Oral, resp. rate 18, height 5\' 4"  (1.626 m), weight 240 lb (108.9 kg), last menstrual period 07/11/2016, unknown if currently breastfeeding.  Physical Exam  Nursing note and vitals reviewed. Constitutional: She is oriented to person, place, and time. She appears well-developed and well-nourished. No distress.  HENT:  Head: Normocephalic and atraumatic.  Eyes: Conjunctivae are normal. Right eye exhibits no discharge. Left eye exhibits no discharge. No scleral icterus.  Neck: Normal range of motion.  Cardiovascular:  No murmur heard. Respiratory: Effort normal. No respiratory distress.  GI: Soft. She exhibits no distension. There is generalized tenderness (worse in RLQ). There is no rebound and no guarding.  Neurological: She is alert and oriented to person, place, and time.  Skin: Skin is warm and dry. She is not diaphoretic.  Psychiatric: She has a normal mood and affect. Her behavior is normal. Judgment and thought content normal.    MAU Course  Procedures Results for orders placed or performed during the hospital encounter of 08/23/16 (from the past 24 hour(s))  Urinalysis, Routine w reflex microscopic     Status: Abnormal   Collection Time: 08/23/16 11:10 AM  Result Value Ref Range   Color, Urine YELLOW  YELLOW   APPearance HAZY (A) CLEAR   Specific Gravity, Urine 1.016 1.005 - 1.030   pH 5.0 5.0 - 8.0   Glucose, UA NEGATIVE NEGATIVE mg/dL   Hgb urine dipstick NEGATIVE NEGATIVE   Bilirubin Urine NEGATIVE NEGATIVE   Ketones, ur NEGATIVE NEGATIVE mg/dL   Protein, ur NEGATIVE NEGATIVE mg/dL   Nitrite NEGATIVE NEGATIVE   Leukocytes, UA SMALL (A) NEGATIVE   RBC / HPF 0-5 0 - 5 RBC/hpf   WBC, UA 6-30 0 - 5 WBC/hpf   Bacteria, UA FEW (A) NONE SEEN   Squamous Epithelial / LPF 0-5 (A) NONE SEEN   Mucous PRESENT   CBC     Status: Abnormal   Collection  Time: 08/23/16 11:36 AM  Result Value Ref Range   WBC 7.7 4.0 - 10.5 K/uL   RBC 3.86 (L) 3.87 - 5.11 MIL/uL   Hemoglobin 11.3 (L) 12.0 - 15.0 g/dL   HCT 40.933.6 (L) 81.136.0 - 91.446.0 %   MCV 87.0 78.0 - 100.0 fL   MCH 29.3 26.0 - 34.0 pg   MCHC 33.6 30.0 - 36.0 g/dL   RDW 78.213.4 95.611.5 - 21.315.5 %   Platelets 303 150 - 400 K/uL  hCG, quantitative, pregnancy     Status: Abnormal   Collection Time: 08/23/16 11:36 AM  Result Value Ref Range   hCG, Beta Chain, Quant, S 4,469 (H) <5 mIU/mL   [place] Ob Transvaginal  Result Date: 08/23/2016 CLINICAL DATA:  Right pelvic pain started at 7:30 p.m. last night. Quantitative beta HCG today is 4469. LMP was 07/11/2016. Gestational age by LMP is 6 weeks 1 day. EXAM: TRANSVAGINAL OB ULTRASOUND TECHNIQUE: Transvaginal ultrasound was performed for complete evaluation of the gestation as well as the maternal uterus, adnexal regions, and pelvic cul-de-sac. COMPARISON:  08/15/2016 FINDINGS: Intrauterine gestational sac: Present Yolk sac:  Present Embryo:  Not seen Cardiac Activity: Not seen Heart Rate: Abscess bpm MSD: 6.8  mm   5 w   2  d Subchorionic hemorrhage:  None visualized. Maternal uterus/adnexae: The left ovary has a normal appearance. Along the medial and inferior aspect of the right ovary, there is a round mass with anechoic center measuring 1.9 x 1.8 x 1.7 cm. It is difficult to determine if this is a portion of the ovary or adjacent to it. Considerations include a corpus luteum cyst or ectopic pregnancy. When compared with the prior study, corpus luteum is favored. Small amount of free pelvic fluid. IMPRESSION: 1. Intrauterine gestational sac and yolk sac without visible fetal pole. 2. Right adnexal mass, favored to represent a corpus luteum cyst. However, ectopic pregnancy adjacent to the ovary is not excluded. Serial quantitative beta HCG values and follow-up ultrasound are recommended as appropriate to document progression of and location of pregnancy. 3. Critical  Value/emergent results were called by telephone at the time of interpretation on 08/23/2016 at 1:36 pm to Dr. Judeth HornERIN Donnivan Villena , who verbally acknowledged these results. Electronically Signed   By: Norva PavlovElizabeth  Brown M.D.   On: 08/23/2016 13:36    MDM CBC, HCG, ultrasound Appropriate rise in BHCG since 2 days ago -- [id number] Ultrasound today shows IUGS with yolk sac, small amount of free fluid; right adnexal mass, likely CLC but can't r/o heterotopic C/w Dr. Alysia PennaErvin who reviewed ultrasound report. Patient has ultrasound scheduled for next Wednesday. Ok to discharge home to keep repeat ultrasound. Serial BHCGs not indicated Pt's pain improved in MAU without intervention Assessment and Plan  A: 1. Normal IUP (intrauterine pregnancy)  on prenatal ultrasound, first trimester   2. Abdominal pain affecting pregnancy   3. Adnexal mass    P: Discharge home Keep scheduled ultrasound next Wednesday Discussed reasons to return to MAU   Judeth Horn 08/23/2016, 11:21 AM

## 2016-08-23 NOTE — MAU Note (Signed)
Pt reports a sharp in her stomach that  Radiated to her back. Pain comes and goes. Denies any vaginal bleeding  But has had some discharge in her underwear.

## 2016-08-25 LAB — CULTURE, OB URINE: Culture: >=100000 — AB

## 2016-08-26 ENCOUNTER — Telehealth: Payer: Self-pay | Admitting: Student

## 2016-08-26 ENCOUNTER — Other Ambulatory Visit: Payer: Self-pay | Admitting: Student

## 2016-08-26 MED ORDER — CEPHALEXIN 500 MG PO CAPS: 500.0000 mg | ORAL_CAPSULE | Freq: Four times a day (QID) | ORAL | 0 refills | Status: AC

## 2016-08-26 NOTE — Telephone Encounter (Signed)
Message sent to [place] [facility] to call her with her UTI results and that RX is waiting for her at the pharmacy.

## 2016-08-26 NOTE — Telephone Encounter (Signed)
Left message for patient to call MAU (gave her both provider line and nursing station line) re need for treatment for UTI. Will call again at the end of the day if she does not call back today.  Luna KitchensKathryn Ivaan Liddy CNM

## 2016-08-26 NOTE — Telephone Encounter (Signed)
Attempted to reach patient again; left vm asking her to call MAU regarding her urine culture results.

## 2016-08-27 ENCOUNTER — Telehealth: Payer: Self-pay | Admitting: *Deleted

## 2016-08-27 NOTE — Telephone Encounter (Signed)
Patient notified

## 2016-08-27 NOTE — Telephone Encounter (Signed)
-----   Message from Marylene LandKathryn Lorraine Kooistra, CNM sent at 08/26/2016  8:18 PM EDT ----- Hello! I have tried to reach this patient two times. Do you mind calling her to tell her that she has a UTI and that a prescription is waiting for her at the pharmacy? Thank you!  Luna KitchensKathryn Kooistra

## 2016-08-29 ENCOUNTER — Ambulatory Visit (HOSPITAL_COMMUNITY)
Admission: RE | Admit: 2016-08-29 | Discharge: 2016-08-29 | Disposition: A | Discharge status: Home / Self Care | Payer: Medicaid Other | Source: Ambulatory Visit | Attending: Obstetrics | Admitting: Obstetrics

## 2016-08-29 ENCOUNTER — Ambulatory Visit: Payer: Medicaid Other

## 2016-08-29 DIAGNOSIS — Z3A01 Less than 8 weeks gestation of pregnancy: Secondary | ICD-10-CM | POA: Insufficient documentation

## 2016-08-29 DIAGNOSIS — O208 Other hemorrhage in early pregnancy: Secondary | ICD-10-CM | POA: Diagnosis not present

## 2016-08-29 DIAGNOSIS — O3680X0 Pregnancy with inconclusive fetal viability, not applicable or unspecified: Secondary | ICD-10-CM

## 2016-08-29 NOTE — Progress Notes (Signed)
Patient presented to office today for Ultrasound results. It has been confirmed patient does have viable   Pregnancy at this time.Result state to have another ultrasound in 14 days due to fetal heart rate being low. Patient has been schedule at this time for a repeat ultrasound [facility] 09[facility]19[facility]2016 [facility]8 [facility]. Patient verbalizes understanding at this time.

## 2016-08-30 ENCOUNTER — Telehealth: Payer: Self-pay

## 2016-08-30 NOTE — Telephone Encounter (Signed)
Pt called to verify her most recent u/s dating. Verified most recent u/s dating with pt, and that is does not agree with original dating. Encouraged pt to follow up in 2 weeks for next u/s

## 2016-09-03 ENCOUNTER — Inpatient Hospital Stay (HOSPITAL_COMMUNITY)
Admission: AD | Admit: 2016-09-03 | Discharge: 2016-09-03 | Disposition: A | Discharge status: Home / Self Care | Payer: Medicaid Other | Source: Ambulatory Visit | Attending: Obstetrics and Gynecology | Admitting: Obstetrics and Gynecology

## 2016-09-03 DIAGNOSIS — Z3A01 Less than 8 weeks gestation of pregnancy: Secondary | ICD-10-CM | POA: Insufficient documentation

## 2016-09-03 DIAGNOSIS — O26891 Other specified pregnancy related conditions, first trimester: Secondary | ICD-10-CM | POA: Diagnosis not present

## 2016-09-03 DIAGNOSIS — F17210 Nicotine dependence, cigarettes, uncomplicated: Secondary | ICD-10-CM | POA: Insufficient documentation

## 2016-09-03 DIAGNOSIS — Z79899 Other long term (current) drug therapy: Secondary | ICD-10-CM | POA: Insufficient documentation

## 2016-09-03 DIAGNOSIS — O99331 Smoking (tobacco) complicating pregnancy, first trimester: Secondary | ICD-10-CM | POA: Diagnosis not present

## 2016-09-03 DIAGNOSIS — O26899 Other specified pregnancy related conditions, unspecified trimester: Secondary | ICD-10-CM

## 2016-09-03 DIAGNOSIS — O10011 Pre-existing essential hypertension complicating pregnancy, first trimester: Secondary | ICD-10-CM | POA: Diagnosis not present

## 2016-09-03 DIAGNOSIS — R109 Unspecified abdominal pain: Secondary | ICD-10-CM | POA: Diagnosis not present

## 2016-09-03 MED ORDER — FAMOTIDINE 20 MG PO TABS: 20.0000 mg | ORAL_TABLET | Freq: Two times a day (BID) | ORAL | 1 refills | Status: DC

## 2016-09-03 NOTE — Discharge Instructions (Signed)
Famotidine tablets or gelcaps What is this medicine? FAMOTIDINE (fa MOE ti deen) is a type of antihistamine that blocks the release of stomach acid. It is used to treat stomach or intestinal ulcers. It can also relieve heartburn from acid reflux. This medicine may be used for other purposes; ask your health care provider or pharmacist if you have questions. COMMON BRAND NAME(S): Heartburn Relief, Pepcid, Pepcid AC, Pepcid AC Maximum Strength What should I tell my health care provider before I take this medicine? They need to know if you have any of these conditions: -kidney or liver disease -trouble swallowing -an unusual or allergic reaction to famotidine, other medicines, foods, dyes, or preservatives -pregnant or trying to get pregnant -breast-feeding How should I use this medicine? Take this medicine by mouth with a glass of water. Follow the directions on the prescription label. If you only take this medicine once a day, take it at bedtime. Take your doses at regular intervals. Do not take your medicine more often than directed. Talk to your pediatrician regarding the use of this medicine in children. Special care may be needed. Overdosage: If you think you have taken too much of this medicine contact a poison control center or emergency room at once. NOTE: This medicine is only for you. Do not share this medicine with others. What if I miss a dose? If you miss a dose, take it as soon as you can. If it is almost time for your next dose, take only that dose. Do not take double or extra doses. What may interact with this medicine? -delavirdine -itraconazole -ketoconazole This list may not describe all possible interactions. Give your health care provider a list of all the medicines, herbs, non-prescription drugs, or dietary supplements you use. Also tell them if you smoke, drink alcohol, or use illegal drugs. Some items may interact with your medicine. What should I watch for while using  this medicine? Tell your doctor or health care professional if your condition does not start to get better or if it gets worse. Finish the full course of tablets prescribed, even if you feel better. Do not take with aspirin, ibuprofen or other antiinflammatory medicines. These can make your condition worse. Do not smoke cigarettes or drink alcohol. These cause irritation in your stomach and can increase the time it will take for ulcers to heal. If you get black, tarry stools or vomit up what looks like coffee grounds, call your doctor or health care professional at once. You may have a bleeding ulcer. What side effects may I notice from receiving this medicine? Side effects that you should report to your doctor or health care professional as soon as possible: -agitation, nervousness -confusion -hallucinations -skin rash, itching Side effects that usually do not require medical attention (report to your doctor or health care professional if they continue or are bothersome): -constipation -diarrhea -dizziness -headache This list may not describe all possible side effects. Call your doctor for medical advice about side effects. You may report side effects to FDA at 1-800-FDA-1088. Where should I keep my medicine? Keep out of the reach of children. Store at room temperature between 15 and 30 degrees C (59 and 86 degrees F). Do not freeze. Throw away any unused medicine after the expiration date. NOTE: This sheet is a summary. It may not cover all possible information. If you have questions about this medicine, talk to your doctor, pharmacist, or health care provider.  2018 Elsevier/Gold Standard (2012-07-16 14:45:49)

## 2016-09-03 NOTE — MAU Note (Signed)
Patient presents with abdominal pain since this morning, denies vaginal bleeding, having a fishy odor with her discharge.

## 2016-09-03 NOTE — MAU Provider Note (Signed)
Patient [name] is a 26 y.o. [phone]  Here with abdominal pain. She was recently treated for a UTI; she is continuing her treatment.   She does not want any cultures done; she has an SIUP at 7 weeks 5 days by early Korea (last Korea was 08-30-2016).  She appears to want an Korea because she keeps asking if the "baby is growing ok" and if her uterus is closed or open. She denies bleeding, leaking of fluid. She says she has some white discharge but she does not want to be examined.     Yesterday patient ate Hibachi shrimp and steak, Wendys and a fruit cup. Today she has eaten nothing except some Ritz crackers. She vomited when she took her prenatal vitamins.  History     CSN: [id number]  Arrival date and time: 09/03/16 1652   None     Chief Complaint  Patient presents with  . Abdominal Pain   Abdominal Pain  This is a chronic problem. The current episode started more than 1 month ago. Episode frequency: worse at night. The problem has been unchanged. The pain is located in the generalized abdominal region. The pain is at a severity of 9/10. The abdominal pain does not radiate. Pertinent negatives include no constipation, frequency, headaches, nausea or vomiting. The pain is aggravated by certain positions. The pain is relieved by nothing. She has tried nothing for the symptoms.    OB History    Gravida Para Term Preterm AB Living   6 1   1 4 2    SAB TAB Ectopic Multiple Live Births   3 1   1 2       Past Medical History:  Diagnosis Date  . Bacterial vaginosis 08/15/2016  . Headache(784.0)   . Hypertension   . Preterm labor   . Urinary tract infection     Past Surgical History:  Procedure Laterality Date  . BREAST FIBROADENOMA SURGERY  2006-7.   Removal;right  . CESAREAN SECTION    . DILATION AND CURETTAGE OF UTERUS    . THERAPEUTIC ABORTION      Family History  Problem Relation Age of Onset  . Diabetes Mother   . Diabetes Maternal Grandmother   . Hypertension Maternal  Grandmother   . Cancer Maternal Grandmother        colon cancer, brain tumour  . Other Neg Hx     Social History  Substance Use Topics  . Smoking status: Current Some Day Smoker    Packs/day: 0.25    Types: Cigarettes  . Smokeless tobacco: Never Used  . Alcohol use No    Allergies: No Known Allergies  Prescriptions Prior to Admission  Medication Sig Dispense Refill Last Dose  . cephALEXin (KEFLEX) 500 MG capsule Take 1 capsule (500 mg total) by mouth 4 (four) times daily. 40 capsule 0   . Prenatal Vit-Fe Fumarate-FA (PRENATAL VITAMIN) 27-0.8 MG TABS Take 1 tablet by mouth daily. 30 tablet 3 08/23/2016 at Unknown time    Review of Systems  Gastrointestinal: Positive for abdominal pain. Negative for constipation, nausea and vomiting.  Genitourinary: Negative for frequency.  Neurological: Negative for headaches.   Physical Exam   Blood pressure 132/74, pulse 97, temperature 98.7 F (37.1 C), temperature source Oral, resp. rate 16, last menstrual period 07/11/2016, unknown if currently breastfeeding.  Physical Exam  Constitutional: She is oriented to person, place, and time. She appears well-developed and well-nourished.  HENT:  Head: Normocephalic.  Neck: Normal range of motion.  Respiratory: Effort normal.  GI: Soft.  Musculoskeletal: Normal range of motion.  Neurological: She is alert and oriented to person, place, and time.  Skin: Skin is warm and dry. She is not diaphoretic. No erythema.  Psychiatric: She has a normal mood and affect.    MAU Course  Procedures  MDM -Patient advised that due to her lack of obstetric complaints and refusal to do cultures, we are limited in what we can offer her today. An US is not warranted.  She is to keep her US appointments in June.   Assessment and Plan   1. Abdominal pain affecting pregnancy    2. Patient stable for discharge with RX for pepcid.  3. All questions answered.   Charlesetta GaribaldiKathryn Lorraine Petronella Shuford 09/03/2016, 5:18 PM

## 2016-09-06 ENCOUNTER — Inpatient Hospital Stay (HOSPITAL_COMMUNITY): Payer: Medicaid Other

## 2016-09-06 ENCOUNTER — Inpatient Hospital Stay (HOSPITAL_COMMUNITY)
Admission: AD | Admit: 2016-09-06 | Discharge: 2016-09-06 | Disposition: A | Discharge status: Home / Self Care | Payer: Medicaid Other | Source: Ambulatory Visit | Attending: Family Medicine | Admitting: Family Medicine

## 2016-09-06 DIAGNOSIS — Z833 Family history of diabetes mellitus: Secondary | ICD-10-CM | POA: Insufficient documentation

## 2016-09-06 DIAGNOSIS — K5900 Constipation, unspecified: Secondary | ICD-10-CM | POA: Diagnosis not present

## 2016-09-06 DIAGNOSIS — O99331 Smoking (tobacco) complicating pregnancy, first trimester: Secondary | ICD-10-CM | POA: Insufficient documentation

## 2016-09-06 DIAGNOSIS — F17210 Nicotine dependence, cigarettes, uncomplicated: Secondary | ICD-10-CM | POA: Insufficient documentation

## 2016-09-06 DIAGNOSIS — N760 Acute vaginitis: Secondary | ICD-10-CM

## 2016-09-06 DIAGNOSIS — O26891 Other specified pregnancy related conditions, first trimester: Secondary | ICD-10-CM | POA: Insufficient documentation

## 2016-09-06 DIAGNOSIS — O99611 Diseases of the digestive system complicating pregnancy, first trimester: Secondary | ICD-10-CM | POA: Insufficient documentation

## 2016-09-06 DIAGNOSIS — R1030 Lower abdominal pain, unspecified: Secondary | ICD-10-CM | POA: Diagnosis present

## 2016-09-06 DIAGNOSIS — B9689 Other specified bacterial agents as the cause of diseases classified elsewhere: Secondary | ICD-10-CM

## 2016-09-06 DIAGNOSIS — M549 Dorsalgia, unspecified: Secondary | ICD-10-CM | POA: Diagnosis not present

## 2016-09-06 DIAGNOSIS — Z3A01 Less than 8 weeks gestation of pregnancy: Secondary | ICD-10-CM | POA: Insufficient documentation

## 2016-09-06 DIAGNOSIS — O26899 Other specified pregnancy related conditions, unspecified trimester: Secondary | ICD-10-CM

## 2016-09-06 DIAGNOSIS — R109 Unspecified abdominal pain: Secondary | ICD-10-CM

## 2016-09-06 LAB — URINALYSIS, ROUTINE W REFLEX MICROSCOPIC
BILIRUBIN URINE: NEGATIVE
Glucose, UA: NEGATIVE mg/dL
Hgb urine dipstick: NEGATIVE
Ketones, ur: NEGATIVE mg/dL
Leukocytes, UA: NEGATIVE
NITRITE: NEGATIVE
PH: 6 (ref 5.0–8.0)
Protein, ur: NEGATIVE mg/dL
SPECIFIC GRAVITY, URINE: 1.017 (ref 1.005–1.030)

## 2016-09-06 NOTE — MAU Note (Signed)
2ND- CALL   NOT IN LOBBY

## 2016-09-06 NOTE — MAU Provider Note (Signed)
History     CSN: [id number]  Arrival date and time: 09/06/16 1729   First Provider Initiated Contact with Patient 09/06/16 1752      Chief Complaint  Patient presents with  . Abdominal Pain   HPI  [name] is a 26 y.o. [phone] at [redacted]w[redacted]d who presents to MAU today with complaint of lower abdominal pain. The patient has been seen for this numerous times already in this pregnancy. She states that the medicine we gave her last time is not helping. She was given Pepcid. She denies vaginal bleeding or discharge or UTI symptoms or fever. She has had multiple previous US and has confirmed IUP. She denies N/V/D but does endorse constipation. Last BM was yesterday and difficult to pass.    OB History    Gravida Para Term Preterm AB Living   6 1   1 4 2    SAB TAB Ectopic Multiple Live Births   3 1   1 2       Past Medical History:  Diagnosis Date  . Bacterial vaginosis 08/15/2016  . Headache(784.0)   . Hypertension   . Preterm labor   . Urinary tract infection     Past Surgical History:  Procedure Laterality Date  . BREAST FIBROADENOMA SURGERY  2006-7.   Removal;right  . CESAREAN SECTION    . DILATION AND CURETTAGE OF UTERUS    . THERAPEUTIC ABORTION      Family History  Problem Relation Age of Onset  . Diabetes Mother   . Diabetes Maternal Grandmother   . Hypertension Maternal Grandmother   . Cancer Maternal Grandmother        colon cancer, brain tumour  . Other Neg Hx     Social History  Substance Use Topics  . Smoking status: Current Some Day Smoker    Packs/day: 0.25    Types: Cigarettes  . Smokeless tobacco: Never Used  . Alcohol use No    Allergies: No Known Allergies  Prescriptions Prior to Admission  Medication Sig Dispense Refill Last Dose  . famotidine (PEPCID) 20 MG tablet Take 1 tablet (20 mg total) by mouth 2 (two) times daily. 60 tablet 1   . Prenatal Vit-Fe Fumarate-FA (PRENATAL VITAMIN) 27-0.8 MG TABS Take 1 tablet by mouth daily. 30 tablet  3 08/23/2016 at Unknown time    Review of Systems  Constitutional: Negative for fever.  Gastrointestinal: Positive for abdominal pain and constipation. Negative for diarrhea, nausea and vomiting.  Genitourinary: Negative for dysuria, frequency, urgency, vaginal bleeding and vaginal discharge.  Musculoskeletal: Positive for back pain.   Physical Exam   Blood pressure 117/67, pulse 100, temperature 98.7 F (37.1 C), temperature source Oral, resp. rate 18, height 5\' 4"  (1.626 m), weight 239 lb (108.4 kg), last menstrual period 07/11/2016, unknown if currently breastfeeding.  Physical Exam  Nursing note and vitals reviewed. Constitutional: She is oriented to person, place, and time. She appears well-developed and well-nourished. No distress.  HENT:  Head: Normocephalic and atraumatic.  Cardiovascular: Normal rate.   Respiratory: Effort normal.  GI: Soft. She exhibits no distension and no mass. There is tenderness (mild lower abdominal tenderness to palpation). There is no rebound and no guarding.  Neurological: She is alert and oriented to person, place, and time.  Skin: Skin is warm and dry. No erythema.  Psychiatric: She has a normal mood and affect.    Results for orders placed or performed during the hospital encounter of 09/06/16 (from the past 24 hour(s))  Urinalysis, Routine w reflex microscopic     Status: Abnormal   Collection Time: 09/06/16  5:58 PM  Result Value Ref Range   Color, Urine YELLOW YELLOW   APPearance HAZY (A) CLEAR   Specific Gravity, Urine 1.017 1.005 - 1.030   pH 6.0 5.0 - 8.0   Glucose, UA NEGATIVE NEGATIVE mg/dL   Hgb urine dipstick NEGATIVE NEGATIVE   Bilirubin Urine NEGATIVE NEGATIVE   Ketones, ur NEGATIVE NEGATIVE mg/dL   Protein, ur NEGATIVE NEGATIVE mg/dL   Nitrite NEGATIVE NEGATIVE   Leukocytes, UA NEGATIVE NEGATIVE    MAU Course  Procedures None  MDM Attempted bedside US and unable to obtain a clear image due to early gestation. Offered  to manage constipation as most likely source of pain and would re-assess with US if patient desired. Patient requests US. US ordered and patient returned to the lobby to wait for her turn in radiology.  Sonographer came to MAU to get the patient and she was no longer in the lobby.  Assessment and Plan  A: SIUP at 5475w6d Abdominal pain in pregnancy, first trimester Back pain in pregnancy Constipation  P: Patient left without completing visit today  Vonzella NippleJulie Suellyn Meenan. PA-C 09/06/2016, 7:55 PM

## 2016-09-06 NOTE — MAU Note (Signed)
NOT IN LOBBY 

## 2016-09-06 NOTE — MAU Note (Signed)
C/o back pain and abd pain. Was given pain medication but is not working. Was seen in MAU 2 days ago for the same complaint.

## 2016-09-07 ENCOUNTER — Encounter (HOSPITAL_COMMUNITY): Payer: Self-pay | Admitting: *Deleted

## 2016-09-07 ENCOUNTER — Inpatient Hospital Stay (HOSPITAL_COMMUNITY): Payer: Medicaid Other

## 2016-09-07 ENCOUNTER — Inpatient Hospital Stay (HOSPITAL_COMMUNITY)
Admission: AD | Admit: 2016-09-07 | Discharge: 2016-09-07 | Disposition: A | Discharge status: Home / Self Care | Payer: Medicaid Other | Source: Ambulatory Visit | Attending: Obstetrics & Gynecology | Admitting: Obstetrics & Gynecology

## 2016-09-07 DIAGNOSIS — O9989 Other specified diseases and conditions complicating pregnancy, childbirth and the puerperium: Secondary | ICD-10-CM | POA: Insufficient documentation

## 2016-09-07 DIAGNOSIS — O99891 Other specified diseases and conditions complicating pregnancy: Secondary | ICD-10-CM

## 2016-09-07 DIAGNOSIS — Z3A01 Less than 8 weeks gestation of pregnancy: Secondary | ICD-10-CM | POA: Insufficient documentation

## 2016-09-07 DIAGNOSIS — Z87891 Personal history of nicotine dependence: Secondary | ICD-10-CM | POA: Diagnosis not present

## 2016-09-07 DIAGNOSIS — M549 Dorsalgia, unspecified: Secondary | ICD-10-CM | POA: Insufficient documentation

## 2016-09-07 DIAGNOSIS — R102 Pelvic and perineal pain: Secondary | ICD-10-CM | POA: Diagnosis not present

## 2016-09-07 DIAGNOSIS — O26891 Other specified pregnancy related conditions, first trimester: Secondary | ICD-10-CM | POA: Insufficient documentation

## 2016-09-07 DIAGNOSIS — O219 Vomiting of pregnancy, unspecified: Secondary | ICD-10-CM | POA: Insufficient documentation

## 2016-09-07 DIAGNOSIS — Z833 Family history of diabetes mellitus: Secondary | ICD-10-CM | POA: Diagnosis not present

## 2016-09-07 LAB — URINALYSIS, ROUTINE W REFLEX MICROSCOPIC
Bilirubin Urine: NEGATIVE
GLUCOSE, UA: NEGATIVE mg/dL
Hgb urine dipstick: NEGATIVE
KETONES UR: NEGATIVE mg/dL
LEUKOCYTES UA: NEGATIVE
Nitrite: NEGATIVE
PROTEIN: NEGATIVE mg/dL
Specific Gravity, Urine: 1.019 (ref 1.005–1.030)
pH: 6 (ref 5.0–8.0)

## 2016-09-07 MED ORDER — PROMETHAZINE HCL 25 MG PO TABS: 25.0000 mg | ORAL_TABLET | Freq: Four times a day (QID) | ORAL | 2 refills | Status: DC | PRN

## 2016-09-07 MED ORDER — CYCLOBENZAPRINE HCL 10 MG PO TABS: 10.0000 mg | ORAL_TABLET | Freq: Three times a day (TID) | ORAL | 2 refills | Status: DC | PRN

## 2016-09-07 NOTE — MAU Note (Signed)
Unable to void, pt returned to lobby

## 2016-09-07 NOTE — MAU Note (Signed)
Urine is in the lab 

## 2016-09-07 NOTE — MAU Provider Note (Signed)
[facility] Attending Note  History     CSN: [id number]  Arrival date & time 09/07/16  1157   First Provider Initiated Contact with Patient 09/07/16 1513      Chief Complaint  Patient presents with  . Back Pain  . Abdominal Pain  . Emesis During Pregnancy    [name] is a 26 y.o. [phone] at [redacted]w[redacted]d who presents to MAU today for evaluation of back pain, abdominal pain and emesis. Was just seen yesterday, left before ultrasound was done. Today, just wants ultrasound and nausea medications; also something for back pain.  Denies any abnormal vaginal bleeding, discharge, fevers, chills, sweats, dysuria, other GI or GU symptoms or other general symptoms.   Obstetric History   G6   P1   T0   P1   A4   L2    SAB3   TAB1   Ectopic0   Multiple1   Live Births2     # Outcome Date GA Lbr Len/2nd Weight Sex Delivery Anes PTL Lv  6 Current           5 SAB 06/08/15 [redacted]w[redacted]d         4 SAB 01/08/12          3A Preterm 05/17/10 [redacted]w[redacted]d   F CS-LTranv Spinal Y LIV     Complications: Preeclampsia  3B Preterm 05/17/10 [redacted]w[redacted]d   M  Spinal Y LIV     Complications: Preeclampsia  2 SAB           1 TAB               Past Medical History:  Diagnosis Date  . Bacterial vaginosis 08/15/2016  . Headache(784.0)   . Hypertension   . Preterm labor   . Urinary tract infection     Past Surgical History:  Procedure Laterality Date  . BREAST FIBROADENOMA SURGERY  2006-7.   Removal;right  . CESAREAN SECTION    . DILATION AND CURETTAGE OF UTERUS    . THERAPEUTIC ABORTION      Family History  Problem Relation Age of Onset  . Diabetes Mother   . Diabetes Maternal Grandmother   . Hypertension Maternal Grandmother   . Cancer Maternal Grandmother        colon cancer, brain tumour  . Other Neg Hx     Social History  Substance Use Topics  . Smoking status: Former Smoker    Packs/day: 0.25    Types: Cigarettes    Quit date: 08/17/2016  . Smokeless tobacco: Never Used  . Alcohol use No     No Known Allergies  Prescriptions Prior to Admission  Medication Sig Dispense Refill Last Dose  . famotidine (PEPCID) 20 MG tablet Take 1 tablet (20 mg total) by mouth 2 (two) times daily. 60 tablet 1 Past Week at Unknown time  . Prenatal Vit-Fe Fumarate-FA (PRENATAL VITAMIN) 27-0.8 MG TABS Take 1 tablet by mouth daily. 30 tablet 3 09/07/2016 at Unknown time     Physical Exam  BP 107/72 (BP Location: Left Arm)   Pulse 89   Temp 98.5 F (36.9 C) (Oral)   Resp 20   Wt 239 lb 12 oz (108.7 kg)   LMP 07/11/2016 (Exact Date)   SpO2 100%   BMI 41.15 kg/m  GENERAL: Well-developed, well-nourished female in no acute distress  SKIN: Warm, dry and without erythema PSYCH: Normal mood and affect HEENT: Normocephalic, atraumatic.   LUNGS: Normal respiratory effort, normal breath sounds HEART: Regular  rate noted ABDOMEN: Soft, nondistended, mild lower abdominal tenderness PELVIC: Deferred EXTREMITIES: No edema, no cyanosis, normal range of movement  MAU Course/MDM  UA and U/S ordered  Labs and Imaging   Results for orders placed or performed during the hospital encounter of 09/07/16 (from the past 24 hour(s))  Urinalysis, Routine w reflex microscopic     Status: None   Collection Time: 09/07/16  1:40 PM  Result Value Ref Range   Color, Urine YELLOW YELLOW   APPearance CLEAR CLEAR   Specific Gravity, Urine 1.019 1.005 - 1.030   pH 6.0 5.0 - 8.0   Glucose, UA NEGATIVE NEGATIVE mg/dL   Hgb urine dipstick NEGATIVE NEGATIVE   Bilirubin Urine NEGATIVE NEGATIVE   Ketones, ur NEGATIVE NEGATIVE mg/dL   Protein, ur NEGATIVE NEGATIVE mg/dL   Nitrite NEGATIVE NEGATIVE   Leukocytes, UA NEGATIVE NEGATIVE   [place] Ob Transvaginal  Result Date: 09/07/2016 CLINICAL DATA:  Lower abdominal pain. First trimester pregnancy with inconclusive fetal viability. Gestational age of [redacted] weeks 0 days by prior ultrasound. EXAM: TRANSVAGINAL OB ULTRASOUND TECHNIQUE: Transvaginal ultrasound was performed for  complete evaluation of the gestation as well as the maternal uterus, adnexal regions, and pelvic cul-de-sac. COMPARISON:  08/29/2016 FINDINGS: Intrauterine gestational sac: Single Yolk sac:  Visualized. Embryo:  Visualized. Cardiac Activity: Visualized. Heart Rate: 150 bpm CRL:   9  mm   6 w 6 d                  US EDC: 04/27/2017 Subchorionic hemorrhage:  None visualized. Maternal uterus/adnexae: Retroverted uterus. Normal appearance of both ovaries. No mass or free fluid identified. IMPRESSION: Single living IUP with assigned gestational age of [redacted] weeks 0 days by prior ultrasound. Appropriate interval growth. Normal embryonic heart rate. No significant maternal uterine or adnexal abnormality identified. Electronically Signed   By: Myles RosenthalJohn  Stahl M.D.   On: 09/07/2016 14:26    Assessment and Plan   1. Pelvic pain affecting pregnancy in first trimester, antepartum   2. Back pain affecting pregnancy in first trimester   3. Nausea and vomiting during pregnancy    IUP at 1528w0d Phenergan and Flexeril prescribed; also advised Tylenol prn pain [facility]16 Was told to return to MAU for any pain, bleeding or other concerns, or if her condition were to change or worsen. Discharged to home in stable condition    Allergies as of 09/07/2016   No Known Allergies     Medication List    TAKE these medications   cyclobenzaprine 10 MG tablet Commonly known as:  FLEXERIL Take 1 tablet (10 mg total) by mouth 3 (three) times daily as needed for muscle spasms.   famotidine 20 MG tablet Commonly known as:  PEPCID Take 1 tablet (20 mg total) by mouth 2 (two) times daily.   Prenatal Vitamin 27-0.8 MG Tabs Take 1 tablet by mouth daily.   promethazine 25 MG tablet Commonly known as:  PHENERGAN Take 1 tablet (25 mg total) by mouth every 6 (six) hours as needed for nausea or vomiting.        Jaynie CollinsUGONNA  Lynanne Delgreco, MD, FACOG Attending Obstetrician & Gynecologist,  [facility], [facility]

## 2016-09-07 NOTE — MAU Note (Signed)
Been having back pain, pain in lower back, upper abd and lower abd. Medication she has been given is not working.  They were going to order an US last night , but her kids couldn't go in with her. Has been vomiting. Doesn't know what is going on.

## 2016-09-17 ENCOUNTER — Inpatient Hospital Stay (HOSPITAL_COMMUNITY)
Admission: AD | Admit: 2016-09-17 | Discharge: 2016-09-17 | Disposition: A | Discharge status: Home / Self Care | Payer: Medicaid Other | Source: Ambulatory Visit | Attending: Obstetrics and Gynecology | Admitting: Obstetrics and Gynecology

## 2016-09-17 ENCOUNTER — Encounter: Payer: Medicaid Other | Admitting: Certified Nurse Midwife

## 2016-09-17 ENCOUNTER — Encounter (HOSPITAL_COMMUNITY): Payer: Self-pay | Admitting: *Deleted

## 2016-09-17 DIAGNOSIS — Z9889 Other specified postprocedural states: Secondary | ICD-10-CM | POA: Diagnosis not present

## 2016-09-17 DIAGNOSIS — Z800 Family history of malignant neoplasm of digestive organs: Secondary | ICD-10-CM | POA: Diagnosis not present

## 2016-09-17 DIAGNOSIS — Z87891 Personal history of nicotine dependence: Secondary | ICD-10-CM | POA: Insufficient documentation

## 2016-09-17 DIAGNOSIS — Z79899 Other long term (current) drug therapy: Secondary | ICD-10-CM | POA: Diagnosis not present

## 2016-09-17 DIAGNOSIS — Z833 Family history of diabetes mellitus: Secondary | ICD-10-CM | POA: Insufficient documentation

## 2016-09-17 DIAGNOSIS — Z8249 Family history of ischemic heart disease and other diseases of the circulatory system: Secondary | ICD-10-CM | POA: Insufficient documentation

## 2016-09-17 DIAGNOSIS — R109 Unspecified abdominal pain: Secondary | ICD-10-CM | POA: Insufficient documentation

## 2016-09-17 DIAGNOSIS — N760 Acute vaginitis: Secondary | ICD-10-CM | POA: Insufficient documentation

## 2016-09-17 DIAGNOSIS — Z3A09 9 weeks gestation of pregnancy: Secondary | ICD-10-CM | POA: Insufficient documentation

## 2016-09-17 DIAGNOSIS — N3000 Acute cystitis without hematuria: Secondary | ICD-10-CM | POA: Insufficient documentation

## 2016-09-17 DIAGNOSIS — M549 Dorsalgia, unspecified: Secondary | ICD-10-CM | POA: Diagnosis not present

## 2016-09-17 DIAGNOSIS — O9989 Other specified diseases and conditions complicating pregnancy, childbirth and the puerperium: Secondary | ICD-10-CM | POA: Diagnosis not present

## 2016-09-17 DIAGNOSIS — O161 Unspecified maternal hypertension, first trimester: Secondary | ICD-10-CM | POA: Diagnosis not present

## 2016-09-17 DIAGNOSIS — O26891 Other specified pregnancy related conditions, first trimester: Secondary | ICD-10-CM | POA: Insufficient documentation

## 2016-09-17 DIAGNOSIS — B9689 Other specified bacterial agents as the cause of diseases classified elsewhere: Secondary | ICD-10-CM | POA: Diagnosis not present

## 2016-09-17 HISTORY — DX: Incompetence of cervix uteri: N88.3

## 2016-09-17 LAB — URINALYSIS, ROUTINE W REFLEX MICROSCOPIC
BILIRUBIN URINE: NEGATIVE
Glucose, UA: NEGATIVE mg/dL
HGB URINE DIPSTICK: NEGATIVE
KETONES UR: NEGATIVE mg/dL
LEUKOCYTES UA: NEGATIVE
NITRITE: POSITIVE — AB
PROTEIN: NEGATIVE mg/dL
Specific Gravity, Urine: 1.018 (ref 1.005–1.030)
pH: 5 (ref 5.0–8.0)

## 2016-09-17 MED ORDER — PHENAZOPYRIDINE HCL 95 MG PO TABS: 95.0000 mg | ORAL_TABLET | Freq: Three times a day (TID) | ORAL | 0 refills | Status: DC | PRN

## 2016-09-17 MED ORDER — CEPHALEXIN 500 MG PO CAPS: 500.0000 mg | ORAL_CAPSULE | Freq: Four times a day (QID) | ORAL | 0 refills | Status: DC

## 2016-09-17 NOTE — Discharge Instructions (Signed)

## 2016-09-17 NOTE — MAU Note (Addendum)
Pain in lower abd and low back. " Needs some medicine for her pain.  Has d/c that is sticking to her underwear.  Her urine smells like boiled eggs"

## 2016-09-17 NOTE — MAU Provider Note (Signed)
Patient [name] is a  26 year old [phone] At [redacted]w[redacted]d here with complaints of abdominal pain and back pain for two days. She denies bleeding but does endorse a "sticky" vaginal discharge. She says that her urine has an odor.  Patient does not want any cultures done today. She does want a cervical exam.    Patient has had 7 MAU visits in the past 6 months with non-specific complaints of abdominal pain and concern about her pregnancy.  She frequently requests an [place] and has had 4 [place] over the course of her 8 week pregnancy. Her new ob appt is scheduled for 09-20-2016.   Most recently she was treated for BV and a UTI; upon questioning it seems that patient has only been taking 1 keflex pill per day for her UTI rather than the prescribed 4 pills. She did complete her flagyl rx.    History     CSN: [id number]  Arrival date and time: 09/17/16 1141   First Provider Initiated Contact with Patient 09/17/16 1407      Chief Complaint  Patient presents with  . Abdominal Pain  . Back Pain  . Vaginal Discharge   Back Pain  This is a new problem. The current episode started in the past 7 days. The problem occurs intermittently. The pain is present in the lumbar spine. The quality of the pain is described as cramping. The pain is at a severity of 9/10. Pertinent negatives include no chest pain, dysuria, headaches, pelvic pain or weakness.   OB History    Gravida Para Term Preterm AB Living   6 1   1 4 2    SAB TAB Ectopic Multiple Live Births   3 1   1 2       Past Medical History:  Diagnosis Date  . Bacterial vaginosis 08/15/2016  . Headache(784.0)   . Hypertension   . Incompetent cervix   . Preterm labor   . Urinary tract infection     Past Surgical History:  Procedure Laterality Date  . BREAST FIBROADENOMA SURGERY  2006-7.   Removal;right  . CESAREAN SECTION    . DILATION AND CURETTAGE OF UTERUS    . THERAPEUTIC ABORTION      Family History  Problem Relation Age of Onset  .  Diabetes Mother   . Diabetes Maternal Grandmother   . Hypertension Maternal Grandmother   . Cancer Maternal Grandmother        colon cancer, brain tumour  . Other Neg Hx     Social History  Substance Use Topics  . Smoking status: Former Smoker    Packs/day: 0.25    Types: Cigarettes    Quit date: 08/17/2016  . Smokeless tobacco: Former Neurosurgeon  . Alcohol use No    Allergies: No Known Allergies  Prescriptions Prior to Admission  Medication Sig Dispense Refill Last Dose  . cyclobenzaprine (FLEXERIL) 10 MG tablet Take 1 tablet (10 mg total) by mouth 3 (three) times daily as needed for muscle spasms. 30 tablet 2   . famotidine (PEPCID) 20 MG tablet Take 1 tablet (20 mg total) by mouth 2 (two) times daily. 60 tablet 1 Past Week at Unknown time  . Prenatal Vit-Fe Fumarate-FA (PRENATAL VITAMIN) 27-0.8 MG TABS Take 1 tablet by mouth daily. 30 tablet 3 09/07/2016 at Unknown time  . promethazine (PHENERGAN) 25 MG tablet Take 1 tablet (25 mg total) by mouth every 6 (six) hours as needed for nausea or vomiting. 30 tablet 2  Review of Systems  Respiratory: Negative.   Cardiovascular: Negative for chest pain.  Genitourinary: Positive for vaginal discharge. Negative for dyspareunia, dysuria, flank pain, pelvic pain, urgency, vaginal bleeding and vaginal pain.  Musculoskeletal: Positive for back pain.  Neurological: Negative for weakness and headaches.   Physical Exam   Blood pressure 140/72, pulse 82, temperature 98.2 F (36.8 C), temperature source Oral, resp. rate 18, weight 240 lb (108.9 kg), last menstrual period 07/11/2016, SpO2 100 %, unknown if currently breastfeeding.  Physical Exam  Constitutional: She is oriented to person, place, and time. She appears well-developed.  HENT:  Head: Normocephalic.  Neck: Normal range of motion.  Respiratory: Effort normal.  GI: Soft. Bowel sounds are normal. She exhibits no distension and no mass. There is no tenderness. There is no rebound and  no guarding.  Genitourinary:  Genitourinary Comments: NEFG; cervix is closed and posterior.   Musculoskeletal: Normal range of motion.  Neurological: She is alert and oriented to person, place, and time.  Skin: Skin is warm and dry.    MAU Course  Procedures  MDM -UA: positive for nitrites; urine culture sent.  Patient does not want wet prep or GC CT; cervical exam performed.   Assessment and Plan   1. Acute cystitis without hematuria   2. Bacterial vaginosis    2. Patient stable for discharge with emphasis on taking her antibiotic 4 times per day for the next 10 days.   3. RX for pyridium sent.   4. Patient knows to return to the MAU if she develops fever or increased back pain not relieved by tylenol. Patient plans to keep her NOB appt on 09-20-2016.   Charlesetta GaribaldiKathryn Lorraine Kooistra 09/17/2016, 2:11 PM

## 2016-09-19 ENCOUNTER — Encounter: Payer: Self-pay | Admitting: *Deleted

## 2016-09-19 ENCOUNTER — Ambulatory Visit (HOSPITAL_COMMUNITY): Admission: RE | Admit: 2016-09-19 | Payer: Medicaid Other | Source: Ambulatory Visit

## 2016-09-19 ENCOUNTER — Telehealth: Payer: Self-pay | Admitting: General Practice

## 2016-09-19 ENCOUNTER — Telehealth: Payer: Self-pay

## 2016-09-19 DIAGNOSIS — Z3480 Encounter for supervision of other normal pregnancy, unspecified trimester: Secondary | ICD-10-CM | POA: Insufficient documentation

## 2016-09-19 LAB — URINE CULTURE: Culture: 80000 — AB

## 2016-09-19 NOTE — Telephone Encounter (Signed)
[facility]s that was scheduled for 9 am today. Pt states that [facility] told her the u/s could not be done because she had one done on 6/1. Pt wanted our office to call over there to have them do the u/s. I advised pt that she has not had her new OB appt yet, and that the u/s order was not order by a MD here so we are not able to make them do this. Pt new ob appt is 6/14

## 2016-09-19 NOTE — Telephone Encounter (Signed)
Erric Machnik from ultrasound called asking if ultrasound needed to be completed today due to normal viability scan on 6/1. Told her viability scan did not need to be completed as that was previously scheduled before incidental ultrasound on 6/1. No indication to do ultrasound today. Patient has [facility].

## 2016-09-20 ENCOUNTER — Ambulatory Visit (INDEPENDENT_AMBULATORY_CARE_PROVIDER_SITE_OTHER): Payer: Medicaid Other | Admitting: Obstetrics

## 2016-09-20 ENCOUNTER — Other Ambulatory Visit (HOSPITAL_COMMUNITY)
Admission: RE | Admit: 2016-09-20 | Discharge: 2016-09-20 | Disposition: A | Discharge status: Home / Self Care | Payer: Medicaid Other | Source: Ambulatory Visit | Attending: Obstetrics | Admitting: Obstetrics

## 2016-09-20 ENCOUNTER — Encounter: Payer: Self-pay | Admitting: Obstetrics

## 2016-09-20 VITALS — BP 114/76 | HR 82 | Wt 237.0 lb

## 2016-09-20 DIAGNOSIS — Z01419 Encounter for gynecological examination (general) (routine) without abnormal findings: Secondary | ICD-10-CM | POA: Insufficient documentation

## 2016-09-20 DIAGNOSIS — Z3481 Encounter for supervision of other normal pregnancy, first trimester: Secondary | ICD-10-CM

## 2016-09-20 DIAGNOSIS — Z3480 Encounter for supervision of other normal pregnancy, unspecified trimester: Secondary | ICD-10-CM

## 2016-09-20 DIAGNOSIS — B9689 Other specified bacterial agents as the cause of diseases classified elsewhere: Secondary | ICD-10-CM

## 2016-09-20 DIAGNOSIS — N760 Acute vaginitis: Secondary | ICD-10-CM

## 2016-09-20 MED ORDER — PRENATE MINI 29-0.6-0.4-350 MG PO CAPS: 1.0000 | ORAL_CAPSULE | Freq: Every day | ORAL | 4 refills | Status: DC

## 2016-09-20 NOTE — Progress Notes (Addendum)
Subjective:    [name] is being seen today for her first obstetrical visit.  This is not a planned pregnancy. She is at [redacted]w[redacted]d gestation. Her obstetrical history is significant for none. Relationship with FOB: significant other, not living together. Patient does intend to breast feed. Pregnancy history fully reviewed.  The information documented in the HPI was reviewed and verified.  Menstrual History: OB History    Gravida Para Term Preterm AB Living   6 1   1 4 2    SAB TAB Ectopic Multiple Live Births   3 1   1 2       Obstetric Comments   D&C      Patient's last menstrual period was 07/11/2016 (exact date).    Past Medical History:  Diagnosis Date  . Bacterial vaginosis 08/15/2016  . Headache(784.0)   . Hypertension   . Incompetent cervix   . Preterm labor   . Urinary tract infection     Past Surgical History:  Procedure Laterality Date  . BREAST FIBROADENOMA SURGERY  2006-7.   Removal;right  . CESAREAN SECTION    . DILATION AND CURETTAGE OF UTERUS    . THERAPEUTIC ABORTION       (Not in a hospital admission) No Known Allergies  Social History  Substance Use Topics  . Smoking status: Former Smoker    Packs/day: 0.25    Types: Cigarettes    Quit date: 08/17/2016  . Smokeless tobacco: Former Neurosurgeon  . Alcohol use No    Family History  Problem Relation Age of Onset  . Diabetes Mother   . Diabetes Maternal Grandmother   . Hypertension Maternal Grandmother   . Cancer Maternal Grandmother        colon cancer, brain tumour  . Other Neg Hx      Review of Systems Constitutional: negative for weight loss Gastrointestinal: negative for vomiting Genitourinary:negative for genital lesions and vaginal discharge and dysuria Musculoskeletal:negative for back pain Behavioral/Psych: negative for abusive relationship, depression, illegal drug usage and tobacco use    Objective:    BP 114/76   Pulse 82   Wt 237 lb (107.5 kg)   LMP 07/11/2016 (Exact Date)   BMI  40.68 kg/m  General Appearance:    Alert, cooperative, no distress, appears stated age  Head:    Normocephalic, without obvious abnormality, atraumatic  Eyes:    PERRL, conjunctiva/corneas clear, EOM's intact, fundi    benign, both eyes  Ears:    Normal TM's and external ear canals, both ears  Nose:   Nares normal, septum midline, mucosa normal, no drainage    or sinus tenderness  Throat:   Lips, mucosa, and tongue normal; teeth and gums normal  Neck:   Supple, symmetrical, trachea midline, no adenopathy;    thyroid:  no enlargement/tenderness/nodules; no carotid   bruit or JVD  Back:     Symmetric, no curvature, ROM normal, no CVA tenderness  Lungs:     Clear to auscultation bilaterally, respirations unlabored  Chest Wall:    No tenderness or deformity   Heart:    Regular rate and rhythm, S1 and S2 normal, no murmur, rub   or gallop  Breast Exam:    No tenderness, masses, or nipple abnormality  Abdomen:     Soft, non-tender, bowel sounds active all four quadrants,    no masses, no organomegaly  Genitalia:    Normal female without lesion, discharge or tenderness  Extremities:   Extremities normal, atraumatic, no cyanosis or  edema  Pulses:   2+ and symmetric all extremities  Skin:   Skin color, texture, turgor normal, no rashes or lesions  Lymph nodes:   Cervical, supraclavicular, and axillary nodes normal  Neurologic:   CNII-XII intact, normal strength, sensation and reflexes    throughout      Lab Review Urine pregnancy test Labs reviewed yes Radiologic studies reviewed yes  Assessment:    Pregnancy at 6664w6d weeks    Plan:     1. Supervision of other normal pregnancy, antepartum Rx: - Cytology - PAP - HIV antibody - Hemoglobinopathy evaluation - Varicella zoster antibody, IgG - VITAMIN D 25 Hydroxy (Vit-D Deficiency, Fractures) - Prenatal Profile I - US bedside - Prenat w/o A-FeCbn-Meth-FA-DHA (PRENATE MINI) 29-0.6-0.4-350 MG CAPS; Take 1 capsule by mouth daily before  breakfast.  Dispense: 90 capsule; Refill: 4  2. Bacterial vaginosis - Flagyl Rx  Prenatal vitamins.  Counseling provided regarding continued use of seat belts, cessation of alcohol consumption, smoking or use of illicit drugs; infection precautions i.e., influenza/TDAP immunizations, toxoplasmosis,CMV, parvovirus, listeria and varicella; workplace safety, exercise during pregnancy; routine dental care, safe medications, sexual activity, hot tubs, saunas, pools, travel, caffeine use, fish and methlymercury, potential toxins, hair treatments, varicose veins Weight gain recommendations per IOM guidelines reviewed: underweight/BMI< 18.5--> gain 28 - 40 lbs; normal weight/BMI 18.5 - 24.9--> gain 25 - 35 lbs; overweight/BMI 25 - 29.9--> gain 15 - 25 lbs; obese/BMI >30->gain  11 - 20 lbs Problem list reviewed and updated. FIRST/CF mutation testing/NIPT/QUAD SCREEN/fragile X/Ashkenazi Jewish population testing/Spinal muscular atrophy discussed: requested. Role of ultrasound in pregnancy discussed; fetal survey: requested. Amniocentesis discussed: not indicated. VBAC calculator score: VBAC consent form provided  Meds ordered this encounter  Medications  . Prenat w/o A-FeCbn-Meth-FA-DHA (PRENATE MINI) 29-0.6-0.4-350 MG CAPS    Sig: Take 1 capsule by mouth daily before breakfast.    Dispense:  90 capsule    Refill:  4   Orders Placed This Encounter  Procedures  . US bedside  . HIV antibody  . Hemoglobinopathy evaluation  . Varicella zoster antibody, IgG  . VITAMIN D 25 Hydroxy (Vit-D Deficiency, Fractures)  . Prenatal Profile I    Follow up in 4 weeks. 50% of 20 min visit spent on counseling and coordination of care.

## 2016-09-20 NOTE — Progress Notes (Addendum)
NOB no concerns today. Pt does not live with FOB. Plans to Breast and Formula feed. Bedside u/s confirms viable pregnancy, FHT's 158.

## 2016-09-22 ENCOUNTER — Other Ambulatory Visit: Payer: Self-pay | Admitting: Obstetrics

## 2016-09-22 DIAGNOSIS — E559 Vitamin D deficiency, unspecified: Secondary | ICD-10-CM

## 2016-09-22 LAB — PRENATAL PROFILE I(LABCORP)
Antibody Screen: NEGATIVE
Basophils Absolute: 0 10*3/uL (ref 0.0–0.2)
Basos: 0 %
EOS (ABSOLUTE): 0 10*3/uL (ref 0.0–0.4)
EOS: 1 %
Hematocrit: 36.1 % (ref 34.0–46.6)
Hemoglobin: 11.9 g/dL (ref 11.1–15.9)
Hepatitis B Surface Ag: NEGATIVE
IMMATURE GRANULOCYTES: 0 %
Immature Grans (Abs): 0 10*3/uL (ref 0.0–0.1)
Lymphocytes Absolute: 1.7 10*3/uL (ref 0.7–3.1)
Lymphs: 28 %
MCH: 28.7 pg (ref 26.6–33.0)
MCHC: 33 g/dL (ref 31.5–35.7)
MCV: 87 fL (ref 79–97)
MONOCYTES: 5 %
Monocytes Absolute: 0.3 10*3/uL (ref 0.1–0.9)
NEUTROS PCT: 66 %
Neutrophils Absolute: 4.2 10*3/uL (ref 1.4–7.0)
Platelets: 297 10*3/uL (ref 150–379)
RBC: 4.15 x10E6/uL (ref 3.77–5.28)
RDW: 13.8 % (ref 12.3–15.4)
RPR Ser Ql: NONREACTIVE
RUBELLA: 3.01 {index} (ref >0.99)
Rh Factor: POSITIVE
WBC: 6.2 10*3/uL (ref 3.4–10.8)

## 2016-09-22 LAB — HEMOGLOBINOPATHY EVALUATION
HEMOGLOBIN A2 QUANTITATION: 2.4 % (ref 1.8–3.2)
HGB C: 0 %
HGB S: 0 %
HGB VARIANT: 0 %
Hemoglobin F Quantitation: 0 % (ref 0.0–2.0)
Hgb A: 97.6 % (ref 96.4–98.8)

## 2016-09-22 LAB — VITAMIN D 25 HYDROXY (VIT D DEFICIENCY, FRACTURES): VIT D 25 HYDROXY: 23.2 ng/mL — AB (ref 30.0–100.0)

## 2016-09-22 LAB — HIV ANTIBODY (ROUTINE TESTING W REFLEX): HIV Screen 4th Generation wRfx: NONREACTIVE

## 2016-09-22 LAB — VARICELLA ZOSTER ANTIBODY, IGG: VARICELLA: 1324 {index} (ref >165)

## 2016-09-22 MED ORDER — VITAMIN D 50 MCG (2000 UT) PO CAPS: 1.0000 | ORAL_CAPSULE | Freq: Every day | ORAL | 5 refills | Status: DC

## 2016-09-24 LAB — CYTOLOGY - PAP
Adequacy: ABSENT
Diagnosis: NEGATIVE

## 2016-09-25 ENCOUNTER — Other Ambulatory Visit: Payer: Self-pay | Admitting: Obstetrics

## 2016-09-25 DIAGNOSIS — B373 Candidiasis of vulva and vagina: Secondary | ICD-10-CM

## 2016-09-25 DIAGNOSIS — B3731 Acute candidiasis of vulva and vagina: Secondary | ICD-10-CM

## 2016-09-26 ENCOUNTER — Encounter (HOSPITAL_COMMUNITY): Payer: Self-pay | Admitting: Obstetrics and Gynecology

## 2016-09-26 ENCOUNTER — Inpatient Hospital Stay (HOSPITAL_COMMUNITY)
Admission: AD | Admit: 2016-09-26 | Discharge: 2016-09-26 | Disposition: A | Discharge status: Home / Self Care | Payer: Medicaid Other | Source: Ambulatory Visit | Attending: Family Medicine | Admitting: Family Medicine

## 2016-09-26 ENCOUNTER — Telehealth: Payer: Self-pay

## 2016-09-26 DIAGNOSIS — O23591 Infection of other part of genital tract in pregnancy, first trimester: Secondary | ICD-10-CM | POA: Diagnosis not present

## 2016-09-26 DIAGNOSIS — O161 Unspecified maternal hypertension, first trimester: Secondary | ICD-10-CM | POA: Insufficient documentation

## 2016-09-26 DIAGNOSIS — M549 Dorsalgia, unspecified: Secondary | ICD-10-CM

## 2016-09-26 DIAGNOSIS — O2341 Unspecified infection of urinary tract in pregnancy, first trimester: Secondary | ICD-10-CM | POA: Diagnosis not present

## 2016-09-26 DIAGNOSIS — Z3A09 9 weeks gestation of pregnancy: Secondary | ICD-10-CM | POA: Insufficient documentation

## 2016-09-26 DIAGNOSIS — M545 Low back pain: Secondary | ICD-10-CM | POA: Insufficient documentation

## 2016-09-26 DIAGNOSIS — O09211 Supervision of pregnancy with history of pre-term labor, first trimester: Secondary | ICD-10-CM | POA: Insufficient documentation

## 2016-09-26 DIAGNOSIS — O26891 Other specified pregnancy related conditions, first trimester: Secondary | ICD-10-CM | POA: Insufficient documentation

## 2016-09-26 DIAGNOSIS — B9689 Other specified bacterial agents as the cause of diseases classified elsewhere: Secondary | ICD-10-CM | POA: Insufficient documentation

## 2016-09-26 DIAGNOSIS — O99891 Other specified diseases and conditions complicating pregnancy: Secondary | ICD-10-CM

## 2016-09-26 DIAGNOSIS — N760 Acute vaginitis: Secondary | ICD-10-CM | POA: Insufficient documentation

## 2016-09-26 DIAGNOSIS — Z87891 Personal history of nicotine dependence: Secondary | ICD-10-CM | POA: Diagnosis not present

## 2016-09-26 DIAGNOSIS — O9989 Other specified diseases and conditions complicating pregnancy, childbirth and the puerperium: Secondary | ICD-10-CM

## 2016-09-26 LAB — CBC WITH DIFFERENTIAL/PLATELET
BASOS ABS: 0 10*3/uL (ref 0.0–0.1)
BASOS PCT: 0 %
EOS ABS: 0 10*3/uL (ref 0.0–0.7)
Eosinophils Relative: 1 %
HEMATOCRIT: 34 % — AB (ref 36.0–46.0)
HEMOGLOBIN: 11.3 g/dL — AB (ref 12.0–15.0)
Lymphocytes Relative: 30 %
Lymphs Abs: 1.9 10*3/uL (ref 0.7–4.0)
MCH: 29.1 pg (ref 26.0–34.0)
MCHC: 33.2 g/dL (ref 30.0–36.0)
MCV: 87.6 fL (ref 78.0–100.0)
MONOS PCT: 3 %
Monocytes Absolute: 0.2 10*3/uL (ref 0.1–1.0)
NEUTROS ABS: 4.2 10*3/uL (ref 1.7–7.7)
NEUTROS PCT: 66 %
Platelets: 275 10*3/uL (ref 150–400)
RBC: 3.88 MIL/uL (ref 3.87–5.11)
RDW: 13.1 % (ref 11.5–15.5)
WBC: 6.4 10*3/uL (ref 4.0–10.5)

## 2016-09-26 LAB — COMPREHENSIVE METABOLIC PANEL
ALBUMIN: 3.3 g/dL — AB (ref 3.5–5.0)
ALK PHOS: 67 U/L (ref 38–126)
ALT: 13 U/L — ABNORMAL LOW (ref 14–54)
ANION GAP: 6 (ref 5–15)
AST: 17 U/L (ref 15–41)
BUN: 7 mg/dL (ref 6–20)
CO2: 25 mmol/L (ref 22–32)
Calcium: 9.1 mg/dL (ref 8.9–10.3)
Chloride: 104 mmol/L (ref 101–111)
Creatinine, Ser: 0.72 mg/dL (ref 0.44–1.00)
GFR calc Af Amer: >60 mL/min (ref >60)
GFR calc non Af Amer: >60 mL/min (ref >60)
GLUCOSE: 88 mg/dL (ref 65–99)
POTASSIUM: 3.6 mmol/L (ref 3.5–5.1)
SODIUM: 135 mmol/L (ref 135–145)
Total Bilirubin: 0.3 mg/dL (ref 0.3–1.2)
Total Protein: 7 g/dL (ref 6.5–8.1)

## 2016-09-26 LAB — URINALYSIS, ROUTINE W REFLEX MICROSCOPIC
Bilirubin Urine: NEGATIVE
GLUCOSE, UA: NEGATIVE mg/dL
HGB URINE DIPSTICK: NEGATIVE
Ketones, ur: NEGATIVE mg/dL
Leukocytes, UA: NEGATIVE
Nitrite: NEGATIVE
PH: 6 (ref 5.0–8.0)
Protein, ur: NEGATIVE mg/dL
SPECIFIC GRAVITY, URINE: 1.015 (ref 1.005–1.030)

## 2016-09-26 MED ORDER — CEFTRIAXONE SODIUM 1 G IJ SOLR
2.0000 g | Freq: Once | INTRAMUSCULAR | Status: AC
  Administered 2016-09-26: 2 g via INTRAMUSCULAR
  Filled 2016-09-26: qty 20

## 2016-09-26 MED ORDER — ACETAMINOPHEN 500 MG PO TABS
1000.0000 mg | ORAL_TABLET | Freq: Once | ORAL | Status: AC
  Administered 2016-09-26: 1000 mg via ORAL
  Filled 2016-09-26: qty 2

## 2016-09-26 NOTE — MAU Provider Note (Signed)
History     CSN: [id number]  Arrival date and time: 09/26/16 1229   First Provider Initiated Contact with Patient 09/26/16 1308      Chief Complaint  Patient presents with  . Abdominal Pain   [name] is 26 yo [id number] at 9.[redacted] wks gestation by U/S presenting with complaints of mid to lower back pain.  She has not taken anything for the pain.  She is currently on her last day of taking Keflex, but she reports "not being able to keep it down anytime" she takes it. She is requesting a different abx.  [facility]  She was last seen for her initial PNV on 6/14 where she had an informal bedside U/S that confirmed a fetal heartbeat. She denies VB, abd pain or LOF.  Back Pain  This is a recurrent problem. The current episode started yesterday. The problem occurs constantly. The problem is unchanged. The pain is present in the sacro-iliac. The quality of the pain is described as aching. The pain does not radiate. The pain is at a severity of 8/10. The pain is moderate. The pain is the same all the time. The symptoms are aggravated by position. Pertinent negatives include no abdominal pain. She has tried nothing for the symptoms.     Past Medical History:  Diagnosis Date  . Bacterial vaginosis 08/15/2016  . Headache(784.0)   . Hypertension   . Incompetent cervix   . Preterm labor   . Urinary tract infection     Past Surgical History:  Procedure Laterality Date  . BREAST FIBROADENOMA SURGERY  2006-7.   Removal;right  . CESAREAN SECTION    . DILATION AND CURETTAGE OF UTERUS    . THERAPEUTIC ABORTION      Family History  Problem Relation Age of Onset  . Diabetes Mother   . Diabetes Maternal Grandmother   . Hypertension Maternal Grandmother   . Cancer Maternal Grandmother        colon cancer, brain tumour  . Other Neg Hx     Social History  Substance Use Topics  . Smoking status: Former Smoker    Packs/day: 0.25    Types: Cigarettes    Quit date:  08/17/2016  . Smokeless tobacco: Former NeurosurgeonUser  . Alcohol use No    Allergies: No Known Allergies  No prescriptions prior to admission.    Review of Systems  Constitutional: Negative.   HENT: Negative.   Eyes: Negative.   Respiratory: Negative.   Cardiovascular: Negative.   Gastrointestinal: Positive for nausea and vomiting. Negative for abdominal pain.  Endocrine: Negative.   Genitourinary: Negative.   Musculoskeletal: Positive for back pain (mid to low back).  Skin: Negative.   Allergic/Immunologic: Negative.   Neurological: Negative.   Hematological: Negative.   Psychiatric/Behavioral: Negative.    Physical Exam   Blood pressure 113/69, pulse 79, temperature 98.3 F (36.8 C), temperature source Oral, resp. rate 16, last menstrual period 07/11/2016.  Physical Exam  Constitutional: She is oriented to person, place, and time. She appears well-developed and well-nourished.  HENT:  Head: Normocephalic.  Eyes: Pupils are equal, round, and reactive to light.  Neck: Normal range of motion.  Cardiovascular: Normal rate, regular rhythm, normal heart sounds and intact distal pulses.   Respiratory: Effort normal and breath sounds normal.  GI: Soft. Bowel sounds are normal.  Genitourinary:  Genitourinary Comments: Deferred per pt request  Musculoskeletal: Normal range of motion.  Neurological: She is alert and oriented to person,  place, and time. She has normal reflexes.  Skin: Skin is warm and dry.  Psychiatric: She has a normal mood and affect. Her behavior is normal. Judgment and thought content normal.    MAU Course  Procedures  MDM CCUA CBC CMP Rocephin 2 gm IM  x1 *Consult with Dr. Alvester Morin on med change and dose  Results for orders placed or performed during the hospital encounter of 09/26/16 (from the past 24 hour(s))  Urinalysis, Routine w reflex microscopic     Status: None   Collection Time: 09/26/16 12:55 PM  Result Value Ref Range   Color, Urine YELLOW  YELLOW   APPearance CLEAR CLEAR   Specific Gravity, Urine 1.015 1.005 - 1.030   pH 6.0 5.0 - 8.0   Glucose, UA NEGATIVE NEGATIVE mg/dL   Hgb urine dipstick NEGATIVE NEGATIVE   Bilirubin Urine NEGATIVE NEGATIVE   Ketones, ur NEGATIVE NEGATIVE mg/dL   Protein, ur NEGATIVE NEGATIVE mg/dL   Nitrite NEGATIVE NEGATIVE   Leukocytes, UA NEGATIVE NEGATIVE  CBC with Differential/Platelet     Status: Abnormal   Collection Time: 09/26/16  1:12 PM  Result Value Ref Range   WBC 6.4 4.0 - 10.5 K/uL   RBC 3.88 3.87 - 5.11 MIL/uL   Hemoglobin 11.3 (L) 12.0 - 15.0 g/dL   HCT 16.1 (L) 09.6 - 04.5 %   MCV 87.6 78.0 - 100.0 fL   MCH 29.1 26.0 - 34.0 pg   MCHC 33.2 30.0 - 36.0 g/dL   RDW 40.9 81.1 - 91.4 %   Platelets 275 150 - 400 K/uL   Neutrophils Relative % 66 %   Neutro Abs 4.2 1.7 - 7.7 K/uL   Lymphocytes Relative 30 %   Lymphs Abs 1.9 0.7 - 4.0 K/uL   Monocytes Relative 3 %   Monocytes Absolute 0.2 0.1 - 1.0 K/uL   Eosinophils Relative 1 %   Eosinophils Absolute 0.0 0.0 - 0.7 K/uL   Basophils Relative 0 %   Basophils Absolute 0.0 0.0 - 0.1 K/uL  Comprehensive metabolic panel     Status: Abnormal   Collection Time: 09/26/16  1:12 PM  Result Value Ref Range   Sodium 135 135 - 145 mmol/L   Potassium 3.6 3.5 - 5.1 mmol/L   Chloride 104 101 - 111 mmol/L   CO2 25 22 - 32 mmol/L   Glucose, Bld 88 65 - 99 mg/dL   BUN 7 6 - 20 mg/dL   Creatinine, Ser 7.82 0.44 - 1.00 mg/dL   Calcium 9.1 8.9 - 95.6 mg/dL   Total Protein 7.0 6.5 - 8.1 g/dL   Albumin 3.3 (L) 3.5 - 5.0 g/dL   AST 17 15 - 41 U/L   ALT 13 (L) 14 - 54 U/L   Alkaline Phosphatase 67 38 - 126 U/L   Total Bilirubin 0.3 0.3 - 1.2 mg/dL   GFR calc non Af Amer >60 >60 mL/min   GFR calc Af Amer >60 >60 mL/min   Anion gap 6 5 - 15    Assessment and Plan  Back pain affecting pregnancy in first trimester - Advised to take Tylenol 1000 mg po prn pain  Urinary tract infection in mother during first trimester of pregnancy -  Instructions an UTI in pregnancy given  - Advised that injection received in MAU will cover the UTI and should not need further abx   Discharge home Keep scheduled appt on 10/18/16 at [facility] Patient verbalized an understanding of the plan of care and agrees.  Raelyn Mora, MSN, CNM 09/26/2016, 1:31 PM

## 2016-09-26 NOTE — MAU Note (Addendum)
Pt. States that she has had intermittent pain in lower back/shoulders and lower abdomen that started last night. Pt. Denies vaginal bleeding.

## 2016-09-26 NOTE — Telephone Encounter (Signed)
Contacted pt to advise of vitamin D rx that was sent, left vm

## 2016-09-26 NOTE — MAU Note (Signed)
Urine in lab 

## 2016-09-27 ENCOUNTER — Telehealth: Payer: Self-pay

## 2016-09-27 NOTE — Telephone Encounter (Signed)
Pt called in, advised of results and rx sent for vitamin d by provider.

## 2016-10-09 ENCOUNTER — Inpatient Hospital Stay (HOSPITAL_COMMUNITY)
Admission: AD | Admit: 2016-10-09 | Discharge: 2016-10-09 | Disposition: A | Discharge status: Home / Self Care | Payer: Medicaid Other | Source: Ambulatory Visit | Attending: Family Medicine | Admitting: Family Medicine

## 2016-10-09 ENCOUNTER — Encounter (HOSPITAL_COMMUNITY): Payer: Self-pay | Admitting: *Deleted

## 2016-10-09 DIAGNOSIS — O209 Hemorrhage in early pregnancy, unspecified: Secondary | ICD-10-CM | POA: Diagnosis not present

## 2016-10-09 DIAGNOSIS — O26851 Spotting complicating pregnancy, first trimester: Secondary | ICD-10-CM | POA: Diagnosis not present

## 2016-10-09 DIAGNOSIS — N760 Acute vaginitis: Secondary | ICD-10-CM

## 2016-10-09 DIAGNOSIS — K5900 Constipation, unspecified: Secondary | ICD-10-CM | POA: Insufficient documentation

## 2016-10-09 DIAGNOSIS — B9689 Other specified bacterial agents as the cause of diseases classified elsewhere: Secondary | ICD-10-CM

## 2016-10-09 DIAGNOSIS — O99611 Diseases of the digestive system complicating pregnancy, first trimester: Secondary | ICD-10-CM | POA: Diagnosis not present

## 2016-10-09 DIAGNOSIS — Z87891 Personal history of nicotine dependence: Secondary | ICD-10-CM | POA: Insufficient documentation

## 2016-10-09 DIAGNOSIS — Z3A11 11 weeks gestation of pregnancy: Secondary | ICD-10-CM | POA: Insufficient documentation

## 2016-10-09 DIAGNOSIS — Z3480 Encounter for supervision of other normal pregnancy, unspecified trimester: Secondary | ICD-10-CM

## 2016-10-09 LAB — URINALYSIS, ROUTINE W REFLEX MICROSCOPIC
Bilirubin Urine: NEGATIVE
Glucose, UA: NEGATIVE mg/dL
Hgb urine dipstick: NEGATIVE
Ketones, ur: NEGATIVE mg/dL
LEUKOCYTES UA: NEGATIVE
NITRITE: NEGATIVE
PH: 6 (ref 5.0–8.0)
Protein, ur: NEGATIVE mg/dL
SPECIFIC GRAVITY, URINE: 1.017 (ref 1.005–1.030)

## 2016-10-09 LAB — CBC
HCT: 33.6 % — ABNORMAL LOW (ref 36.0–46.0)
HEMOGLOBIN: 11.3 g/dL — AB (ref 12.0–15.0)
MCH: 29.2 pg (ref 26.0–34.0)
MCHC: 33.6 g/dL (ref 30.0–36.0)
MCV: 86.8 fL (ref 78.0–100.0)
Platelets: 281 10*3/uL (ref 150–400)
RBC: 3.87 MIL/uL (ref 3.87–5.11)
RDW: 13 % (ref 11.5–15.5)
WBC: 7.7 10*3/uL (ref 4.0–10.5)

## 2016-10-09 NOTE — MAU Note (Signed)
PT  SAYS   WHEN SHE VOIDS  AND  WIPES  - SHE SAW PINK  ON TOILET PAPER- STARTED AT 6PM.    PNC- WITH [facility] .   LAST SEX-  April.    FEELS CRAMPS ON LLQ.

## 2016-10-09 NOTE — MAU Note (Signed)
[facility] pt. not in room..provider notified.

## 2016-10-09 NOTE — MAU Provider Note (Signed)
History     CSN: [id number]  Arrival date and time: 10/09/16 1911   First Provider Initiated Contact with Patient 10/09/16 2042      Chief Complaint  Patient presents with  . Vaginal Bleeding   HPI  Ms. Sheree Lalla is a 26 yo [id number] at 11.[redacted] wks gestation presenting to MAU tonight with complaints of spotting when she wiped, but not on her peripad.  She reports some intermittent, mild LLQ cramping.  She denies any recent IC; last was in April 2018.  She does report having constipation.  Her last BM was Monday 10/01/2016.    Past Medical History:  Diagnosis Date  . Bacterial vaginosis 08/15/2016  . Headache(784.0)   . Hypertension   . Incompetent cervix   . Preterm labor   . Urinary tract infection     Past Surgical History:  Procedure Laterality Date  . BREAST FIBROADENOMA SURGERY  2006-7.   Removal;right  . CESAREAN SECTION    . DILATION AND CURETTAGE OF UTERUS    . THERAPEUTIC ABORTION      Family History  Problem Relation Age of Onset  . Diabetes Mother   . Diabetes Maternal Grandmother   . Hypertension Maternal Grandmother   . Cancer Maternal Grandmother        colon cancer, brain tumour  . Other Neg Hx     Social History  Substance Use Topics  . Smoking status: Former Smoker    Packs/day: 0.25    Types: Cigarettes    Quit date: 08/17/2016  . Smokeless tobacco: Former Neurosurgeon  . Alcohol use No    Allergies: No Known Allergies  Prescriptions Prior to Admission  Medication Sig Dispense Refill Last Dose  . cephALEXin (KEFLEX) 500 MG capsule Take 1 capsule (500 mg total) by mouth 4 (four) times daily. 28 capsule 0 09/26/2016 at Unknown time  . Cholecalciferol (VITAMIN D) 2000 units CAPS Take 1 capsule (2,000 Units total) by mouth daily before breakfast. 30 capsule 5 09/26/2016 at Unknown time  . famotidine (PEPCID) 20 MG tablet Take 1 tablet (20 mg total) by mouth 2 (two) times daily. (Patient not taking: Reported on 09/20/2016) 60 tablet 1 Not Taking at Unknown  time  . Prenat w/o A-FeCbn-Meth-FA-DHA (PRENATE MINI) 29-0.6-0.4-350 MG CAPS Take 1 capsule by mouth daily before breakfast. 90 capsule 4 09/26/2016 at Unknown time  . Prenatal Vit-Fe Fumarate-FA (PRENATAL VITAMIN) 27-0.8 MG TABS Take 1 tablet by mouth daily. (Patient not taking: Reported on 09/26/2016) 30 tablet 3 Not Taking at Unknown time  . promethazine (PHENERGAN) 25 MG tablet Take 1 tablet (25 mg total) by mouth every 6 (six) hours as needed for nausea or vomiting. (Patient not taking: Reported on 09/20/2016) 30 tablet 2 Not Taking at Unknown time    Review of Systems  Constitutional: Negative.   HENT: Negative.   Eyes: Negative.   Respiratory: Negative.   Cardiovascular: Negative.   Gastrointestinal: Positive for abdominal pain (LLQ cramping) and constipation.  Endocrine: Negative.   Genitourinary: Positive for vaginal bleeding (light spotting with wiping).  Musculoskeletal: Negative.   Skin: Negative.   Allergic/Immunologic: Negative.   Neurological: Negative.   Hematological: Negative.   Psychiatric/Behavioral: Negative.    Physical Exam   Blood pressure 121/66, pulse (!) 101, temperature 99.8 F (37.7 C), temperature source Oral, resp. rate 20, height 5\' 4"  (1.626 m), weight 106.8 kg (235 lb 8 oz), last menstrual period 07/11/2016, unknown if currently breastfeeding.  Physical Exam  Constitutional: She is oriented to person,  place, and time. She appears well-developed and well-nourished.  HENT:  Head: Normocephalic.  Eyes: Pupils are equal, round, and reactive to light.  Neck: Normal range of motion.  Cardiovascular: Normal rate, regular rhythm and normal heart sounds.   Respiratory: Effort normal and breath sounds normal.  GI: Soft. Bowel sounds are normal.  Genitourinary:  Genitourinary Comments: Uterus: difficult to palpate, cx; closed/long/firm, no CMT or adnexal tenderness -- patient declined speculum exam / informal BS U/S: very active fetus with HR of 168 bpm --  patient reassured    Neurological: She is alert and oriented to person, place, and time.  Skin: Skin is warm and dry.  Psychiatric: She has a normal mood and affect. Her behavior is normal. Judgment and thought content normal.    MAU Course  Procedures  MDM CCUA Wet Prep offered, but was declined by patient VE - as noted in PE above  Results for orders placed or performed during the hospital encounter of 10/09/16 (from the past 24 hour(s))  Urinalysis, Routine w reflex microscopic     Status: None   Collection Time: 10/09/16  7:22 PM  Result Value Ref Range   Color, Urine YELLOW YELLOW   APPearance CLEAR CLEAR   Specific Gravity, Urine 1.017 1.005 - 1.030   pH 6.0 5.0 - 8.0   Glucose, UA NEGATIVE NEGATIVE mg/dL   Hgb urine dipstick NEGATIVE NEGATIVE   Bilirubin Urine NEGATIVE NEGATIVE   Ketones, ur NEGATIVE NEGATIVE mg/dL   Protein, ur NEGATIVE NEGATIVE mg/dL   Nitrite NEGATIVE NEGATIVE   Leukocytes, UA NEGATIVE NEGATIVE  CBC     Status: Abnormal   Collection Time: 10/09/16  8:41 PM  Result Value Ref Range   WBC 7.7 4.0 - 10.5 K/uL   RBC 3.87 3.87 - 5.11 MIL/uL   Hemoglobin 11.3 (L) 12.0 - 15.0 g/dL   HCT 16.133.6 (L) 09.636.0 - 04.546.0 %   MCV 86.8 78.0 - 100.0 fL   MCH 29.2 26.0 - 34.0 pg   MCHC 33.6 30.0 - 36.0 g/dL   RDW 40.913.0 81.111.5 - 91.415.5 %   Platelets 281 150 - 400 K/uL    Assessment and Plan  Vaginal bleeding in pregnancy, first trimester - Call the office or return to MAU for bleeding like a period  Constipation, unspecified constipation type - Give poop regimen to follow - Advised to drink at least 8-10 glasses of water daily  Discharge home Patient verbalized an understanding of the plan of care and agrees.   Raelyn Moraolitta Marsi Turvey, MSN, CNM 10/09/2016, 8:49 PM

## 2016-10-09 NOTE — Discharge Instructions (Signed)
Constipation Regimen You have constipation which is hard stools that are difficult to pass. It is important to have regular bowel movements every 1-3 days that are soft and easy to pass. Hard stools increase your risk of hemorrhoids and are very uncomfortable.   To prevent constipation you can increase the amount of fiber in your diet. Examples of foods with fiber are leafy greens, whole grain breads, oatmeal and other grains.  It is also important to drink at least eight 8oz glass of water everyday.   If you have not has a bowel movement in 4-5 days you made need to clean out your bowel.  This will have establish normal movement through your bowel.    Miralax Clean out  Take 8 capfuls of miralax in 64 oz of gatorade. You can use any fluid that appeals to you (gatorade, water, juice)  Continue to drink at least eight 8 oz glasses of water throughout the day  You can repeat with another 8 capfuls of miralax in 64 oz of gatorade if you are not having a large amount of stools  You will need to be at home and close to a bathroom for about 8 hours when you do the above as you may need to go to the bathroom frequently.   After you are cleaned out: - Start Colace100mg  twice daily - Start Miralax once daily - Start a daily fiber supplement like metamucil or citrucel - You can safely use enemas in pregnancy  - if you are having diarrhea you can reduce to Colace once a day or miralax every other day or a 1/2 capful daily.

## 2016-10-18 ENCOUNTER — Ambulatory Visit (INDEPENDENT_AMBULATORY_CARE_PROVIDER_SITE_OTHER): Payer: Medicaid Other | Admitting: Obstetrics & Gynecology

## 2016-10-18 ENCOUNTER — Encounter: Payer: Medicaid Other | Admitting: Obstetrics & Gynecology

## 2016-10-18 VITALS — BP 111/79 | HR 97 | Wt 234.0 lb

## 2016-10-18 DIAGNOSIS — Z3689 Encounter for other specified antenatal screening: Secondary | ICD-10-CM

## 2016-10-18 DIAGNOSIS — Z3482 Encounter for supervision of other normal pregnancy, second trimester: Secondary | ICD-10-CM

## 2016-10-18 DIAGNOSIS — Z3480 Encounter for supervision of other normal pregnancy, unspecified trimester: Secondary | ICD-10-CM

## 2016-10-18 NOTE — Patient Instructions (Signed)
Return to clinic for any scheduled appointments or obstetric concerns, or go to MAU for evaluation  

## 2016-10-18 NOTE — Progress Notes (Signed)
   PRENATAL VISIT NOTE (Patient was seen during power outage; note details entered later)  Subjective:  [name] is a 26 y.o. [id number] at 3014w6d being seen today for ongoing prenatal care.  She is currently monitored for the following issues for this low-risk pregnancy and has OBESITY; TOBACCO USE, QUIT; and Supervision of other normal pregnancy, antepartum on her problem list.  Patient reports no complaints.  Contractions: Not present. Vag. Bleeding: None.  Movement: Absent. Denies leaking of fluid.   The following portions of the patient's history were reviewed and updated as appropriate: allergies, current medications, past family history, past medical history, past social history, past surgical history and problem list. Problem list updated.  Objective:   Vitals:   10/18/16 1628  BP: 111/79  Pulse: 97  Weight: 234 lb (106.1 kg)    Fetal Status: Fetal Heart Rate (bpm): 161   Movement: Absent     General:  Alert, oriented and cooperative. Patient is in no acute distress.  Skin: Skin is warm and dry. No rash noted.   Cardiovascular: Normal heart rate noted  Respiratory: Normal respiratory effort, no problems with respiration noted  Abdomen: Soft, gravid, appropriate for gestational age. Pain/Pressure: Absent     Pelvic:  Cervical exam deferred        Extremities: Normal range of motion.  Edema: None  Mental Status: Normal mood and affect. Normal behavior. Normal judgment and thought content.   Assessment and Plan:  Pregnancy: [id number] at 214w6d  1. Supervision of other normal pregnancy, antepartum 2. Encounter for fetal anatomic survey Desires NIPS, will come back for lab draw of [id number]. Ordered anatomy scan. - US MFM OB DETAIL +14 WK; Future No other complaints or concerns.  Routine obstetric precautions reviewed. Please refer to After Visit Summary for other counseling recommendations.  Return in about 4 weeks (around 11/15/2016) for OB Visit.   Jaynie CollinsUgonna Anyanwu, MD

## 2016-10-19 ENCOUNTER — Other Ambulatory Visit: Payer: Medicaid Other

## 2016-10-19 NOTE — Addendum Note (Signed)
Addended by: Hamilton CapriBURCH, Shantia Sanford J on: 10/19/2016 11:40 AM   Modules accepted: Orders

## 2016-10-22 ENCOUNTER — Encounter: Payer: Medicaid Other | Admitting: Certified Nurse Midwife

## 2016-10-25 LAB — MATERNIT21 PLUS CORE+SCA
CHROMOSOME 13: NEGATIVE
CHROMOSOME 18: NEGATIVE
CHROMOSOME 21: NEGATIVE
Y Chromosome: DETECTED

## 2016-11-06 ENCOUNTER — Inpatient Hospital Stay (HOSPITAL_COMMUNITY)
Admission: AD | Admit: 2016-11-06 | Discharge: 2016-11-07 | Disposition: A | Discharge status: Home / Self Care | Payer: Medicaid Other | Source: Ambulatory Visit | Attending: Family Medicine | Admitting: Family Medicine

## 2016-11-06 ENCOUNTER — Encounter (HOSPITAL_COMMUNITY): Payer: Self-pay | Admitting: *Deleted

## 2016-11-06 ENCOUNTER — Inpatient Hospital Stay (HOSPITAL_COMMUNITY): Payer: Medicaid Other

## 2016-11-06 DIAGNOSIS — M549 Dorsalgia, unspecified: Secondary | ICD-10-CM | POA: Insufficient documentation

## 2016-11-06 DIAGNOSIS — O26892 Other specified pregnancy related conditions, second trimester: Secondary | ICD-10-CM | POA: Diagnosis not present

## 2016-11-06 DIAGNOSIS — R109 Unspecified abdominal pain: Secondary | ICD-10-CM | POA: Insufficient documentation

## 2016-11-06 DIAGNOSIS — Z3A15 15 weeks gestation of pregnancy: Secondary | ICD-10-CM | POA: Insufficient documentation

## 2016-11-06 DIAGNOSIS — Z87891 Personal history of nicotine dependence: Secondary | ICD-10-CM | POA: Insufficient documentation

## 2016-11-06 DIAGNOSIS — O9989 Other specified diseases and conditions complicating pregnancy, childbirth and the puerperium: Secondary | ICD-10-CM

## 2016-11-06 LAB — URINALYSIS, ROUTINE W REFLEX MICROSCOPIC
BILIRUBIN URINE: NEGATIVE
Glucose, UA: NEGATIVE mg/dL
Hgb urine dipstick: NEGATIVE
Ketones, ur: NEGATIVE mg/dL
Leukocytes, UA: NEGATIVE
Nitrite: NEGATIVE
Protein, ur: NEGATIVE mg/dL
SPECIFIC GRAVITY, URINE: 1.026 (ref 1.005–1.030)
pH: 5 (ref 5.0–8.0)

## 2016-11-06 NOTE — MAU Provider Note (Signed)
History     CSN: [id number]  Arrival date and time: 11/06/16 2217   First Provider Initiated Contact with Patient 11/06/16 2306      Chief Complaint  Patient presents with  . Abdominal Pain  . Back Pain   HPI   [name] is a 26 y.o. female [phone] at [redacted]w[redacted]d here in MAU with abdominal pain and back pain. Symptoms started yesterday around 5:30 pm and have continued throughout the night and throughout the day.  Has also noted some pelvic pressure. 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 Clearance Coots. . States she has missed some OB appointments due to family issues.   Patient states she has a history of incompetent cervix says she saw an OB Dr. For this and was told it was due to multiple abortions. Patient confirmed that she has had 5 terminations.   OB History    Gravida Para Term Preterm AB Living   6 1   1 4 2    SAB TAB Ectopic Multiple Live Births   3 1   1 2       Obstetric Comments   D&C      Past Medical History:  Diagnosis Date  . Bacterial vaginosis 08/15/2016  . Headache(784.0)   . Hypertension   . Incompetent cervix   . Preterm labor   . Urinary tract infection     Past Surgical History:  Procedure Laterality Date  . BREAST FIBROADENOMA SURGERY  2006-7.   Removal;right  . CESAREAN SECTION    . DILATION AND CURETTAGE OF UTERUS    . THERAPEUTIC ABORTION      Family History  Problem Relation Age of Onset  . Diabetes Mother   . Diabetes Maternal Grandmother   . Hypertension Maternal Grandmother   . Cancer Maternal Grandmother        colon cancer, brain tumour  . Other Neg Hx     Social History  Substance Use Topics  . Smoking status: Former Smoker    Packs/day: 0.25    Types: Cigarettes    Quit date: 08/17/2016  . Smokeless tobacco: Former Neurosurgeon  . Alcohol use No    Allergies: No Known Allergies  Prescriptions Prior to Admission  Medication Sig Dispense Refill Last Dose   . cyclobenzaprine (FLEXERIL) 5 MG tablet Take 5 mg by mouth 3 (three) times daily as needed for muscle spasms.   Past Week at Unknown time  . Prenat w/o A-FeCbn-Meth-FA-DHA (PRENATE MINI) 29-0.6-0.4-350 MG CAPS Take 1 capsule by mouth daily before breakfast. 90 capsule 4 11/05/2016 at Unknown time  . Prenatal Vit-Fe Fumarate-FA (PRENATAL VITAMIN) 27-0.8 MG TABS Take 1 tablet by mouth daily. (Patient not taking: Reported on 09/26/2016) 30 tablet 3 Not Taking at Unknown time  . promethazine (PHENERGAN) 25 MG tablet Take 1 tablet (25 mg total) by mouth every 6 (six) hours as needed for nausea or vomiting. (Patient not taking: Reported on 09/20/2016) 30 tablet 2 Not Taking at Unknown time   Results for orders placed or performed during the hospital encounter of 11/06/16 (from the past 48 hour(s))  Urinalysis, Routine w reflex microscopic     Status: None   Collection Time: 11/06/16 10:24 PM  Result Value Ref Range   Color, Urine YELLOW YELLOW   APPearance CLEAR CLEAR   Specific Gravity, Urine 1.026 1.005 - 1.030   pH 5.0 5.0 - 8.0   Glucose, UA NEGATIVE NEGATIVE mg/dL  Hgb urine dipstick NEGATIVE NEGATIVE   Bilirubin Urine NEGATIVE NEGATIVE   Ketones, ur NEGATIVE NEGATIVE mg/dL   Protein, ur NEGATIVE NEGATIVE mg/dL   Nitrite NEGATIVE NEGATIVE   Leukocytes, UA NEGATIVE NEGATIVE   [place] Mfm Ob Transvaginal  Result Date: 11/07/2016 ----------------------------------------------------------------------  OBSTETRICS REPORT                      (Signed Final 11/07/2016 08:15 am) ---------------------------------------------------------------------- Patient Info  ID #:       [id number]                         D.O.B.:   [birth year] (26 yrs)  Name:       [name]                  Visit Date:  11/06/2016 11:50 pm ---------------------------------------------------------------------- Performed By  Performed By:     Ellin SabaSusan M Kennedy        Referred By:      MAU Nursing-                    RDMS                                      MAU/Triage  Attending:        Clarene CritchleyPaul W Whitecar        Location[facility]         [facility]                    MD ---------------------------------------------------------------------- Orders   #  Description                                 Code   1  US MFM OB TRANSVAGINAL                      [phone] [id number]  ----------------------------------------------------------------------   #  Ordered By               Order #        Accession #    Episode #   1  Venia CarbonJENNIFER Laiya Wisby           [id number]      [id number]     [id number]  ---------------------------------------------------------------------- Indications   [redacted] weeks gestation of pregnancy                Z3A.16   Abdominal pain in pregnancy                    O99.89   Cervical incompetence, second trimester        O34.32   Poor obstetric history: Previous preterm       O09.219   delivery, antepartum x 2 35 weeks   Previous cesarean delivery, antepartum x 1     O34.219   H/O termination x 5   Obesity complicating pregnancy, second         O99.212   trimester   Poor obstetric history: Previous               O09.299   preeclampsia / eclampsia/gestational HTN  ---------------------------------------------------------------------- OB History  Gravidity:    10        Prem:   2  SAB:   3  TOP:          5        Living:  2 ---------------------------------------------------------------------- Fetal Evaluation  Num Of Fetuses:     1  Fetal Heart         161  Rate(bpm):  Cardiac Activity:   Observed  Presentation:       Transverse, head to maternal right  Amniotic Fluid  AFI FV:      Subjectively within normal limits ---------------------------------------------------------------------- Gestational Age  LMP:           16w 6d       Date:   07/11/16                 EDD:   04/17/17  Best:          16w 6d    Det. By:   LMP  (07/11/16)          EDD:   04/17/17 ---------------------------------------------------------------------- Cervix Uterus Adnexa  Cervix  Length:               3  cm.  Normal appearance by transvaginal scan ---------------------------------------------------------------------- Impression  Single IUP at 16w 6d  Limited ultrasound performed for cervical length  Normal amniotic fluid volume  TVUS - persistent lower uterine segment contraction -  cervical length is at least 3 cm without funneling. ---------------------------------------------------------------------- Recommendations  Follow-up ultrasounds as clinically indicated. ----------------------------------------------------------------------                Candis ShinePaul W Whitecar, MD Electronically Signed Final Report   11/07/2016 08:15 am ----------------------------------------------------------------------  Review of Systems  Gastrointestinal: Positive for abdominal pain. Negative for nausea and vomiting.  Genitourinary: Positive for pelvic pain. Negative for vaginal bleeding.   Physical Exam   Blood pressure 106/78, pulse (!) 103, temperature 99 F (37.2 C), temperature source Oral, resp. rate 17, height 5\' 4"  (1.626 m), weight 238 lb 1.3 oz (108 kg), last menstrual period 07/11/2016, unknown if currently breastfeeding.  Physical Exam  Constitutional: She is oriented to person, place, and time. She appears well-developed and well-nourished. No distress.  HENT:  Head: Normocephalic.  GI: Soft. She exhibits no distension and no mass. There is no tenderness. There is no rebound and no guarding.  Genitourinary:  Genitourinary Comments:  Cervix: Long, closed, posterior   Musculoskeletal: Normal range of motion.  Neurological: She is alert and oriented to person, place, and time.  Skin: Skin is warm. She is not diaphoretic.  Psychiatric: Her behavior is normal.    MAU Course  Procedures  None  MDM  + fetal heart tones via doppler.  Cervical length by transvaginal US 3.0 cm.  Ibuprofen 600 mg given PO  Discussed patient with Dr. Adrian BlackwaterStinson Assessment and Plan   A:  1. Abdominal pain in  pregnancy, second trimester   2. Back pain in pregnancy     P:  Discharge home in stable condition Rx: ibuprofen, limited use only # 10 nrf Strict return precautions Sent a message to the [place] office to schedule an appointment this week with Dr. Clearance [name] to discuss preterm labor, cerclage etc. Patient has a lot of questions and does not understand the plan of care.  Preterm labor precautions   Tashawn Laswell, Harolyn RutherfordJennifer I, NP 11/08/2016 10:36 AM

## 2016-11-06 NOTE — MAU Note (Signed)
Pt presents to MAU c/o stomach pain and back pain that started yesterday. Pt stated the pain comes and goes but gets worse when she uses the BR. Pt stated when she went to the doctor on July 13th they diagnosed her with UTI pt states she has been thru her antibiotics for her UTI. Pt denies bleeding and LOF.

## 2016-11-07 ENCOUNTER — Encounter: Payer: Medicaid Other | Admitting: Obstetrics & Gynecology

## 2016-11-07 DIAGNOSIS — R109 Unspecified abdominal pain: Secondary | ICD-10-CM | POA: Diagnosis not present

## 2016-11-07 DIAGNOSIS — O26892 Other specified pregnancy related conditions, second trimester: Secondary | ICD-10-CM | POA: Diagnosis not present

## 2016-11-07 MED ORDER — IBUPROFEN 600 MG PO TABS: 600.0000 mg | ORAL_TABLET | Freq: Four times a day (QID) | ORAL | 0 refills | Status: DC | PRN

## 2016-11-07 MED ORDER — IBUPROFEN 600 MG PO TABS
600.0000 mg | ORAL_TABLET | Freq: Once | ORAL | Status: AC
  Administered 2016-11-07: 600 mg via ORAL
  Filled 2016-11-07: qty 1

## 2016-11-07 NOTE — Discharge Instructions (Signed)

## 2016-11-12 ENCOUNTER — Other Ambulatory Visit: Payer: Self-pay | Admitting: Certified Nurse Midwife

## 2016-11-14 IMAGING — US US OB TRANSVAGINAL
1 series · 15 of 28 positions shown · non-contrast
Comparison: None.

CLINICAL DATA: Abdominal pain in pregnancy. Gestational age by LMP
of 7 weeks 0 days.

EXAM:
OBSTETRIC <14 WK US AND TRANSVAGINAL OB US
TECHNIQUE: Both transabdominal and transvaginal ultrasound examinations were
performed for complete evaluation of the gestation as well as the
maternal uterus, adnexal regions, and pelvic cul-de-sac.
Transvaginal technique was performed to assess early pregnancy.

[Series 1: us ob transvaginal · 79 acquisitions, 15 frames shown]
[im 1/79]
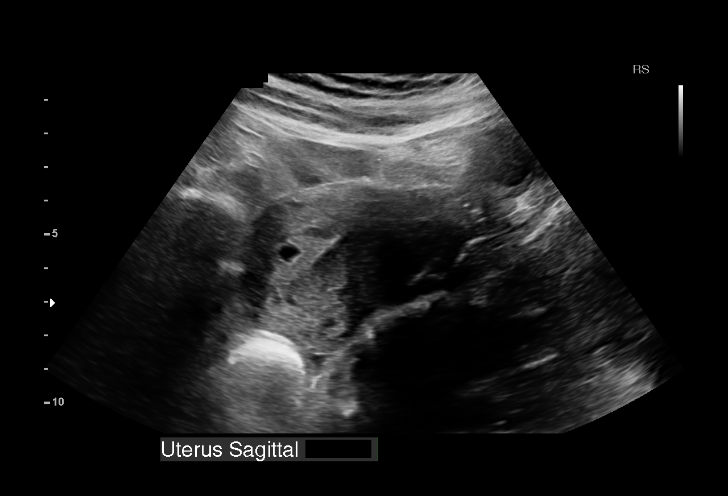
[im 6/79]
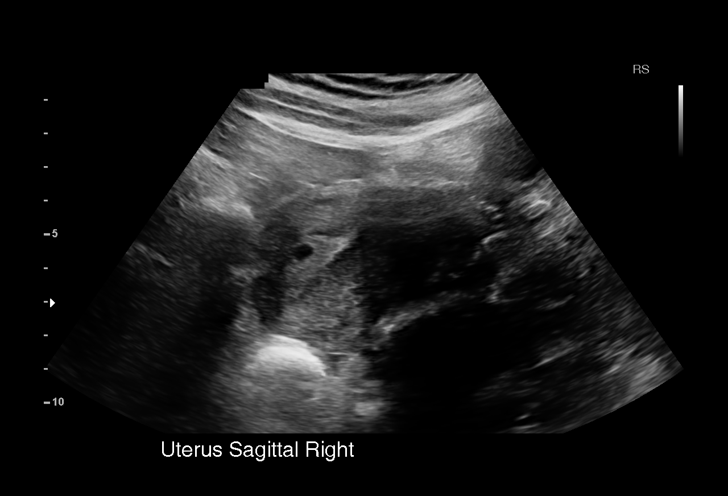
[im 12/79]
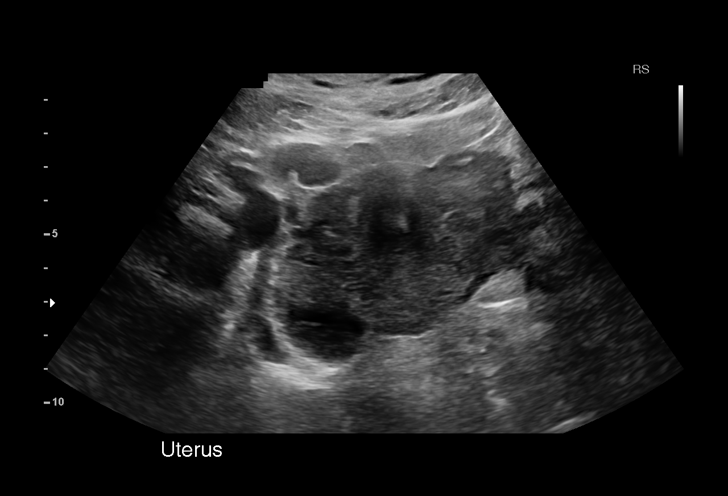
[im 18/79]
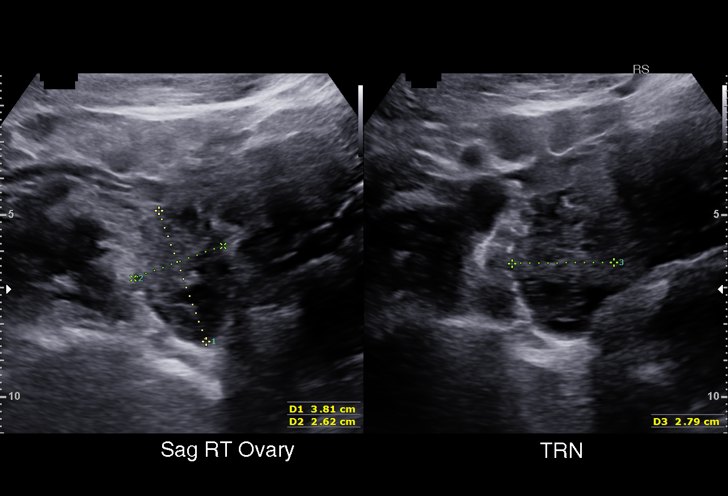
[im 24/79]
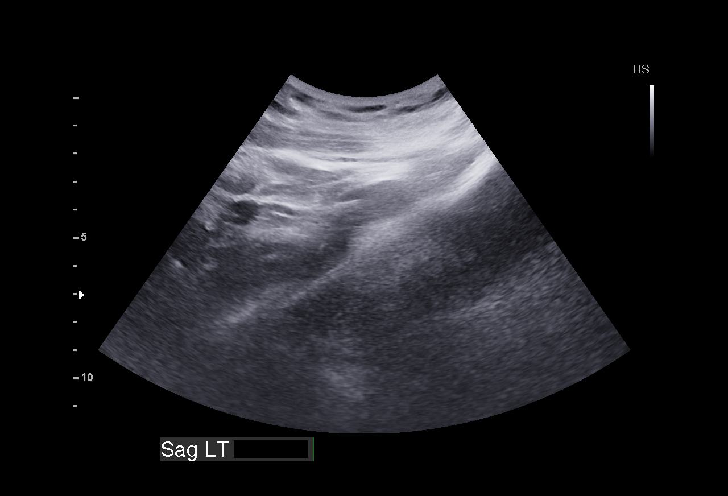
[im 29/79]
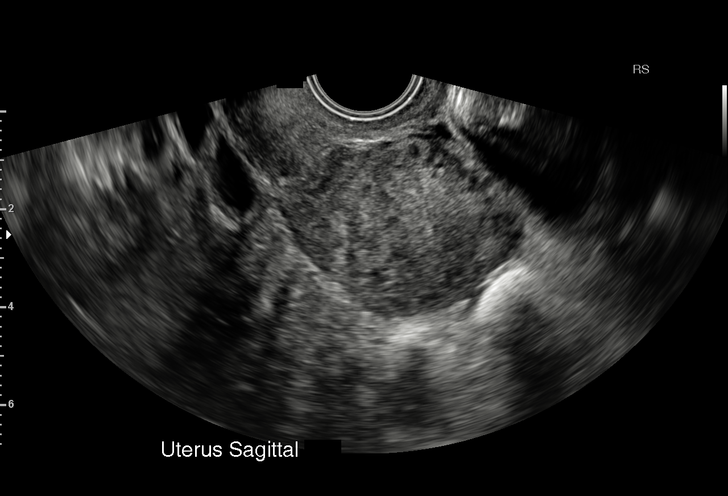
[im 35/79]
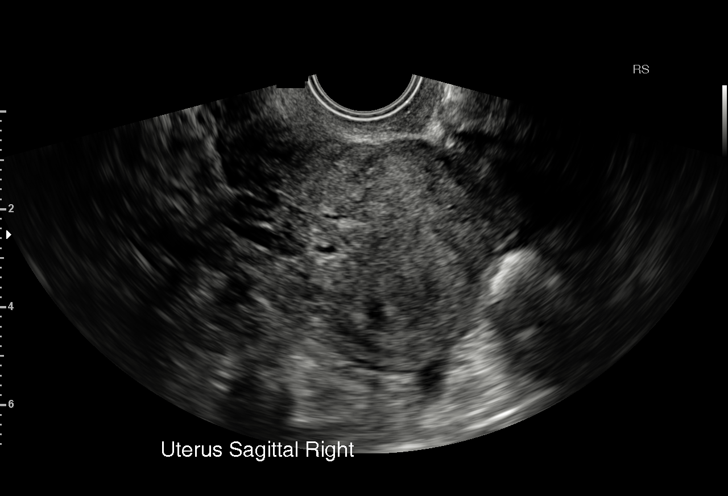
[im 41/79]
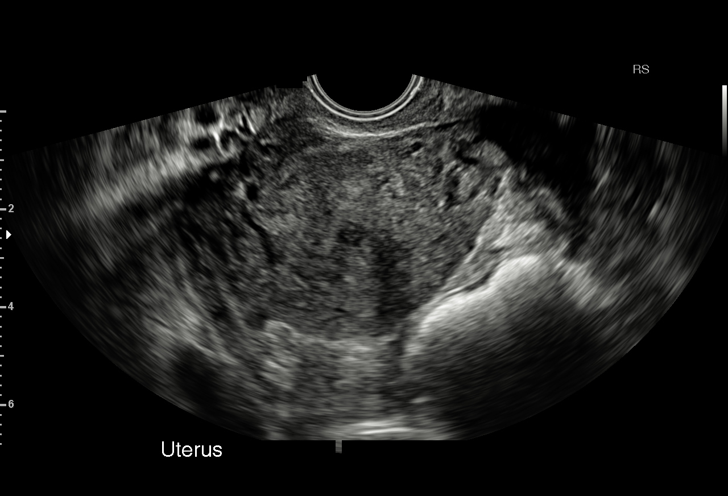
[im 44/79]
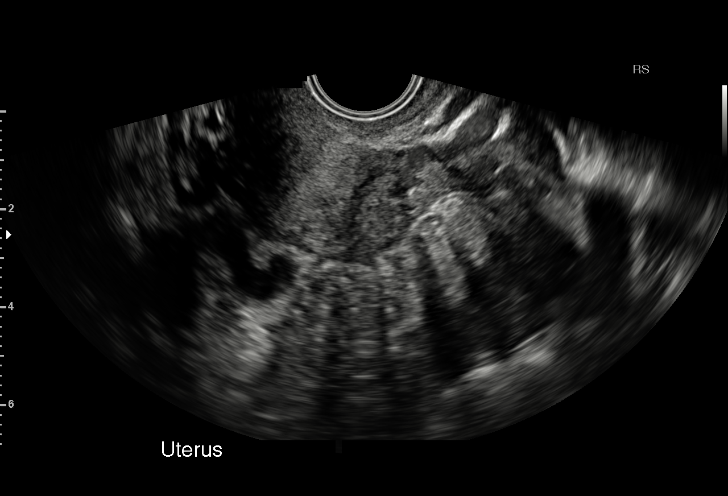
[im 50/79]
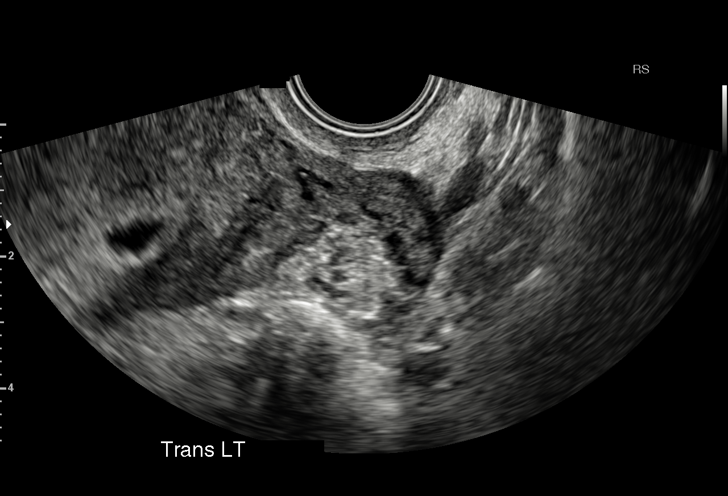
[im 55/79]
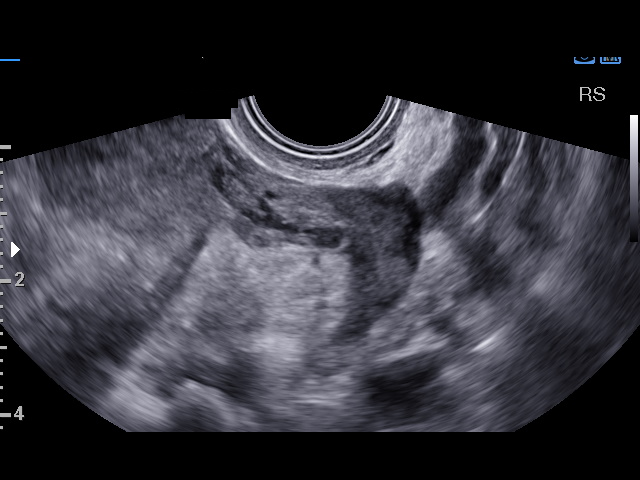
[im 61/79]
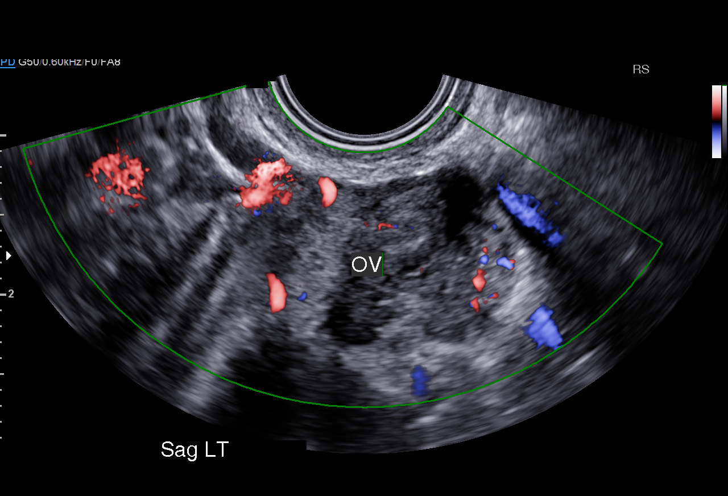
[im 67/79]
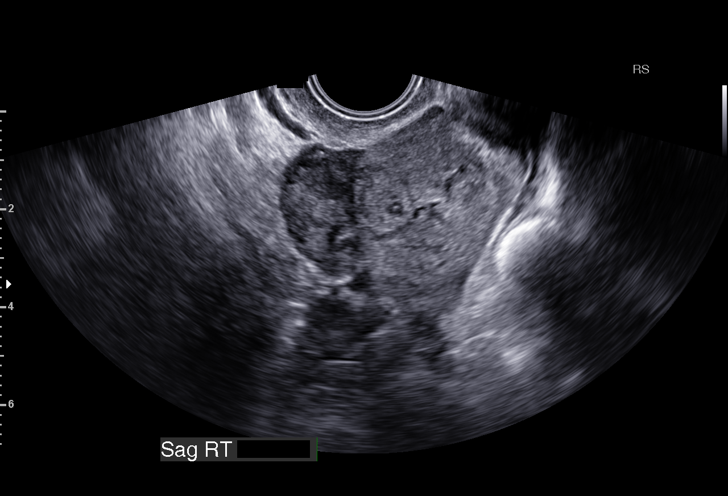
[im 73/79]
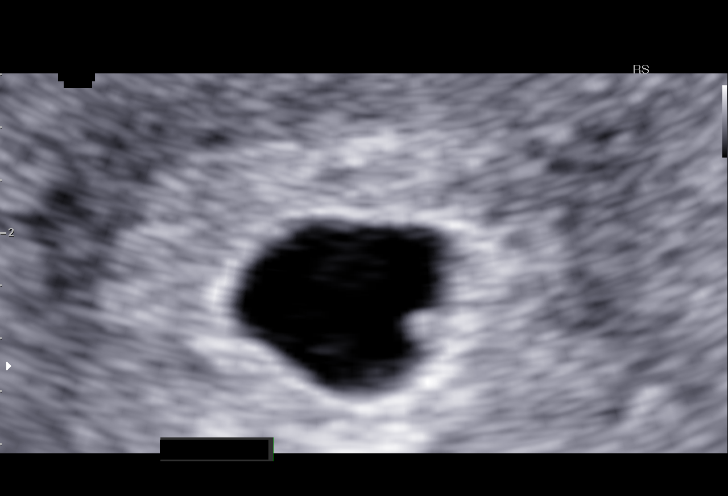
[im 79/79]
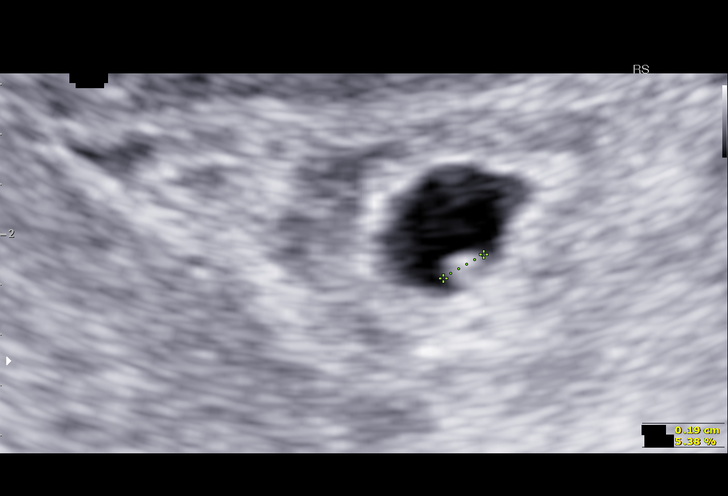

[15 of 28 positions shown; findings below may reference images not displayed]

FINDINGS: Intrauterine gestational sac: Visualized/normal in shape.

Yolk sac:  Probable yolk sac

Embryo:  None visualized

MSD: 8  mm   5 w 4 d

Subchorionic hemorrhage:  None visualized.

Maternal uterus/adnexae: Retroverted uterus. Right ovarian corpus
luteum noted. Left ovary is unremarkable in appearance. Tiny amount
of simple free fluid noted in left adnexa, but no definite mass
visualized.
IMPRESSION: Single intrauterine gestational sac with estimated gestational age
of 5 weeks 4 days by mean sac diameter. Recommend followup of
quantitative beta HCG levels, and consider followup ultrasound to
assess viability in 10 days.

## 2016-11-30 ENCOUNTER — Ambulatory Visit (HOSPITAL_COMMUNITY)
Admission: RE | Admit: 2016-11-30 | Discharge: 2016-11-30 | Disposition: A | Discharge status: Home / Self Care | Payer: Medicaid Other | Source: Ambulatory Visit | Attending: Obstetrics & Gynecology | Admitting: Obstetrics & Gynecology

## 2016-11-30 DIAGNOSIS — Z3480 Encounter for supervision of other normal pregnancy, unspecified trimester: Secondary | ICD-10-CM

## 2016-11-30 DIAGNOSIS — Z3689 Encounter for other specified antenatal screening: Secondary | ICD-10-CM

## 2016-11-30 DIAGNOSIS — O3432 Maternal care for cervical incompetence, second trimester: Secondary | ICD-10-CM | POA: Insufficient documentation

## 2016-11-30 DIAGNOSIS — O09299 Supervision of pregnancy with other poor reproductive or obstetric history, unspecified trimester: Secondary | ICD-10-CM | POA: Insufficient documentation

## 2016-11-30 DIAGNOSIS — O34219 Maternal care for unspecified type scar from previous cesarean delivery: Secondary | ICD-10-CM | POA: Diagnosis not present

## 2016-11-30 DIAGNOSIS — Z363 Encounter for antenatal screening for malformations: Secondary | ICD-10-CM | POA: Diagnosis not present

## 2016-11-30 DIAGNOSIS — Z6841 Body Mass Index (BMI) 40.0 and over, adult: Secondary | ICD-10-CM | POA: Diagnosis not present

## 2016-11-30 DIAGNOSIS — O09219 Supervision of pregnancy with history of pre-term labor, unspecified trimester: Secondary | ICD-10-CM | POA: Insufficient documentation

## 2016-11-30 DIAGNOSIS — O321XX0 Maternal care for breech presentation, not applicable or unspecified: Secondary | ICD-10-CM | POA: Diagnosis not present

## 2016-11-30 DIAGNOSIS — O99212 Obesity complicating pregnancy, second trimester: Secondary | ICD-10-CM | POA: Diagnosis not present

## 2016-11-30 DIAGNOSIS — Z3A20 20 weeks gestation of pregnancy: Secondary | ICD-10-CM | POA: Diagnosis not present

## 2016-12-07 ENCOUNTER — Ambulatory Visit (INDEPENDENT_AMBULATORY_CARE_PROVIDER_SITE_OTHER): Payer: Medicaid Other | Admitting: Certified Nurse Midwife

## 2016-12-07 VITALS — BP 111/74 | HR 96 | Wt 236.8 lb

## 2016-12-07 DIAGNOSIS — Z3480 Encounter for supervision of other normal pregnancy, unspecified trimester: Secondary | ICD-10-CM

## 2016-12-07 DIAGNOSIS — Z3482 Encounter for supervision of other normal pregnancy, second trimester: Secondary | ICD-10-CM

## 2016-12-07 NOTE — Progress Notes (Signed)
   PRENATAL VISIT NOTE  Subjective:  [name] is a 26 y.o. [id number] at 3642w0d being seen today for ongoing prenatal care.  She is currently monitored for the following issues for this low-risk pregnancy and has OBESITY; TOBACCO USE, QUIT; and Supervision of other normal pregnancy, antepartum on her problem list.  Patient reports backache, no bleeding, no contractions and no leaking.  Contractions: Not present. Vag. Bleeding: None.  Movement: Present. Denies leaking of fluid.   The following portions of the patient's history were reviewed and updated as appropriate: allergies, current medications, past family history, past medical history, past social history, past surgical history and problem list. Problem list updated.  Objective:   Vitals:   12/07/16 1019  BP: 111/74  Pulse: 96  Weight: 236 lb 12.8 oz (107.4 kg)    Fetal Status: Fetal Heart Rate (bpm): 151; doppler   Movement: Present     General:  Alert, oriented and cooperative. Patient is in no acute distress.  Skin: Skin is warm and dry. No rash noted.   Cardiovascular: Normal heart rate noted  Respiratory: Normal respiratory effort, no problems with respiration noted  Abdomen: Soft, gravid, appropriate for gestational age.  Pain/Pressure: Present     Pelvic: Cervical exam deferred        Extremities: Normal range of motion.  Edema: None  Mental Status:  Normal mood and affect. Normal behavior. Normal judgment and thought content.   Assessment and Plan:  Pregnancy: [id number] at 2642w0d  1. Supervision of other normal pregnancy, antepartum      Doing well. Increased water intake encouraged, is not drinking any water.  - AFP, Serum, Open Spina Bifida - US MFM OB FOLLOW UP; Future  Preterm labor symptoms and general obstetric precautions including but not limited to vaginal bleeding, contractions, leaking of fluid and fetal movement were reviewed in detail with the patient. Please refer to After Visit Summary for other  counseling recommendations.  Return in about 8 weeks (around 02/01/2017) for ROB, 2 hr OGTT, babyscripts.   Roe Coombsachelle A Ahmya Bernick, CNM

## 2016-12-10 ENCOUNTER — Inpatient Hospital Stay (HOSPITAL_COMMUNITY)
Admission: AD | Admit: 2016-12-10 | Discharge: 2016-12-11 | Disposition: A | Discharge status: Home / Self Care | Payer: Medicaid Other | Source: Ambulatory Visit | Attending: Obstetrics & Gynecology | Admitting: Obstetrics & Gynecology

## 2016-12-10 DIAGNOSIS — Z87891 Personal history of nicotine dependence: Secondary | ICD-10-CM | POA: Insufficient documentation

## 2016-12-10 DIAGNOSIS — O26892 Other specified pregnancy related conditions, second trimester: Secondary | ICD-10-CM | POA: Insufficient documentation

## 2016-12-10 DIAGNOSIS — Z3A20 20 weeks gestation of pregnancy: Secondary | ICD-10-CM

## 2016-12-10 DIAGNOSIS — O9989 Other specified diseases and conditions complicating pregnancy, childbirth and the puerperium: Secondary | ICD-10-CM

## 2016-12-10 DIAGNOSIS — M549 Dorsalgia, unspecified: Secondary | ICD-10-CM | POA: Diagnosis not present

## 2016-12-10 DIAGNOSIS — K0889 Other specified disorders of teeth and supporting structures: Secondary | ICD-10-CM

## 2016-12-10 NOTE — MAU Note (Signed)
Pt c/o teeth hurting since yesterday that is making her face hurt. States she has a hole in one of her molars. Has taken ibuprofen for pain-last took at 11pm-has not helped. Rates 10/10. Also lower back pain that started yesterday. Pt denies vaginal bleeding or abnormal discharge.

## 2016-12-11 ENCOUNTER — Encounter (HOSPITAL_COMMUNITY): Payer: Self-pay | Admitting: *Deleted

## 2016-12-11 DIAGNOSIS — K0889 Other specified disorders of teeth and supporting structures: Secondary | ICD-10-CM

## 2016-12-11 DIAGNOSIS — Z3A20 20 weeks gestation of pregnancy: Secondary | ICD-10-CM | POA: Diagnosis not present

## 2016-12-11 DIAGNOSIS — O26892 Other specified pregnancy related conditions, second trimester: Secondary | ICD-10-CM

## 2016-12-11 DIAGNOSIS — M549 Dorsalgia, unspecified: Secondary | ICD-10-CM

## 2016-12-11 LAB — URINALYSIS, ROUTINE W REFLEX MICROSCOPIC
Bilirubin Urine: NEGATIVE
GLUCOSE, UA: NEGATIVE mg/dL
Hgb urine dipstick: NEGATIVE
KETONES UR: NEGATIVE mg/dL
Leukocytes, UA: NEGATIVE
Nitrite: NEGATIVE
PROTEIN: 30 mg/dL — AB
Specific Gravity, Urine: 1.029 (ref 1.005–1.030)
pH: 5 (ref 5.0–8.0)

## 2016-12-11 MED ORDER — ACETAMINOPHEN 500 MG PO TABS
1000.0000 mg | ORAL_TABLET | Freq: Four times a day (QID) | ORAL | Status: DC | PRN
  Administered 2016-12-11: 1000 mg via ORAL
  Filled 2016-12-11: qty 2

## 2016-12-11 NOTE — Discharge Instructions (Signed)
Back Pain in Pregnancy Back pain during pregnancy is common. Back pain may be caused by several factors that are related to changes during your pregnancy. Follow these instructions at home: Managing pain, stiffness, and swelling  If directed, apply ice for sudden (acute) back pain. ? Put ice in a plastic bag. ? Place a towel between your skin and the bag. ? Leave the ice on for 20 minutes, 2-3 times per day.  If directed, apply heat to the affected area before you exercise: ? Place a towel between your skin and the heat pack or heating pad. ? Leave the heat on for 20-30 minutes. ? Remove the heat if your skin turns bright red. This is especially important if you are unable to feel pain, heat, or cold. You may have a greater risk of getting burned. Activity  Exercise as told by your health care provider. Exercising is the best way to prevent or manage back pain.  Listen to your body when lifting. If lifting hurts, ask for help or bend your knees. This uses your leg muscles instead of your back muscles.  Squat down when picking up something from the floor. Do not bend over.  Only use bed rest as told by your health care provider. Bed rest should only be used for the most severe episodes of back pain. Standing, Sitting, and Lying Down  Do not stand in one place for long periods of time.  Use good posture when sitting. Make sure your head rests over your shoulders and is not hanging forward. Use a pillow on your lower back if necessary.  Try sleeping on your side, preferably the left side, with a pillow or two between your legs. If you are sore after a night's rest, your bed may be too soft. A firm mattress may provide more support for your back during pregnancy. General instructions  Do not wear high heels.  Eat a healthy diet. Try to gain weight within your health care provider's recommendations.  Use a maternity girdle, elastic sling, or back brace as told by your health care  provider.  Take over-the-counter and prescription medicines only as told by your health care provider.  Keep all follow-up visits as told by your health care provider. This is important. This includes any visits with any specialists, such as a physical therapist. Contact a health care provider if:  Your back pain interferes with your daily activities.  You have increasing pain in other parts of your body. Get help right away if:  You develop numbness, tingling, weakness, or problems with the use of your arms or legs.  You develop severe back pain that is not controlled with medicine.  You have a sudden change in bowel or bladder control.  You develop shortness of breath, dizziness, or you faint.  You develop nausea, vomiting, or sweating.  You have back pain that is a rhythmic, cramping pain similar to labor pains. Labor pain is usually 1-2 minutes apart, lasts for about 1 minute, and involves a bearing down feeling or pressure in your pelvis.  You have back pain and your water breaks or you have vaginal bleeding.  You have back pain or numbness that travels down your leg.  Your back pain developed after you fell.  You develop pain on one side of your back.  You see blood in your urine.  You develop skin blisters in the area of your back pain. This information is not intended to replace advice given to you   by your health care provider. Make sure you discuss any questions you have with your health care provider. Document Released: 07/04/2005 Document Revised: 09/01/2015 Document Reviewed: 12/08/2014 Elsevier Interactive Patient Education  2018 [employer] Dental Pain Dental pain may be caused by many things, including:  Tooth decay (cavities or caries). Cavities cause the nerve of your tooth to be open to air and hot or cold temperatures. This can cause pain or discomfort.  Abscess or infection. A dental abscess is an area that is full of infected pus from a bacterial  infection in the inner part of the tooth (pulp). It usually happens at the end of the tooths root.  Injury.  An unknown reason (idiopathic).  Your pain may be mild or severe. It may only happen when:  You are chewing.  You are exposed to hot or cold temperature.  You are eating or drinking sugary foods or beverages, such as: ? Soda. ? Candy.  Your pain may also be there all of the time. Follow these instructions at home: Watch your dental pain for any changes. Do these things to lessen your discomfort:  Take medicines only as told by your dentist.  If your dentist tells you to take an antibiotic medicine, finish all of it even if you start to feel better.  Keep all follow-up visits as told by your dentist. This is important.  Do not apply heat to the outside of your face.  Rinse your mouth or gargle with salt water if told by your dentist. This helps with pain and swelling. ? You can make salt water by adding  tsp of salt to 1 cup of warm water.  Apply ice to the painful area of your face: ? Put ice in a plastic bag. ? Place a towel between your skin and the bag. ? Leave the ice on for 20 minutes, 2-3 times per day.  Avoid foods or drinks that cause you pain, such as: ? Very hot or very cold foods or drinks. ? Sweet or sugary foods or drinks.  Contact a doctor if:  Your pain is not helped with medicines.  Your symptoms are worse.  You have new symptoms. Get help right away if:  You cannot open your mouth.  You are having trouble breathing or swallowing.  You have a fever.  Your face, neck, or jaw is puffy (swollen). This information is not intended to replace advice given to you by your health care provider. Make sure you discuss any questions you have with your health care provider. Document Released: 09/12/2007 Document Revised: 09/01/2015 Document Reviewed: 03/22/2014 Elsevier Interactive Patient Education  [employer]2018 [employer]

## 2016-12-11 NOTE — MAU Provider Note (Signed)
History     CSN: [id number]  Arrival date and time: 12/10/16 2333   First Provider Initiated Contact with Patient 12/11/16 0023      Chief Complaint  Patient presents with  . Dental Pain   [id number] @20 .4 wks here with tooth pain and back pain. Tooth pain is left lower and started yesterday. She took Ibuprofen and had little relief. Using ice pack currently. She reports losing a filling in that tooth last year and tooth needs to be pulled but she hasn't been back. She has a primary dentist. Her back pain started yesterday. She thinks she has a UTI because her urine is dark and cloudy. Back pain is low in lumbar region and center. She denies fever. No urinary sx except polyuria which is ongoing since early pregnancy. Had 1-2 bottles of water today.    Past Medical History:  Diagnosis Date  . Bacterial vaginosis 08/15/2016  . Headache(784.0)   . Hypertension   . Incompetent cervix   . Preterm labor   . Urinary tract infection     Past Surgical History:  Procedure Laterality Date  . BREAST FIBROADENOMA SURGERY  2006-7.   Removal;right  . CESAREAN SECTION    . DILATION AND CURETTAGE OF UTERUS    . THERAPEUTIC ABORTION      Family History  Problem Relation Age of Onset  . Diabetes Mother   . Diabetes Maternal Grandmother   . Hypertension Maternal Grandmother   . Cancer Maternal Grandmother        colon cancer, brain tumour  . Other Neg Hx     Social History  Substance Use Topics  . Smoking status: Former Smoker    Packs/day: 0.25    Types: Cigarettes    Quit date: 08/17/2016  . Smokeless tobacco: Former Neurosurgeon  . Alcohol use No    Allergies: No Known Allergies  Prescriptions Prior to Admission  Medication Sig Dispense Refill Last Dose  . cyclobenzaprine (FLEXERIL) 5 MG tablet Take 5 mg by mouth 3 (three) times daily as needed for muscle spasms.   12/10/2016 at Unknown time  . ibuprofen (ADVIL,MOTRIN) 600 MG tablet Take 1 tablet (600 mg total) by mouth every 6 (six) hours  as needed. 10 tablet 0 12/11/2016 at Unknown time  . Prenat w/o A-FeCbn-Meth-FA-DHA (PRENATE MINI) 29-0.6-0.4-350 MG CAPS Take 1 capsule by mouth daily before breakfast. 90 capsule 4 More than a month at Unknown time  . Prenatal Vit-Fe Fumarate-FA (PRENATAL VITAMIN) 27-0.8 MG TABS Take 1 tablet by mouth daily. (Patient not taking: Reported on 09/26/2016) 30 tablet 3 Not Taking at Unknown time    Review of Systems  Constitutional: Negative for fever.  HENT: Positive for dental problem.   Gastrointestinal: Negative for abdominal pain.  Genitourinary: Positive for frequency. Negative for dysuria, hematuria, urgency and vaginal bleeding.  Musculoskeletal: Positive for back pain.   Physical Exam   Blood pressure 118/63, pulse 86, temperature 98.3 F (36.8 C), resp. rate 16, height 5\' 3"  (1.6 m), weight 239 lb (108.4 kg), last menstrual period 07/11/2016, SpO2 100 %, unknown if currently breastfeeding.  Physical Exam  Constitutional: She is oriented to person, place, and time. She appears well-developed and well-nourished. No distress.  HENT:  Head: Normocephalic and atraumatic.  Mouth/Throat: Uvula is midline, oropharynx is clear and moist and mucous membranes are normal. No oral lesions. Dental caries present. No dental abscesses or lacerations.    Multiple dental caries, no erythema or edema  Neck: Normal range of motion.  Cardiovascular: Normal rate.   Respiratory: Effort normal.  Musculoskeletal: Normal range of motion.  Neurological: She is alert and oriented to person, place, and time.  Skin: Skin is warm and dry.  FHT 155  Results for orders placed or performed during the hospital encounter of 12/10/16 (from the past 24 hour(s))  Urinalysis, Routine w reflex microscopic     Status: Abnormal   Collection Time: 12/10/16 11:58 PM  Result Value Ref Range   Color, Urine YELLOW YELLOW   APPearance HAZY (A) CLEAR   Specific Gravity, Urine 1.029 1.005 - 1.030   pH 5.0 5.0 - 8.0    Glucose, UA NEGATIVE NEGATIVE mg/dL   Hgb urine dipstick NEGATIVE NEGATIVE   Bilirubin Urine NEGATIVE NEGATIVE   Ketones, ur NEGATIVE NEGATIVE mg/dL   Protein, ur 30 (A) NEGATIVE mg/dL   Nitrite NEGATIVE NEGATIVE   Leukocytes, UA NEGATIVE NEGATIVE   RBC / HPF 0-5 0 - 5 RBC/hpf   WBC, UA 0-5 0 - 5 WBC/hpf   Bacteria, UA RARE (A) NONE SEEN   Squamous Epithelial / LPF 0-5 (A) NONE SEEN   Mucus PRESENT    Ca Oxalate Crys, UA PRESENT    MAU Course  Procedures Tylenol MDM Labs ordered and reviewed. No evidence of UTI, back pain and dark likely dehydration- high SG on UA. Pt reports she is on antibiotic for UTI, no record of this was found in her file. No abscess detected on oral exam, will need to see dentist this week. Stable for discharge home.   Assessment and Plan   1. [redacted] weeks gestation of pregnancy   2. Tooth pain   3. Back pain affecting pregnancy in second trimester    Discharge home Follow up with Dr. Chilton SiGreen- dentist this week Increase water intake to 5-6 bottles per day Tylenol prn pain  Allergies as of 12/11/2016   No Known Allergies     Medication List    STOP taking these medications   ibuprofen 600 MG tablet Commonly known as:  ADVIL,MOTRIN   Prenatal Vitamin 27-0.8 MG Tabs     TAKE these medications   cyclobenzaprine 5 MG tablet Commonly known as:  FLEXERIL Take 5 mg by mouth 3 (three) times daily as needed for muscle spasms.   PRENATE MINI 29-0.6-0.4-350 MG Caps Take 1 capsule by mouth daily before breakfast.            Discharge Care Instructions        Start     Ordered   12/11/16 0000  Discharge patient    Question Answer Comment  Discharge disposition 01-Home or Self Care   Discharge patient date 12/11/2016      12/11/16 [id number]      Donette LarryMelanie Xitlaly Ault, CNM 12/11/2016, 12:31 AM

## 2016-12-13 ENCOUNTER — Inpatient Hospital Stay (HOSPITAL_COMMUNITY)
Admission: AD | Admit: 2016-12-13 | Discharge: 2016-12-13 | Disposition: A | Discharge status: Home / Self Care | Payer: Medicaid Other | Source: Ambulatory Visit | Attending: Obstetrics and Gynecology | Admitting: Obstetrics and Gynecology

## 2016-12-13 ENCOUNTER — Encounter (HOSPITAL_COMMUNITY): Payer: Self-pay | Admitting: *Deleted

## 2016-12-13 DIAGNOSIS — Z87891 Personal history of nicotine dependence: Secondary | ICD-10-CM | POA: Diagnosis not present

## 2016-12-13 DIAGNOSIS — Z3A20 20 weeks gestation of pregnancy: Secondary | ICD-10-CM | POA: Diagnosis not present

## 2016-12-13 DIAGNOSIS — T6191XA Toxic effect of unspecified seafood, accidental (unintentional), initial encounter: Secondary | ICD-10-CM | POA: Insufficient documentation

## 2016-12-13 DIAGNOSIS — T6291XA Toxic effect of unspecified noxious substance eaten as food, accidental (unintentional), initial encounter: Secondary | ICD-10-CM | POA: Diagnosis not present

## 2016-12-13 DIAGNOSIS — R109 Unspecified abdominal pain: Secondary | ICD-10-CM | POA: Diagnosis not present

## 2016-12-13 DIAGNOSIS — O9989 Other specified diseases and conditions complicating pregnancy, childbirth and the puerperium: Secondary | ICD-10-CM

## 2016-12-13 DIAGNOSIS — Z3480 Encounter for supervision of other normal pregnancy, unspecified trimester: Secondary | ICD-10-CM

## 2016-12-13 DIAGNOSIS — O26892 Other specified pregnancy related conditions, second trimester: Secondary | ICD-10-CM | POA: Diagnosis not present

## 2016-12-13 DIAGNOSIS — IMO0001 Reserved for inherently not codable concepts without codable children: Secondary | ICD-10-CM

## 2016-12-13 LAB — URINALYSIS, ROUTINE W REFLEX MICROSCOPIC
Bacteria, UA: NONE SEEN
Bilirubin Urine: NEGATIVE
Glucose, UA: NEGATIVE mg/dL
Hgb urine dipstick: NEGATIVE
Ketones, ur: NEGATIVE mg/dL
Leukocytes, UA: NEGATIVE
Nitrite: NEGATIVE
Protein, ur: 30 mg/dL — AB
Specific Gravity, Urine: 1.024 (ref 1.005–1.030)
pH: 5 (ref 5.0–8.0)

## 2016-12-13 LAB — AFP, SERUM, OPEN SPINA BIFIDA
AFP MoM: 0.57
AFP Value: 37.3 ng/mL
GEST. AGE ON COLLECTION DATE: 20 wk
Maternal Age At EDD: 28.5 yr
OSBR RISK 1 IN: 10000
Test Results:: NEGATIVE
WEIGHT: 136 [lb_av]

## 2016-12-13 NOTE — MAU Note (Signed)
Pt. Complains of abdominal pain and throwing up each time she tries to eat food. Pain and throwing up started at about 1915 last night. Pt. States she ate fish and that started the emesis episodes.

## 2016-12-13 NOTE — Discharge Instructions (Signed)

## 2016-12-13 NOTE — MAU Provider Note (Signed)
Chief Complaint:  Abdominal Pain and Emesis   First Provider Initiated Contact with Patient 12/13/16 1323     HPI: [name] is a 26 y.o. [id number] at 8263w6d who presents to maternity admissions reporting abdominal pain and vomiting. Pt reports that she ate some bad fish last night around 5pm and at 7:17 pm she started having "crampy" abdominal pain with nausea and vomiting. She notes that the pain is in the upper abdomen and periumbilical areas. She has vomited a total of 4 times since last night, mainly after eating or drinking. She reports vomiting up stomach contents and bile. She vomited twice this morning after drinking sprite and again after eating a sausage biscuit. Today she says that the pain has extended to the top of her thighs. She has not taken any medication for her symptoms. She reports a mild headache. She denies, blurry vision, dizziness, diarrhea, constipation, fever. Denies leakage of fluid or vaginal bleeding.    Past Medical History: Past Medical History:  Diagnosis Date  . Bacterial vaginosis 08/15/2016  . Headache(784.0)   . Hypertension   . Incompetent cervix   . Preterm labor   . Urinary tract infection     Past obstetric history: OB History  Gravida Para Term Preterm AB Living  5 1   1 3 2   SAB TAB Ectopic Multiple Live Births  2 1   1 2     # Outcome Date GA Lbr Len/2nd Weight Sex Delivery Anes PTL Lv  5 Current           4 SAB 07/2013 1970w0d         3 TAB 03/13/12 8542w0d         2 SAB 01/08/12 9025w0d         1A Preterm 05/17/10 3070w0d   F CS-LTranv Spinal Y LIV     Complications: Preeclampsia     Birth Comments: PIH  1B Preterm 05/17/10 4270w0d   M  Spinal Y LIV     Complications: Preeclampsia    Obstetric Comments  D&C    Past Surgical History: Past Surgical History:  Procedure Laterality Date  . BREAST FIBROADENOMA SURGERY  2006-7.   Removal;right  . CESAREAN SECTION    . DILATION AND CURETTAGE OF UTERUS    . THERAPEUTIC ABORTION       Family  History: Family History  Problem Relation Age of Onset  . Diabetes Mother   . Diabetes Maternal Grandmother   . Hypertension Maternal Grandmother   . Cancer Maternal Grandmother        colon cancer, brain tumour  . Other Neg Hx     Social History: Social History  Substance Use Topics  . Smoking status: Former Smoker    Packs/day: 0.25    Types: Cigarettes    Quit date: 08/17/2016  . Smokeless tobacco: Former NeurosurgeonUser  . Alcohol use No    Allergies: No Known Allergies  Meds:  No prescriptions prior to admission.    I have reviewed patient's Past Medical Hx, Surgical Hx, Family Hx, Social Hx, medications and allergies.   ROS:  Review of Systems  Constitutional: Negative for appetite change and fever.  HENT: Negative.   Respiratory: Negative.   Cardiovascular: Negative.   Gastrointestinal: Positive for abdominal pain, nausea and vomiting. Negative for blood in stool, constipation and diarrhea.  Genitourinary: Negative.   Musculoskeletal: Negative.   Neurological: Negative.     Physical Exam  Patient Vitals for the past 24 hrs:  BP Temp Temp src Pulse Resp Height Weight  12/13/16 1234 (!) 104/53 98.3 F (36.8 C) Oral 94 16  (1.626 m) 108.4 kg (239 lb)   Constitutional: Well-developed, well-nourished female in no acute distress.  Cardiovascular: normal rate Respiratory: normal effort GI: Abd soft, slightly tender, periumbilical pain, gravid appropriate for gestational age. MS: Extremities nontender, no edema, normal ROM Neurologic: Alert and oriented x 4.  Pelvic: pelvic exam deffered Skin: dry, and intact  FHT:  Baseline 158   Labs: Results for orders placed or performed during the hospital encounter of 12/13/16 (from the past 24 hour(s))  Urinalysis, Routine w reflex microscopic     Status: Abnormal   Collection Time: 12/13/16 12:31 PM  Result Value Ref Range   Color, Urine YELLOW YELLOW   APPearance HAZY (A) CLEAR   Specific Gravity, Urine 1.024 1.005 -  1.030   pH 5.0 5.0 - 8.0   Glucose, UA NEGATIVE NEGATIVE mg/dL   Hgb urine dipstick NEGATIVE NEGATIVE   Bilirubin Urine NEGATIVE NEGATIVE   Ketones, ur NEGATIVE NEGATIVE mg/dL   Protein, ur 30 (A) NEGATIVE mg/dL   Nitrite NEGATIVE NEGATIVE   Leukocytes, UA NEGATIVE NEGATIVE   RBC / HPF 0-5 0 - 5 RBC/hpf   WBC, UA 0-5 0 - 5 WBC/hpf   Bacteria, UA NONE SEEN NONE SEEN   Squamous Epithelial / LPF 0-5 (A) NONE SEEN   Mucus PRESENT    Hyaline Casts, UA PRESENT     Imaging:  MAU Course:  MDM:  Assessment/Plan:  1. Food poisoning, accidental or unintentional, initial encounter   2. Supervision of other normal pregnancy, antepartum     Next prenateal visit scheduled for 12/22/16  2. Abdominal pain and emesis Based on her hx and exam, it is likely that pt is experiencing a bout of food poisoning. Patient was told that she may experience diarrhea while her body tries to clear the bug and that there really isn't much that can be prescribed for her. She was also encouraged to eat a BRAT diet until she can keep food down regularly. Patient understood and agreed with this plan.Patient to return to MAU if she develops fever, bloody diarrhea, chills, or if she is not able to keep down small meals in 48 hours.  Patient desires to be discharged to pick up her children without receiving any IV fluids or nausea medications.   Return precautions given. Discharge home in stable condition.     Julieanne Cotton T, Medical Student 12/13/2016 1:31 PM    I confirm that I have verified the information documented in the student's note and that I have also personally reperformed the physical exam and all medical decision making activities.   Luna Kitchens CNM

## 2016-12-14 ENCOUNTER — Other Ambulatory Visit: Payer: Self-pay | Admitting: Certified Nurse Midwife

## 2016-12-14 DIAGNOSIS — Z3480 Encounter for supervision of other normal pregnancy, unspecified trimester: Secondary | ICD-10-CM

## 2016-12-21 ENCOUNTER — Ambulatory Visit (HOSPITAL_COMMUNITY)
Admission: RE | Admit: 2016-12-21 | Discharge: 2016-12-21 | Disposition: A | Discharge status: Home / Self Care | Payer: Medicaid Other | Source: Ambulatory Visit | Attending: Certified Nurse Midwife | Admitting: Certified Nurse Midwife

## 2016-12-21 ENCOUNTER — Other Ambulatory Visit: Payer: Self-pay | Admitting: Certified Nurse Midwife

## 2016-12-21 DIAGNOSIS — Z3A22 22 weeks gestation of pregnancy: Secondary | ICD-10-CM

## 2016-12-21 DIAGNOSIS — O99212 Obesity complicating pregnancy, second trimester: Secondary | ICD-10-CM | POA: Insufficient documentation

## 2016-12-21 DIAGNOSIS — Z3A23 23 weeks gestation of pregnancy: Secondary | ICD-10-CM | POA: Diagnosis not present

## 2016-12-21 DIAGNOSIS — IMO0002 Reserved for concepts with insufficient information to code with codable children: Secondary | ICD-10-CM

## 2016-12-21 DIAGNOSIS — Z362 Encounter for other antenatal screening follow-up: Secondary | ICD-10-CM | POA: Insufficient documentation

## 2016-12-21 DIAGNOSIS — Z0489 Encounter for examination and observation for other specified reasons: Secondary | ICD-10-CM

## 2016-12-21 DIAGNOSIS — O3432 Maternal care for cervical incompetence, second trimester: Secondary | ICD-10-CM | POA: Diagnosis not present

## 2016-12-21 DIAGNOSIS — O09292 Supervision of pregnancy with other poor reproductive or obstetric history, second trimester: Secondary | ICD-10-CM | POA: Diagnosis not present

## 2016-12-21 DIAGNOSIS — O34219 Maternal care for unspecified type scar from previous cesarean delivery: Secondary | ICD-10-CM | POA: Insufficient documentation

## 2016-12-21 DIAGNOSIS — Z3A01 Less than 8 weeks gestation of pregnancy: Secondary | ICD-10-CM

## 2016-12-21 DIAGNOSIS — Z3480 Encounter for supervision of other normal pregnancy, unspecified trimester: Secondary | ICD-10-CM

## 2016-12-21 DIAGNOSIS — O09219 Supervision of pregnancy with history of pre-term labor, unspecified trimester: Secondary | ICD-10-CM | POA: Diagnosis not present

## 2016-12-21 DIAGNOSIS — Z6841 Body Mass Index (BMI) 40.0 and over, adult: Secondary | ICD-10-CM | POA: Diagnosis not present

## 2016-12-21 NOTE — Addendum Note (Signed)
Encounter addended by: Vanetta Shawl, RT, RVT, RDMS on: 12/21/2016 10:02 AM<BR>    Actions taken: Imaging Exam ended

## 2016-12-25 ENCOUNTER — Other Ambulatory Visit: Payer: Self-pay | Admitting: Certified Nurse Midwife

## 2016-12-25 DIAGNOSIS — Z3480 Encounter for supervision of other normal pregnancy, unspecified trimester: Secondary | ICD-10-CM

## 2016-12-27 ENCOUNTER — Inpatient Hospital Stay (HOSPITAL_COMMUNITY)
Admission: AD | Admit: 2016-12-27 | Discharge: 2016-12-27 | Disposition: A | Discharge status: Home / Self Care | Payer: Medicaid Other | Source: Ambulatory Visit | Attending: Obstetrics and Gynecology | Admitting: Obstetrics and Gynecology

## 2016-12-27 ENCOUNTER — Encounter (HOSPITAL_COMMUNITY): Payer: Self-pay | Admitting: *Deleted

## 2016-12-27 DIAGNOSIS — R1030 Lower abdominal pain, unspecified: Secondary | ICD-10-CM | POA: Insufficient documentation

## 2016-12-27 DIAGNOSIS — O26892 Other specified pregnancy related conditions, second trimester: Secondary | ICD-10-CM | POA: Diagnosis present

## 2016-12-27 DIAGNOSIS — B9689 Other specified bacterial agents as the cause of diseases classified elsewhere: Secondary | ICD-10-CM

## 2016-12-27 DIAGNOSIS — O9989 Other specified diseases and conditions complicating pregnancy, childbirth and the puerperium: Secondary | ICD-10-CM | POA: Diagnosis not present

## 2016-12-27 DIAGNOSIS — N760 Acute vaginitis: Secondary | ICD-10-CM

## 2016-12-27 DIAGNOSIS — M545 Low back pain: Secondary | ICD-10-CM | POA: Insufficient documentation

## 2016-12-27 DIAGNOSIS — O219 Vomiting of pregnancy, unspecified: Secondary | ICD-10-CM | POA: Diagnosis not present

## 2016-12-27 DIAGNOSIS — Z3A22 22 weeks gestation of pregnancy: Secondary | ICD-10-CM | POA: Insufficient documentation

## 2016-12-27 DIAGNOSIS — O212 Late vomiting of pregnancy: Secondary | ICD-10-CM | POA: Insufficient documentation

## 2016-12-27 DIAGNOSIS — Z87891 Personal history of nicotine dependence: Secondary | ICD-10-CM | POA: Diagnosis not present

## 2016-12-27 LAB — CBC
HEMATOCRIT: 32.1 % — AB (ref 36.0–46.0)
Hemoglobin: 11 g/dL — ABNORMAL LOW (ref 12.0–15.0)
MCH: 29.7 pg (ref 26.0–34.0)
MCHC: 34.3 g/dL (ref 30.0–36.0)
MCV: 86.8 fL (ref 78.0–100.0)
Platelets: 253 10*3/uL (ref 150–400)
RBC: 3.7 MIL/uL — ABNORMAL LOW (ref 3.87–5.11)
RDW: 13.3 % (ref 11.5–15.5)
WBC: 6.9 10*3/uL (ref 4.0–10.5)

## 2016-12-27 LAB — URINALYSIS, ROUTINE W REFLEX MICROSCOPIC
BILIRUBIN URINE: NEGATIVE
GLUCOSE, UA: NEGATIVE mg/dL
Hgb urine dipstick: NEGATIVE
Ketones, ur: NEGATIVE mg/dL
Nitrite: NEGATIVE
Protein, ur: 30 mg/dL — AB
SPECIFIC GRAVITY, URINE: 1.017 (ref 1.005–1.030)
pH: 5 (ref 5.0–8.0)

## 2016-12-27 LAB — WET PREP, GENITAL
Sperm: NONE SEEN
Trich, Wet Prep: NONE SEEN
Yeast Wet Prep HPF POC: NONE SEEN

## 2016-12-27 MED ORDER — METRONIDAZOLE 500 MG PO TABS: 500.0000 mg | ORAL_TABLET | Freq: Two times a day (BID) | ORAL | Status: DC

## 2016-12-27 MED ORDER — DOXYLAMINE-PYRIDOXINE 10-10 MG PO TBEC
DELAYED_RELEASE_TABLET | ORAL | 6 refills | Status: DC
Start: 2016-12-27 — End: 2017-01-04

## 2016-12-27 MED ORDER — METRONIDAZOLE 500 MG PO TABS: 500.0000 mg | ORAL_TABLET | Freq: Two times a day (BID) | ORAL | 0 refills | Status: DC

## 2016-12-27 MED ORDER — PROMETHAZINE HCL 25 MG PO TABS: 25.0000 mg | ORAL_TABLET | Freq: Four times a day (QID) | ORAL | 2 refills | Status: DC | PRN

## 2016-12-27 MED ORDER — SODIUM CHLORIDE 0.9 % IV SOLN
25.0000 mg | Freq: Once | INTRAVENOUS | Status: AC
  Administered 2016-12-27: 25 mg via INTRAVENOUS
  Filled 2016-12-27: qty 1

## 2016-12-27 NOTE — MAU Provider Note (Signed)
History     CSN: [id number]  Arrival date and time: 12/27/16 1191   None     Chief Complaint  Patient presents with  . Emesis  . Abdominal Pain  . Back Pain   HPI 26 yo G0132 at [redacted]w[redacted]d here for the evaluation of nausea and vomiting as well as lower back pain. Patient reports onset of these symptoms for the past few weeks. It was originally thought to be food poisioning but the symptoms persisted. She reports the development of intermittent lower back pain and lower abdominal pain. She denies any abnormal discharge. She denies any dysuria.  OB History    Gravida Para Term Preterm AB Living   SAB TAB Ectopic Multiple Live Births   Obstetric Comments   D&C      Past Medical History:  Diagnosis Date  . Bacterial vaginosis 08/15/2016  . Headache(784.0)   . Hypertension   . Incompetent cervix   . Preterm labor   . Urinary tract infection     Past Surgical History:  Procedure Laterality Date  . BREAST FIBROADENOMA SURGERY  2006-7.   Removal;right  . CESAREAN SECTION    . DILATION AND CURETTAGE OF UTERUS    . THERAPEUTIC ABORTION      Family History  Problem Relation Age of Onset  . Diabetes Mother   . Diabetes Maternal Grandmother   . Hypertension Maternal Grandmother   . Cancer Maternal Grandmother        colon cancer, brain tumour  . Other Neg Hx     Social History  Substance Use Topics  . Smoking status: Former Smoker    Packs/day: 0.25    Types: Cigarettes    Quit date: 08/17/2016  . Smokeless tobacco: Former Neurosurgeon  . Alcohol use No    Allergies: No Known Allergies  Prescriptions Prior to Admission  Medication Sig Dispense Refill Last Dose  . Prenat w/o A-FeCbn-Meth-FA-DHA (PRENATE MINI) 29-0.6-0.4-350 MG CAPS Take 1 capsule by mouth daily before breakfast. (Patient not taking: Reported on 12/27/2016) 90 capsule 4 Not Taking at Unknown time    Review of Systems  See pertinent in HPI Physical Exam   Blood pressure (!)  92/56, pulse 98, temperature 98.2 F (36.8 C), temperature source Oral, resp. rate 16, last menstrual period 07/11/2016, unknown if currently breastfeeding.  Physical Exam GENERAL: Well-developed, well-nourished female in no acute distress.  LUNGS: Clear to auscultation bilaterally.  HEART: Regular rate and rhythm. ABDOMEN: Soft, nontender, gravid PELVIC: Normal external female genitalia. Vagina is pink and rugated.  Normal discharge. Normal appearing cervix- closed/thick and long. Copious white discharge EXTREMITIES: No cyanosis, clubbing, or edema, 2+ distal pulses.  MAU Course  Procedures  MDM Results for orders placed or performed during the hospital encounter of 12/27/16 (from the past 24 hour(s))  Urinalysis, Routine w reflex microscopic     Status: Abnormal   Collection Time: 12/27/16  8:47 AM  Result Value Ref Range   Color, Urine YELLOW YELLOW   APPearance CLOUDY (A) CLEAR   Specific Gravity, Urine 1.017 1.005 - 1.030   pH 5.0 5.0 - 8.0   Glucose, UA NEGATIVE NEGATIVE mg/dL   Hgb urine dipstick NEGATIVE NEGATIVE   Bilirubin Urine NEGATIVE NEGATIVE   Ketones, ur NEGATIVE NEGATIVE mg/dL   Protein, ur 30 (A) NEGATIVE mg/dL   Nitrite NEGATIVE NEGATIVE   Leukocytes, UA SMALL (A) NEGATIVE  RBC / HPF 0-5 0 - 5 RBC/hpf   WBC, UA 6-30 0 - 5 WBC/hpf   Bacteria, UA MANY (A) NONE SEEN   Squamous Epithelial / LPF TOO NUMEROUS TO COUNT (A) NONE SEEN   Mucus PRESENT   Wet prep, genital     Status: Abnormal   Collection Time: 12/27/16  9:00 AM  Result Value Ref Range   Yeast Wet Prep HPF POC NONE SEEN NONE SEEN   Trich, Wet Prep NONE SEEN NONE SEEN   Clue Cells Wet Prep HPF POC PRESENT (A) NONE SEEN   WBC, Wet Prep HPF POC MANY (A) NONE SEEN   Sperm NONE SEEN   CBC     Status: Abnormal   Collection Time: 12/27/16  9:30 AM  Result Value Ref Range   WBC 6.9 4.0 - 10.5 K/uL   RBC 3.70 (L) 3.87 - 5.11 MIL/uL   Hemoglobin 11.0 (L) 12.0 - 15.0 g/dL   HCT 16.1 (L) 09.6 - 04.5 %    MCV 86.8 78.0 - 100.0 fL   MCH 29.7 26.0 - 34.0 pg   MCHC 34.3 30.0 - 36.0 g/dL   RDW 40.9 81.1 - 91.4 %   Platelets 253 150 - 400 K/uL    Assessment and Plan  26 yo [id number] with nausea/emesis in pregnancy and BV - Rx diclegis provided and phenergan - Rx flagyl provided. Patient advised to contact office for vaginal suppository if unable to tolerate po flagyl - Follow up as scheduled on 9/28 for ROB - Precautions reviewed  Dorina Ribaudo 12/27/2016, 10:33 AM

## 2016-12-27 NOTE — Discharge Instructions (Signed)
Eating Plan for Hyperemesis Gravidarum Hyperemesis gravidarum is a severe form of morning sickness. Because this condition causes severe nausea and vomiting, it can lead to dehydration, malnutrition, and weight loss. One way to lessen the symptoms of nausea and vomiting is to follow the eating plan for hyperemesis gravidarum. It is often used along with prescribed medicines to control your symptoms. What can I do to relieve my symptoms? Listen to your body. Everyone is different and has different preferences. Find what works best for you. Take any of the following actions that are helpful to you:  Eat and drink slowly.  Eat 5-6 small meals daily instead of 3 large meals.  Eat crackers before you get out of bed in the morning.  Try having a snack in the middle of the night.  Starchy foods are usually tolerated well. Examples include cereal, toast, bread, potatoes, pasta, rice, and pretzels.  Ginger may help with nausea. Add  tsp ground ginger to hot tea or choose ginger tea.  Try drinking 100% fruit juice or an electrolyte drink. An electrolyte drink contains sodium, potassium, and chloride.  Continue to take your prenatal vitamins as told by your health care provider. If you are having trouble taking your prenatal vitamins, talk with your health care provider about different options.  Include at least 1 serving of protein with your meals and snacks. Protein options include meats or poultry, beans, nuts, eggs, and yogurt. Try eating a protein-rich snack before bed. Examples of these snacks include cheese and crackers or half of a peanut butter or [place] sandwich.  Consider eliminating foods that trigger your symptoms. These may include spicy foods, coffee, high-fat foods, very sweet foods, and acidic foods.  Try meals that have more protein combined with bland, salty, lower-fat, and dry foods, such as nuts, seeds, pretzels, crackers, and cereal.  Talk with your healthcare provider about  starting a supplement of vitamin B6.  Have fluids that are cold, clear, and carbonated or sour. Examples include lemonade, ginger ale, lemon-lime soda, ice water, and sparkling water.  Try lemon or mint tea.  Try brushing your teeth or using a mouth rinse after meals.  What should I avoid to reduce my symptoms? Avoiding some of the following things may help reduce your symptoms.  Foods with strong smells. Try eating meals in well-ventilated areas that are free of odors.  Drinking water or other beverages with meals. Try not to drink anything during the 30 minutes before and after your meals.  Drinking more than 1 cup of fluid at a time. Sometimes using a straw helps.  Fried or high-fat foods, such as butter and cream sauces.  Spicy foods.  Skipping meals as best as you can. Nausea can be more intense on an empty stomach. If you cannot tolerate food at that time, do not force it. Try sucking on ice chips or other frozen items, and make up for missed calories later.  Lying down within 2 hours after eating.  Environmental triggers. These may include smoky rooms, closed spaces, rooms with strong smells, warm or humid places, overly loud and noisy rooms, and rooms with motion or flickering lights.  Quick and sudden changes in your movement.  This information is not intended to replace advice given to you by your health care provider. Make sure you discuss any questions you have with your health care provider. Document Released: 01/21/2007 Document Revised: 11/23/2015 Document Reviewed: 10/25/2015 Elsevier Interactive Patient Education  2018 [employer].    Bacterial  Vaginosis Bacterial vaginosis is a vaginal infection that occurs when the normal balance of bacteria in the vagina is disrupted. It results from an overgrowth of certain bacteria. This is the most common vaginal infection among women ages 45-44. Because bacterial vaginosis increases your risk for STIs (sexually  transmitted infections), getting treated can help reduce your risk for chlamydia, gonorrhea, herpes, and HIV (human immunodeficiency virus). Treatment is also important for preventing complications in pregnant women, because this condition can cause an early (premature) delivery. What are the causes? This condition is caused by an increase in harmful bacteria that are normally present in small amounts in the vagina. However, the reason that the condition develops is not fully understood. What increases the risk? The following factors may make you more likely to develop this condition:  Having a new sexual partner or multiple sexual partners.  Having unprotected sex.  Douching.  Having an intrauterine device (IUD).  Smoking.  Drug and alcohol abuse.  Taking certain antibiotic medicines.  Being pregnant.  You cannot get bacterial vaginosis from toilet seats, bedding, swimming pools, or contact with objects around you. What are the signs or symptoms? Symptoms of this condition include:  Grey or white vaginal discharge. The discharge can also be watery or foamy.  A fish-like odor with discharge, especially after sexual intercourse or during menstruation.  Itching in and around the vagina.  Burning or pain with urination.  Some women with bacterial vaginosis have no signs or symptoms. How is this diagnosed? This condition is diagnosed based on:  Your medical history.  A physical exam of the vagina.  Testing a sample of vaginal fluid under a microscope to look for a large amount of bad bacteria or abnormal cells. Your health care provider may use a cotton swab or a small wooden spatula to collect the sample.  How is this treated? This condition is treated with antibiotics. These may be given as a pill, a vaginal cream, or a medicine that is put into the vagina (suppository). If the condition comes back after treatment, a second round of antibiotics may be needed. Follow these  instructions at home: Medicines  Take over-the-counter and prescription medicines only as told by your health care provider.  Take or use your antibiotic as told by your health care provider. Do not stop taking or using the antibiotic even if you start to feel better. General instructions  If you have a female sexual partner, tell her that you have a vaginal infection. She should see her health care provider and be treated if she has symptoms. If you have a female sexual partner, he does not need treatment.  During treatment: ? Avoid sexual activity until you finish treatment. ? Do not douche. ? Avoid alcohol as directed by your health care provider. ? Avoid breastfeeding as directed by your health care provider.  Drink enough water and fluids to keep your urine clear or pale yellow.  Keep the area around your vagina and rectum clean. ? Wash the area daily with warm water. ? Wipe yourself from front to back after using the toilet.  Keep all follow-up visits as told by your health care provider. This is important. How is this prevented?  Do not douche.  Wash the outside of your vagina with warm water only.  Use protection when having sex. This includes latex condoms and dental dams.  Limit how many sexual partners you have. To help prevent bacterial vaginosis, it is best to have sex with just  one partner (monogamous).  Make sure you and your sexual partner are tested for STIs.  Wear cotton or cotton-lined underwear.  Avoid wearing tight pants and pantyhose, especially during summer.  Limit the amount of alcohol that you drink.  Do not use any products that contain nicotine or tobacco, such as cigarettes and e-cigarettes. If you need help quitting, ask your health care provider.  Do not use illegal drugs. Where to find more information:  Centers for Disease Control and Prevention: SolutionApps.co.za  American Sexual Health Association (ASHA): www.ashastd.org  [place]  Department of Health and Health and safety inspector, Office on Women's Health: ConventionalMedicines.si or http://www.anderson-williamson.info/ Contact a health care provider if:  Your symptoms do not improve, even after treatment.  You have more discharge or pain when urinating.  You have a fever.  You have pain in your abdomen.  You have pain during sex.  You have vaginal bleeding between periods. Summary  Bacterial vaginosis is a vaginal infection that occurs when the normal balance of bacteria in the vagina is disrupted.  Because bacterial vaginosis increases your risk for STIs (sexually transmitted infections), getting treated can help reduce your risk for chlamydia, gonorrhea, herpes, and HIV (human immunodeficiency virus). Treatment is also important for preventing complications in pregnant women, because the condition can cause an early (premature) delivery.  This condition is treated with antibiotic medicines. These may be given as a pill, a vaginal cream, or a medicine that is put into the vagina (suppository). This information is not intended to replace advice given to you by your health care provider. Make sure you discuss any questions you have with your health care provider. Document Released: 03/26/2005 Document Revised: 12/10/2015 Document Reviewed: 12/10/2015 Elsevier Interactive Patient Education  2017 [employer].

## 2016-12-27 NOTE — MAU Note (Signed)
Pt C/O vomiting since yesterday, also lower abd pain & vaginal pressure.  Also back pain.  Denies bleeding or LOF, is having discharge.

## 2016-12-28 LAB — GC/CHLAMYDIA PROBE AMP (~~LOC~~) NOT AT ARMC
CHLAMYDIA, DNA PROBE: NEGATIVE
NEISSERIA GONORRHEA: NEGATIVE

## 2017-01-04 ENCOUNTER — Ambulatory Visit (INDEPENDENT_AMBULATORY_CARE_PROVIDER_SITE_OTHER): Payer: Medicaid Other | Admitting: Certified Nurse Midwife

## 2017-01-04 DIAGNOSIS — Z3480 Encounter for supervision of other normal pregnancy, unspecified trimester: Secondary | ICD-10-CM

## 2017-01-04 DIAGNOSIS — Z98891 History of uterine scar from previous surgery: Secondary | ICD-10-CM | POA: Insufficient documentation

## 2017-01-04 DIAGNOSIS — Z3482 Encounter for supervision of other normal pregnancy, second trimester: Secondary | ICD-10-CM

## 2017-01-04 DIAGNOSIS — O34219 Maternal care for unspecified type scar from previous cesarean delivery: Secondary | ICD-10-CM

## 2017-01-04 MED ORDER — OB COMPLETE PETITE 35-5-1-200 MG PO CAPS: 1.0000 | ORAL_CAPSULE | Freq: Every day | ORAL | 12 refills | Status: DC

## 2017-01-04 NOTE — Progress Notes (Signed)
   PRENATAL VISIT NOTE  Subjective:  [name] is a 26 y.o. [phone] at [redacted]w[redacted]d being seen today for ongoing prenatal care.  She is currently monitored for the following issues for this low-risk pregnancy and has OBESITY; TOBACCO USE, QUIT; and Supervision of other normal pregnancy, antepartum on her problem list.  Patient reports no bleeding, no contractions, no cramping, no leaking and reports chest pain with climbing stairs; went to MAU but did not stay.  Encouraged to return to MAU with chest pain!.  Contractions: Not present. Vag. Bleeding: None.  Movement: Present. Denies leaking of fluid.   The following portions of the patient's history were reviewed and updated as appropriate: allergies, current medications, past family history, past medical history, past social history, past surgical history and problem list. Problem list updated.  Objective:   Vitals:   01/04/17 0902  BP: 108/71  Pulse: 89  Weight: 243 lb 12.8 oz (110.6 kg)    Fetal Status: Fetal Heart Rate (bpm): 149; doppler Fundal Height: 32 cm Movement: Present     General:  Alert, oriented and cooperative. Patient is in no acute distress.  Skin: Skin is warm and dry. No rash noted.   Cardiovascular: Normal heart rate noted  Respiratory: Normal respiratory effort, no problems with respiration noted  Abdomen: Soft, gravid, appropriate for gestational age.  Pain/Pressure: Present     Pelvic: Cervical exam deferred        Extremities: Normal range of motion.  Edema: None  Mental Status:  Normal mood and affect. Normal behavior. Normal judgment and thought content.   Assessment and Plan:  Pregnancy: [id number] at [redacted]w[redacted]d  1. Supervision of other normal pregnancy, antepartum     Occasional chest pain of unknown origin.  Does have morbid obesity.  Needs EKG with next episode of chest pain.  Stable today.  Hx of incompetent cervix and preterm delivery via C-section with the twins.  CL: normal 3 cm on TUVS.  F/U growth at [facility]  scheduled.  - Prenat-FeCbn-FeAspGl-FA-Omega (OB COMPLETE PETITE) 35-5-1-200 MG CAPS; Take 1 tablet by mouth daily.  Dispense: 30 capsule; Refill: 12  Preterm labor symptoms and general obstetric precautions including but not limited to vaginal bleeding, contractions, leaking of fluid and fetal movement were reviewed in detail with the patient. Please refer to After Visit Summary for other counseling recommendations.  Return in about 4 weeks (around 02/01/2017) for 2 hr OGTT, ROB.   Roe Coombs, CNM

## 2017-01-04 NOTE — Progress Notes (Signed)
Patient reports good fetal movement, denies contractions but states that it is hard for her to walk up the steps because her chest hurts and it becomes hard for her to breathe. Patient states that she never activated babyrx and wants to be removed.

## 2017-01-15 ENCOUNTER — Encounter (HOSPITAL_COMMUNITY): Payer: Self-pay | Admitting: *Deleted

## 2017-01-15 ENCOUNTER — Inpatient Hospital Stay (HOSPITAL_COMMUNITY)
Admission: AD | Admit: 2017-01-15 | Discharge: 2017-01-15 | Disposition: A | Discharge status: Home / Self Care | Payer: Medicaid Other | Source: Ambulatory Visit | Attending: Family Medicine | Admitting: Family Medicine

## 2017-01-15 DIAGNOSIS — Z0371 Encounter for suspected problem with amniotic cavity and membrane ruled out: Secondary | ICD-10-CM

## 2017-01-15 DIAGNOSIS — O9989 Other specified diseases and conditions complicating pregnancy, childbirth and the puerperium: Secondary | ICD-10-CM

## 2017-01-15 DIAGNOSIS — O26899 Other specified pregnancy related conditions, unspecified trimester: Secondary | ICD-10-CM

## 2017-01-15 DIAGNOSIS — Z87891 Personal history of nicotine dependence: Secondary | ICD-10-CM | POA: Diagnosis not present

## 2017-01-15 DIAGNOSIS — M549 Dorsalgia, unspecified: Secondary | ICD-10-CM | POA: Diagnosis not present

## 2017-01-15 DIAGNOSIS — N760 Acute vaginitis: Secondary | ICD-10-CM

## 2017-01-15 DIAGNOSIS — R109 Unspecified abdominal pain: Secondary | ICD-10-CM | POA: Insufficient documentation

## 2017-01-15 DIAGNOSIS — Z3A25 25 weeks gestation of pregnancy: Secondary | ICD-10-CM | POA: Diagnosis not present

## 2017-01-15 DIAGNOSIS — B9689 Other specified bacterial agents as the cause of diseases classified elsewhere: Secondary | ICD-10-CM | POA: Insufficient documentation

## 2017-01-15 DIAGNOSIS — O23592 Infection of other part of genital tract in pregnancy, second trimester: Secondary | ICD-10-CM | POA: Insufficient documentation

## 2017-01-15 DIAGNOSIS — O99891 Other specified diseases and conditions complicating pregnancy: Secondary | ICD-10-CM

## 2017-01-15 DIAGNOSIS — R102 Pelvic and perineal pain: Secondary | ICD-10-CM

## 2017-01-15 DIAGNOSIS — O26892 Other specified pregnancy related conditions, second trimester: Secondary | ICD-10-CM

## 2017-01-15 HISTORY — DX: Gestational (pregnancy-induced) hypertension without significant proteinuria, unspecified trimester: O13.9

## 2017-01-15 LAB — URINALYSIS, ROUTINE W REFLEX MICROSCOPIC
BILIRUBIN URINE: NEGATIVE
Glucose, UA: NEGATIVE mg/dL
Hgb urine dipstick: NEGATIVE
KETONES UR: NEGATIVE mg/dL
Nitrite: NEGATIVE
Protein, ur: 30 mg/dL — AB
Specific Gravity, Urine: 1.017 (ref 1.005–1.030)
pH: 6 (ref 5.0–8.0)

## 2017-01-15 LAB — POCT FERN TEST: POCT Fern Test: NEGATIVE

## 2017-01-15 LAB — WET PREP, GENITAL
Sperm: NONE SEEN
TRICH WET PREP: NONE SEEN
YEAST WET PREP: NONE SEEN

## 2017-01-15 MED ORDER — METRONIDAZOLE 500 MG PO TABS: 500.0000 mg | ORAL_TABLET | Freq: Two times a day (BID) | ORAL | 0 refills | Status: DC

## 2017-01-15 MED ORDER — COMFORT FIT MATERNITY SUPP LG MISC: 1.0000 [IU] | Freq: Every day | 0 refills | Status: DC

## 2017-01-15 NOTE — Progress Notes (Signed)
When I entered the room to complete the triage and admission notes, the patient was on a video call with someone on her cell phone.  I told the patient that I would be asking her personal questions as part of her check in and I asked if she would like to end her call.  She said "No, your fine" and remained on the call.

## 2017-01-15 NOTE — Discharge Instructions (Signed)
Round Ligament Pain The round ligament is a cord of muscle and tissue that helps to support the uterus. It can become a source of pain during pregnancy if it becomes stretched or twisted as the baby grows. The pain usually begins in the second trimester of pregnancy, and it can come and go until the baby is delivered. It is not a serious problem, and it does not cause harm to the baby. Round ligament pain is usually a short, sharp, and pinching pain, but it can also be a dull, lingering, and aching pain. The pain is felt in the lower side of the abdomen or in the groin. It usually starts deep in the groin and moves up to the outside of the hip area. Pain can occur with:  A sudden change in position.  Rolling over in bed.  Coughing or sneezing.  Physical activity.  Follow these instructions at home: Watch your condition for any changes. Take these steps to help with your pain:  When the pain starts, relax. Then try: ? Sitting down. ? Flexing your knees up to your abdomen. ? Lying on your side with one pillow under your abdomen and another pillow between your legs. ? Sitting in a warm bath for 15-20 minutes or until the pain goes away.  Take over-the-counter and prescription medicines only as told by your health care provider.  Move slowly when you sit and stand.  Avoid long walks if they cause pain.  Stop or lessen your physical activities if they cause pain.  Contact a health care provider if:  Your pain does not go away with treatment.  You feel pain in your back that you did not have before.  Your medicine is not helping. Get help right away if:  You develop a fever or chills.  You develop uterine contractions.  You develop vaginal bleeding.  You develop nausea or vomiting.  You develop diarrhea.  You have pain when you urinate. This information is not intended to replace advice given to you by your health care provider. Make sure you discuss any questions you have  with your health care provider. Document Released: 01/03/2008 Document Revised: 09/01/2015 Document Reviewed: 06/02/2014 Elsevier Interactive Patient Education  2018 [employer]. Bacterial Vaginosis Bacterial vaginosis is a vaginal infection that occurs when the normal balance of bacteria in the vagina is disrupted. It results from an overgrowth of certain bacteria. This is the most common vaginal infection among women ages 80-44. Because bacterial vaginosis increases your risk for STIs (sexually transmitted infections), getting treated can help reduce your risk for chlamydia, gonorrhea, herpes, and HIV (human immunodeficiency virus). Treatment is also important for preventing complications in pregnant women, because this condition can cause an early (premature) delivery. What are the causes? This condition is caused by an increase in harmful bacteria that are normally present in small amounts in the vagina. However, the reason that the condition develops is not fully understood. What increases the risk? The following factors may make you more likely to develop this condition:  Having a new sexual partner or multiple sexual partners.  Having unprotected sex.  Douching.  Having an intrauterine device (IUD).  Smoking.  Drug and alcohol abuse.  Taking certain antibiotic medicines.  Being pregnant.  You cannot get bacterial vaginosis from toilet seats, bedding, swimming pools, or contact with objects around you. What are the signs or symptoms? Symptoms of this condition include:  Grey or white vaginal discharge. The discharge can also be watery or foamy.  A fish-like odor with discharge, especially after sexual intercourse or during menstruation.  Itching in and around the vagina.  Burning or pain with urination.  Some women with bacterial vaginosis have no signs or symptoms. How is this diagnosed? This condition is diagnosed based on:  Your medical history.  A physical  exam of the vagina.  Testing a sample of vaginal fluid under a microscope to look for a large amount of bad bacteria or abnormal cells. Your health care provider may use a cotton swab or a small wooden spatula to collect the sample.  How is this treated? This condition is treated with antibiotics. These may be given as a pill, a vaginal cream, or a medicine that is put into the vagina (suppository). If the condition comes back after treatment, a second round of antibiotics may be needed. Follow these instructions at home: Medicines  Take over-the-counter and prescription medicines only as told by your health care provider.  Take or use your antibiotic as told by your health care provider. Do not stop taking or using the antibiotic even if you start to feel better. General instructions  If you have a female sexual partner, tell her that you have a vaginal infection. She should see her health care provider and be treated if she has symptoms. If you have a female sexual partner, he does not need treatment.  During treatment: ? Avoid sexual activity until you finish treatment. ? Do not douche. ? Avoid alcohol as directed by your health care provider. ? Avoid breastfeeding as directed by your health care provider.  Drink enough water and fluids to keep your urine clear or pale yellow.  Keep the area around your vagina and rectum clean. ? Wash the area daily with warm water. ? Wipe yourself from front to back after using the toilet.  Keep all follow-up visits as told by your health care provider. This is important. How is this prevented?  Do not douche.  Wash the outside of your vagina with warm water only.  Use protection when having sex. This includes latex condoms and dental dams.  Limit how many sexual partners you have. To help prevent bacterial vaginosis, it is best to have sex with just one partner (monogamous).  Make sure you and your sexual partner are tested for STIs.  Wear  cotton or cotton-lined underwear.  Avoid wearing tight pants and pantyhose, especially during summer.  Limit the amount of alcohol that you drink.  Do not use any products that contain nicotine or tobacco, such as cigarettes and e-cigarettes. If you need help quitting, ask your health care provider.  Do not use illegal drugs. Where to find more information:  Centers for Disease Control and Prevention: SolutionApps.co.za  American Sexual Health Association (ASHA): www.ashastd.org  [place] Department of Health and Health and safety inspector, Office on Women's Health: ConventionalMedicines.si or http://www.anderson-williamson.info/ Contact a health care provider if:  Your symptoms do not improve, even after treatment.  You have more discharge or pain when urinating.  You have a fever.  You have pain in your abdomen.  You have pain during sex.  You have vaginal bleeding between periods. Summary  Bacterial vaginosis is a vaginal infection that occurs when the normal balance of bacteria in the vagina is disrupted.  Because bacterial vaginosis increases your risk for STIs (sexually transmitted infections), getting treated can help reduce your risk for chlamydia, gonorrhea, herpes, and HIV (human immunodeficiency virus). Treatment is also important for preventing complications in pregnant women, because the  condition can cause an early (premature) delivery.  This condition is treated with antibiotic medicines. These may be given as a pill, a vaginal cream, or a medicine that is put into the vagina (suppository). This information is not intended to replace advice given to you by your health care provider. Make sure you discuss any questions you have with your health care provider. Document Released: 03/26/2005 Document Revised: 12/10/2015 Document Reviewed: 12/10/2015 Elsevier Interactive Patient Education  2017 [employer].

## 2017-01-15 NOTE — MAU Note (Signed)
Pt ran out of Flexeril last month and is still having pain in abd and back.  Started having leaking 2 months ago "like I'm peeing on myself".  One month ago was treated for BV in [facility]U, took a course of antibiotics at that time, but leaking did not go away.

## 2017-01-15 NOTE — MAU Provider Note (Signed)
History     CSN: [id number]  Arrival date and time: 01/15/17 4098  First Provider Initiated Contact with Patient 01/15/17 [phone]      Chief Complaint  Patient presents with  . Abdominal Pain  . Back Pain   HPI [name] is a 26 y.o. [phone] at [redacted]w[redacted]d who presents with abdominal pain & vaginal discharge. Reports vaginal discharge that is watery & leaves her underwear wet. This has been going on since her last MAU visit at the end of September. Was diagnosed with BV & tx with flagyl; states she finished her medication but the discharge has continued.  Since yesterday reports intermittent abdominal pain & constant pelvic pressure. Abdominal pain occurs every 5-10 minutes but then stops. Denies n/v/d, fever/chills, dysuria, vaginal bleeding, or recent intercourse. Positive fetal movement.   OB History    Gravida Para Term Preterm AB Living   SAB TAB Ectopic Multiple Live Births   Obstetric Comments   D&C      Past Medical History:  Diagnosis Date  . Bacterial vaginosis 08/15/2016  . Headache(784.0)   . Hypertension   . Incompetent cervix   . Pregnancy induced hypertension   . Preterm labor   . Urinary tract infection     Past Surgical History:  Procedure Laterality Date  . BREAST FIBROADENOMA SURGERY  2006-7.   Removal;right  . CESAREAN SECTION    . DILATION AND CURETTAGE OF UTERUS    . THERAPEUTIC ABORTION      Family History  Problem Relation Age of Onset  . Diabetes Mother   . Diabetes Maternal Grandmother   . Hypertension Maternal Grandmother   . Cancer Maternal Grandmother        colon cancer, brain tumour  . Other Neg Hx     Social History  Substance Use Topics  . Smoking status: Former Smoker    Packs/day: 0.25    Types: Cigarettes    Quit date: 08/17/2016  . Smokeless tobacco: Former Neurosurgeon  . Alcohol use No    Allergies: No Known Allergies  Prescriptions Prior to Admission  Medication Sig Dispense Refill Last Dose   . Prenat-FeCbn-FeAspGl-FA-Omega (OB COMPLETE PETITE) 35-5-1-200 MG CAPS Take 1 tablet by mouth daily. 30 capsule 12     Review of Systems  Constitutional: Negative.   Gastrointestinal: Positive for abdominal pain.  Genitourinary: Positive for vaginal discharge. Negative for dysuria and vaginal bleeding.   Physical Exam   Blood pressure 118/60, pulse 95, temperature 98.5 F (36.9 C), temperature source Oral, resp. rate 18, last menstrual period 07/11/2016, SpO2 100 %, unknown if currently breastfeeding.  Physical Exam  Nursing note and vitals reviewed. Constitutional: She is oriented to person, place, and time. She appears well-developed and well-nourished. No distress.  HENT:  Head: Normocephalic and atraumatic.  Eyes: Conjunctivae are normal. Right eye exhibits no discharge. Left eye exhibits no discharge. No scleral icterus.  Neck: Normal range of motion.  Respiratory: Effort normal. No respiratory distress.  GI: Soft. There is no tenderness.  Genitourinary: Cervix exhibits no discharge and no friability. No bleeding in the vagina. Vaginal discharge (moderate amount of creamy white discharge; no pooling of fluid. ) found.  Genitourinary Comments: Dilation: Closed Effacement (%): Thick Cervical Position: Posterior Station: -3 Exam by:: Judeth Horn NP   Neurological: She is alert and oriented to person, place, and time.  Skin: Skin is warm  and dry. She is not diaphoretic.  Psychiatric: She has a normal mood and affect. Her behavior is normal. Judgment and thought content normal.   Fetal Tracing:  Baseline: 150 Variability: moderate Accelerations: none Decelerations: none  Toco: x1 MAU Course  Procedures Results for orders placed or performed during the hospital encounter of 01/15/17 (from the past 24 hour(s))  Urinalysis, Routine w reflex microscopic     Status: Abnormal   Collection Time: 01/15/17  8:10 AM  Result Value Ref Range   Color, Urine YELLOW YELLOW    APPearance CLOUDY (A) CLEAR   Specific Gravity, Urine 1.017 1.005 - 1.030   pH 6.0 5.0 - 8.0   Glucose, UA NEGATIVE NEGATIVE mg/dL   Hgb urine dipstick NEGATIVE NEGATIVE   Bilirubin Urine NEGATIVE NEGATIVE   Ketones, ur NEGATIVE NEGATIVE mg/dL   Protein, ur 30 (A) NEGATIVE mg/dL   Nitrite NEGATIVE NEGATIVE   Leukocytes, UA MODERATE (A) NEGATIVE   RBC / HPF 0-5 0 - 5 RBC/hpf   WBC, UA 6-30 0 - 5 WBC/hpf   Bacteria, UA FEW (A) NONE SEEN   Squamous Epithelial / LPF TOO NUMEROUS TO COUNT (A) NONE SEEN   Mucus PRESENT   Wet prep, genital     Status: Abnormal   Collection Time: 01/15/17  9:13 AM  Result Value Ref Range   Yeast Wet Prep HPF POC NONE SEEN NONE SEEN   Trich, Wet Prep NONE SEEN NONE SEEN   Clue Cells Wet Prep HPF POC PRESENT (A) NONE SEEN   WBC, Wet Prep HPF POC MODERATE (A) NONE SEEN   Sperm NONE SEEN   POCT fern test     Status: None   Collection Time: 01/15/17  9:13 AM  Result Value Ref Range   POCT Fern Test Negative = intact amniotic membranes     MDM Fetal tracing appropriate for gestation Ctx x 1 on toco; none palpated Cervix closed/thick No pooling of fluid on exam & fern slide viewed by myself is negative  Assessment and Plan  A: 1. BV (bacterial vaginosis)   2. Encounter for suspected PROM, with rupture of membranes not found   3. Back pain affecting pregnancy in second trimester   4. Pain of round ligament affecting pregnancy, antepartum   5. [redacted] weeks gestation of pregnancy    P: Discharge home Rx flagyl Rx & recommendation for maternity support belt Discussed reasons to return to MAU including s/s PTL Keep f/u with OB  Judeth Horn 01/15/2017, 8:36 AM

## 2017-02-01 ENCOUNTER — Ambulatory Visit (INDEPENDENT_AMBULATORY_CARE_PROVIDER_SITE_OTHER): Payer: Medicaid Other | Admitting: Certified Nurse Midwife

## 2017-02-01 ENCOUNTER — Other Ambulatory Visit: Payer: Medicaid Other

## 2017-02-01 VITALS — BP 130/78 | HR 90 | Wt 246.3 lb

## 2017-02-01 DIAGNOSIS — Z3483 Encounter for supervision of other normal pregnancy, third trimester: Secondary | ICD-10-CM

## 2017-02-01 DIAGNOSIS — Z3480 Encounter for supervision of other normal pregnancy, unspecified trimester: Secondary | ICD-10-CM

## 2017-02-01 DIAGNOSIS — Z8759 Personal history of other complications of pregnancy, childbirth and the puerperium: Secondary | ICD-10-CM

## 2017-02-01 DIAGNOSIS — Z98891 History of uterine scar from previous surgery: Secondary | ICD-10-CM

## 2017-02-01 MED ORDER — VITAFOL-NANO 18-0.6-0.4 MG PO TABS: 1.0000 | ORAL_TABLET | Freq: Every day | ORAL | 12 refills | Status: DC

## 2017-02-01 MED ORDER — ASPIRIN 81 MG PO CHEW: 81.0000 mg | CHEWABLE_TABLET | Freq: Every day | ORAL | 12 refills | Status: DC

## 2017-02-01 NOTE — Progress Notes (Signed)
   PRENATAL VISIT NOTE  Subjective:  [name] is a [age] y.o. [phone] [id number] at 6478w0d being seen today for ongoing prenatal care.  She is currently monitored for the following issues for this low-risk pregnancy and has OBESITY; TOBACCO USE, QUIT; Supervision of other normal pregnancy, antepartum; and History of C-section on her problem list.  Patient reports no complaints.  Contractions: Not present. Vag. Bleeding: None.  Movement: Present. Denies leaking of fluid.   The following portions of the patient's history were reviewed and updated as appropriate: allergies, current medications, past family history, past medical history, past social history, past surgical history and problem list. Problem list updated.  Objective:   Vitals:   02/01/17 0923  BP: 130/78  Pulse: 90  Weight: 246 lb 4.8 oz (111.7 kg)    Fetal Status: Fetal Heart Rate (bpm): 145; doppler Fundal Height: 32 cm Movement: Present     General:  Alert, oriented and cooperative. Patient is in no acute distress.  Skin: Skin is warm and dry. No rash noted.   Cardiovascular: Normal heart rate noted  Respiratory: Normal respiratory effort, no problems with respiration noted  Abdomen: Soft, gravid, appropriate for gestational age.  Pain/Pressure: Present     Pelvic: Cervical exam deferred        Extremities: Normal range of motion.  Edema: Trace  Mental Status:  Normal mood and affect. Normal behavior. Normal judgment and thought content.   Assessment and Plan:  Pregnancy: [id number] at 3578w0d  1. Supervision of other normal pregnancy, antepartum      Doing well.  30 day BTL papers completed. S>D: f/u scheduled for 02/07/17.   - Glucose Tolerance, 2 Hours w/1 Hour - CBC - HIV antibody (with reflex) - RPR - Prenatal-Fe Fum-Methf-FA w/o A (VITAFOL-NANO) 18-0.6-0.4 MG TABS; Take 1 tablet by mouth daily.  Dispense: 30 tablet; Refill: 12  2. History of C-section      Repeat C-section with BTL scheduled for 04/19/17  Preterm labor  symptoms and general obstetric precautions including but not limited to vaginal bleeding, contractions, leaking of fluid and fetal movement were reviewed in detail with the patient. Please refer to After Visit Summary for other counseling recommendations.  Return in about 2 weeks (around 02/15/2017) for ROB.   Roe Coombsachelle A Delfino Friesen, CNM

## 2017-02-01 NOTE — Progress Notes (Signed)
Declined the TDAP.  Complains that the PNV makes her sick (NV) so she stopped taking them.

## 2017-02-02 LAB — RPR: RPR Ser Ql: NONREACTIVE

## 2017-02-02 LAB — CBC
Hematocrit: 33 % — ABNORMAL LOW (ref 34.0–46.6)
Hemoglobin: 11.1 g/dL (ref 11.1–15.9)
MCH: 29.5 pg (ref 26.6–33.0)
MCHC: 33.6 g/dL (ref 31.5–35.7)
MCV: 88 fL (ref 79–97)
PLATELETS: 269 10*3/uL (ref 150–379)
RBC: 3.76 x10E6/uL — AB (ref 3.77–5.28)
RDW: 13.9 % (ref 12.3–15.4)
WBC: 5.9 10*3/uL (ref 3.4–10.8)

## 2017-02-02 LAB — GLUCOSE TOLERANCE, 2 HOURS W/ 1HR
GLUCOSE, FASTING: 86 mg/dL (ref 65–91)
Glucose, 1 hour: 113 mg/dL (ref 65–179)
Glucose, 2 hour: 102 mg/dL (ref 65–152)

## 2017-02-02 LAB — HIV ANTIBODY (ROUTINE TESTING W REFLEX): HIV Screen 4th Generation wRfx: NONREACTIVE

## 2017-02-05 ENCOUNTER — Other Ambulatory Visit: Payer: Self-pay | Admitting: Certified Nurse Midwife

## 2017-02-05 DIAGNOSIS — Z3480 Encounter for supervision of other normal pregnancy, unspecified trimester: Secondary | ICD-10-CM

## 2017-02-07 ENCOUNTER — Ambulatory Visit (HOSPITAL_COMMUNITY)
Admission: RE | Admit: 2017-02-07 | Discharge: 2017-02-07 | Disposition: A | Discharge status: Home / Self Care | Payer: Medicaid Other | Source: Ambulatory Visit | Attending: Certified Nurse Midwife | Admitting: Certified Nurse Midwife

## 2017-02-07 ENCOUNTER — Other Ambulatory Visit: Payer: Self-pay | Admitting: Certified Nurse Midwife

## 2017-02-07 DIAGNOSIS — O09299 Supervision of pregnancy with other poor reproductive or obstetric history, unspecified trimester: Secondary | ICD-10-CM | POA: Diagnosis not present

## 2017-02-07 DIAGNOSIS — O09893 Supervision of other high risk pregnancies, third trimester: Secondary | ICD-10-CM

## 2017-02-07 DIAGNOSIS — O99213 Obesity complicating pregnancy, third trimester: Secondary | ICD-10-CM

## 2017-02-07 DIAGNOSIS — Z3A30 30 weeks gestation of pregnancy: Secondary | ICD-10-CM | POA: Diagnosis not present

## 2017-02-07 DIAGNOSIS — O34219 Maternal care for unspecified type scar from previous cesarean delivery: Secondary | ICD-10-CM

## 2017-02-07 DIAGNOSIS — Z3480 Encounter for supervision of other normal pregnancy, unspecified trimester: Secondary | ICD-10-CM

## 2017-02-07 DIAGNOSIS — O09213 Supervision of pregnancy with history of pre-term labor, third trimester: Secondary | ICD-10-CM

## 2017-02-11 ENCOUNTER — Other Ambulatory Visit: Payer: Self-pay | Admitting: Certified Nurse Midwife

## 2017-02-11 DIAGNOSIS — Z3480 Encounter for supervision of other normal pregnancy, unspecified trimester: Secondary | ICD-10-CM

## 2017-02-14 ENCOUNTER — Inpatient Hospital Stay (HOSPITAL_COMMUNITY)
Admission: AD | Admit: 2017-02-14 | Discharge: 2017-02-14 | Disposition: A | Discharge status: Home / Self Care | Payer: Medicaid Other | Source: Ambulatory Visit | Attending: Obstetrics & Gynecology | Admitting: Obstetrics & Gynecology

## 2017-02-14 ENCOUNTER — Encounter (HOSPITAL_COMMUNITY): Payer: Self-pay

## 2017-02-14 DIAGNOSIS — O219 Vomiting of pregnancy, unspecified: Secondary | ICD-10-CM | POA: Diagnosis not present

## 2017-02-14 DIAGNOSIS — Z87891 Personal history of nicotine dependence: Secondary | ICD-10-CM | POA: Insufficient documentation

## 2017-02-14 DIAGNOSIS — R109 Unspecified abdominal pain: Secondary | ICD-10-CM | POA: Diagnosis not present

## 2017-02-14 DIAGNOSIS — R112 Nausea with vomiting, unspecified: Secondary | ICD-10-CM | POA: Diagnosis present

## 2017-02-14 DIAGNOSIS — R197 Diarrhea, unspecified: Secondary | ICD-10-CM | POA: Diagnosis not present

## 2017-02-14 DIAGNOSIS — O26893 Other specified pregnancy related conditions, third trimester: Secondary | ICD-10-CM | POA: Insufficient documentation

## 2017-02-14 DIAGNOSIS — Z3A29 29 weeks gestation of pregnancy: Secondary | ICD-10-CM | POA: Diagnosis not present

## 2017-02-14 DIAGNOSIS — Z3480 Encounter for supervision of other normal pregnancy, unspecified trimester: Secondary | ICD-10-CM

## 2017-02-14 DIAGNOSIS — N898 Other specified noninflammatory disorders of vagina: Secondary | ICD-10-CM | POA: Insufficient documentation

## 2017-02-14 DIAGNOSIS — O212 Late vomiting of pregnancy: Secondary | ICD-10-CM | POA: Diagnosis not present

## 2017-02-14 LAB — URINALYSIS, ROUTINE W REFLEX MICROSCOPIC
BILIRUBIN URINE: NEGATIVE
Glucose, UA: NEGATIVE mg/dL
Ketones, ur: NEGATIVE mg/dL
Nitrite: POSITIVE — AB
PROTEIN: 30 mg/dL — AB
SPECIFIC GRAVITY, URINE: 1.025 (ref 1.005–1.030)
pH: 6 (ref 5.0–8.0)

## 2017-02-14 LAB — URINALYSIS, MICROSCOPIC (REFLEX)

## 2017-02-14 MED ORDER — METOCLOPRAMIDE HCL 10 MG PO TABS: 10.0000 mg | ORAL_TABLET | Freq: Four times a day (QID) | ORAL | 0 refills | Status: DC

## 2017-02-14 MED ORDER — ONDANSETRON 4 MG PO TBDP: 4.0000 mg | ORAL_TABLET | Freq: Three times a day (TID) | ORAL | 0 refills | Status: DC | PRN

## 2017-02-14 MED ORDER — ONDANSETRON 8 MG PO TBDP
8.0000 mg | ORAL_TABLET | Freq: Once | ORAL | Status: AC
  Administered 2017-02-14: 8 mg via ORAL
  Filled 2017-02-14: qty 1

## 2017-02-14 NOTE — MAU Provider Note (Signed)
History     CSN: [id number]  Arrival date and time: 02/14/17 1131   First Provider Initiated Contact with Patient 02/14/17 1229      Chief Complaint  Patient presents with  . Abdominal Pain  . Emesis   HPI  [name] is a 26 y.o. [phone] at 5630w6d gestation presenting to MAU with complaints of N/V/D since last night. She reports eating sausage dip; which she eats several times a week. She denies eating any foods out of the ordinary. She also has complaints of increased vaginal d/c, but refuses to have pelvic exam. Patient is requesting to d/c'd home before 2 pm, because she has to pick up her twins from school. So she is asking to have Rx for N/V and not have to receive IVFs.  Past Medical History:  Diagnosis Date  . Bacterial vaginosis 08/15/2016  . Headache(784.0)   . Hypertension   . Incompetent cervix   . Pregnancy induced hypertension   . Preterm labor   . Urinary tract infection     Past Surgical History:  Procedure Laterality Date  . BREAST FIBROADENOMA SURGERY  2006-7.   Removal;right  . CESAREAN SECTION    . DILATION AND CURETTAGE OF UTERUS    . THERAPEUTIC ABORTION      Family History  Problem Relation Age of Onset  . Diabetes Mother   . Diabetes Maternal Grandmother   . Hypertension Maternal Grandmother   . Cancer Maternal Grandmother        colon cancer, brain tumour  . Other Neg Hx     Social History   Tobacco Use  . Smoking status: Former Smoker    Packs/day: 0.25    Types: Cigarettes    Last attempt to quit: 08/17/2016    Years since quitting: 0.4  . Smokeless tobacco: Former Engineer, waterUser  Substance Use Topics  . Alcohol use: No    Alcohol/week: 0.0 oz  . Drug use: No    Comment: daily , recently started 2 months prior    Allergies: No Known Allergies  Medications Prior to Admission  Medication Sig Dispense Refill Last Dose  . acetaminophen (TYLENOL) 500 MG tablet Take 500 mg by mouth every 6 (six) hours as needed for mild pain or  headache.   Past Month at Unknown time  . aspirin 81 MG chewable tablet Chew 1 tablet (81 mg total) by mouth daily. 30 tablet 12 02/14/2017 at Unknown time  . Prenatal-Fe Fum-Methf-FA w/o A (VITAFOL-NANO) 18-0.6-0.4 MG TABS Take 1 tablet by mouth daily. 30 tablet 12 02/14/2017 at Unknown time  . Elastic Bandages & Supports (COMFORT FIT MATERNITY SUPP LG) MISC 1 Units by Does not apply route daily. (Patient not taking: Reported on 02/01/2017) 1 each 0 Not Taking    Review of Systems  Constitutional: Negative.   HENT: Negative.   Eyes: Negative.   Respiratory: Negative.   Cardiovascular: Negative.   Gastrointestinal: Positive for diarrhea, nausea and vomiting.  Endocrine: Negative.   Genitourinary: Positive for vaginal discharge.  Allergic/Immunologic: Negative.   Neurological: Negative.   Hematological: Negative.   Psychiatric/Behavioral: Negative.    Physical Exam   Blood pressure (!) 123/57, pulse 95, temperature 98.5 F (36.9 C), resp. rate 18, height 5\' 4"  (1.626 m), weight 246 lb (111.6 kg), last menstrual period 07/11/2016, unknown if currently breastfeeding.  Physical Exam  Nursing note and vitals reviewed. Constitutional: She is oriented to person, place, and time. She appears well-developed and well-nourished.  HENT:  Head: Normocephalic.  Eyes: Pupils are equal, round, and reactive to light.  Neck: Normal range of motion.  Cardiovascular: Normal rate, regular rhythm and normal heart sounds.  Respiratory: Effort normal and breath sounds normal.  GI: Soft. Bowel sounds are normal.  Musculoskeletal: Normal range of motion.  Neurological: She is alert and oriented to person, place, and time.  Skin: Skin is warm and dry.  Psychiatric: She has a normal mood and affect. Her behavior is normal. Judgment and thought content normal.    MAU Course  Procedures  MDM CCUA Zofran 8mg  ODT  Results for orders placed or performed during the hospital encounter of 02/14/17 (from  the past 24 hour(s))  Urinalysis, Routine w reflex microscopic     Status: Abnormal   Collection Time: 02/14/17 11:50 AM  Result Value Ref Range   Color, Urine YELLOW YELLOW   APPearance CLOUDY (A) CLEAR   Specific Gravity, Urine 1.025 1.005 - 1.030   pH 6.0 5.0 - 8.0   Glucose, UA NEGATIVE NEGATIVE mg/dL   Hgb urine dipstick TRACE (A) NEGATIVE   Bilirubin Urine NEGATIVE NEGATIVE   Ketones, ur NEGATIVE NEGATIVE mg/dL   Protein, ur 30 (A) NEGATIVE mg/dL   Nitrite POSITIVE (A) NEGATIVE   Leukocytes, UA TRACE (A) NEGATIVE  Urinalysis, Microscopic (reflex)     Status: Abnormal   Collection Time: 02/14/17 11:50 AM  Result Value Ref Range   RBC / HPF 0-5 0 - 5 RBC/hpf   WBC, UA 0-5 0 - 5 WBC/hpf   Bacteria, UA MANY (A) NONE SEEN   Squamous Epithelial / LPF 6-30 (A) NONE SEEN     Assessment and Plan  Nausea vomiting and diarrhea - Rx for Zofran 4 mg ODT every 8 hrs prn N/V - Rx for Reglan 10 mg every 6 hrs - Information provided on diarrhea, food choices to relieve diarrhea, N/V, Zofran, & Reglan  Discharge home Patient verbalized an understanding of the plan of care and agrees.   Breanna Moraolitta Monet North, MSN, CNM 02/14/2017, 12:34 PM

## 2017-02-14 NOTE — MAU Provider Note (Signed)
History     CSN: [id number]  Arrival date & time 02/14/17  1131   First Provider Initiated Contact with Patient 02/14/17 1229      Chief Complaint  Patient presents with  . Abdominal Pain  . Emesis    Pt is a 26 yo G5P2 at 3147w6d who presents with complaints of N/V/D. Pt states her symptoms started last night around 10 pm. She has since had 4 episodes of emesis and diarrhea, but she has not noticed any blood in either one. Pt also has some associated abd cramps, SOB, some dizziness and chills but denies any fever, headaches, chest pain, vaginal bleeding. She reports vaginal discharge that is more than her usual. Of note, pt does not want an IV so she can go pick up her twins from school.     Past Medical History:  Diagnosis Date  . Bacterial vaginosis 08/15/2016  . Headache(784.0)   . Hypertension   . Incompetent cervix   . Pregnancy induced hypertension   . Preterm labor   . Urinary tract infection     Past Surgical History:  Procedure Laterality Date  . BREAST FIBROADENOMA SURGERY  2006-7.   Removal;right  . CESAREAN SECTION    . DILATION AND CURETTAGE OF UTERUS    . THERAPEUTIC ABORTION      Family History  Problem Relation Age of Onset  . Diabetes Mother   . Diabetes Maternal Grandmother   . Hypertension Maternal Grandmother   . Cancer Maternal Grandmother        colon cancer, brain tumour  . Other Neg Hx     Social History   Tobacco Use  . Smoking status: Former Smoker    Packs/day: 0.25    Types: Cigarettes    Last attempt to quit: 08/17/2016    Years since quitting: 0.4  . Smokeless tobacco: Former Engineer, waterUser  Substance Use Topics  . Alcohol use: No    Alcohol/week: 0.0 oz  . Drug use: No    Comment: daily , recently started 2 months prior    OB History    Gravida Para Term Preterm AB Living   5 1   1 3 2    SAB TAB Ectopic Multiple Live Births   2 1   1 2       Obstetric Comments   D&C      Review of Systems  Constitutional: Positive for  appetite change and chills. Negative for activity change and fever.  HENT: Negative for congestion.   Eyes: Negative for discharge, itching and visual disturbance.  Respiratory: Negative for cough, chest tightness and wheezing.   Cardiovascular: Negative for chest pain, palpitations and leg swelling.  Gastrointestinal: Positive for abdominal pain, diarrhea, nausea and vomiting. Negative for abdominal distention and constipation.  Genitourinary: Positive for vaginal discharge. Negative for difficulty urinating, dysuria, hematuria and vaginal bleeding.  Musculoskeletal: Negative for back pain and myalgias.  Skin: Negative for color change and rash.  Neurological: Positive for dizziness. Negative for headaches.  Psychiatric/Behavioral: Negative for agitation and confusion.    Allergies  Patient has no known allergies.  Home Medications    BP (!) 123/57   Pulse 95   Temp 98.5 F (36.9 C)   Resp 18   Ht 5\' 4"  (1.626 m)   Wt 111.6 kg (246 lb)   LMP 07/11/2016 (Exact Date)   BMI 42.23 kg/m   Physical Exam  Constitutional: She is oriented to person, place, and time. She appears well-developed and well-nourished. No  distress.  HENT:  Head: Normocephalic and atraumatic.  Eyes: Right eye exhibits no discharge. Left eye exhibits no discharge. No scleral icterus.  Neck: Normal range of motion. Neck supple.  Cardiovascular: Normal rate, regular rhythm and intact distal pulses. Exam reveals no gallop and no friction rub.  No murmur heard. Pulmonary/Chest: Effort normal and breath sounds normal. No respiratory distress. She has no wheezes.  Abdominal: Soft. Bowel sounds are normal. She exhibits no distension and no mass. There is tenderness. There is no rebound and no guarding.  Musculoskeletal: Normal range of motion.  Neurological: She is alert and oriented to person, place, and time.  Skin: Skin is warm and dry. No rash noted.  Psychiatric: She has a normal mood and affect.    MAU  Course  Procedures (including critical care time)  Labs Reviewed  URINALYSIS, ROUTINE W REFLEX MICROSCOPIC - Abnormal; Notable for the following components:      Result Value   APPearance CLOUDY (*)    Hgb urine dipstick TRACE (*)    Protein, ur 30 (*)    Nitrite POSITIVE (*)    Leukocytes, UA TRACE (*)    All other components within normal limits  URINALYSIS, MICROSCOPIC (REFLEX) - Abnormal; Notable for the following components:   Bacteria, UA MANY (*)    Squamous Epithelial / LPF 6-30 (*)    All other components within normal limits  WET PREP, GENITAL  GC/CHLAMYDIA PROBE AMP ([place]) NOT AT [facility]   No results found. Results for orders placed or performed during the hospital encounter of 02/14/17 (from the past 24 hour(s))  Urinalysis, Routine w reflex microscopic     Status: Abnormal   Collection Time: 02/14/17 11:50 AM  Result Value Ref Range   Color, Urine YELLOW YELLOW   APPearance CLOUDY (A) CLEAR   Specific Gravity, Urine 1.025 1.005 - 1.030   pH 6.0 5.0 - 8.0   Glucose, UA NEGATIVE NEGATIVE mg/dL   Hgb urine dipstick TRACE (A) NEGATIVE   Bilirubin Urine NEGATIVE NEGATIVE   Ketones, ur NEGATIVE NEGATIVE mg/dL   Protein, ur 30 (A) NEGATIVE mg/dL   Nitrite POSITIVE (A) NEGATIVE   Leukocytes, UA TRACE (A) NEGATIVE  Urinalysis, Microscopic (reflex)     Status: Abnormal   Collection Time: 02/14/17 11:50 AM  Result Value Ref Range   RBC / HPF 0-5 0 - 5 RBC/hpf   WBC, UA 0-5 0 - 5 WBC/hpf   Bacteria, UA MANY (A) NONE SEEN   Squamous Epithelial / LPF 6-30 (A) NONE SEEN     1. Supervision of other normal pregnancy, antepartum       MDM  Pt with complaint of N/V/D with abd cramps and vaginal discharge UA negative for UTI. Lack of fever rule out infectious agents. Possible hyperemesis gravidarum -  Will give zofran-ODT, reassess after 20 min and discharge with prescription for Zofran and Reglan.  Pt has refused cervical swap for wet prep and GC/Chlamydia  cultures

## 2017-02-14 NOTE — MAU Note (Signed)
Pt reports having abd cramping and N/V/D on and off all night. Staes she can't keep anything down.

## 2017-02-14 NOTE — MAU Note (Signed)
Pt refused swabs

## 2017-02-15 ENCOUNTER — Encounter (HOSPITAL_COMMUNITY): Payer: Self-pay

## 2017-02-18 ENCOUNTER — Other Ambulatory Visit: Payer: Self-pay

## 2017-02-18 ENCOUNTER — Ambulatory Visit (INDEPENDENT_AMBULATORY_CARE_PROVIDER_SITE_OTHER): Payer: Medicaid Other | Admitting: Certified Nurse Midwife

## 2017-02-18 VITALS — BP 113/73 | HR 108 | Wt 250.0 lb

## 2017-02-18 DIAGNOSIS — Z3483 Encounter for supervision of other normal pregnancy, third trimester: Secondary | ICD-10-CM

## 2017-02-18 DIAGNOSIS — Z3480 Encounter for supervision of other normal pregnancy, unspecified trimester: Secondary | ICD-10-CM

## 2017-02-18 DIAGNOSIS — O34219 Maternal care for unspecified type scar from previous cesarean delivery: Secondary | ICD-10-CM

## 2017-02-18 DIAGNOSIS — Z98891 History of uterine scar from previous surgery: Secondary | ICD-10-CM

## 2017-02-18 DIAGNOSIS — Z8759 Personal history of other complications of pregnancy, childbirth and the puerperium: Secondary | ICD-10-CM

## 2017-02-18 NOTE — Patient Instructions (Signed)
[facility] [place] [address], [address] [place], [place]  [address] Phone - [phone]   Fax - [phone]  [facility] [place] [address] [place], [place] [address] Phone - [phone]   Fax - [place] [address] [place], [place]  [address] Phone - [phone]   Fax - [phone]  [place]. [address] [place] [address] [place], [place]  [address] Phone - [phone]   Fax - [phone]  [place] [address] [place], [place]  [address] Phone - [phone]   Fax - [phone]  [facility] [address] [place], [place]  [address] Phone - [phone]   Fax - [place] [address] [place], [place]  [address] Phone - [phone]   Fax - [phone]  [place] [address] [place], [place] [address] [place], [place]  [address] Phone - [phone]   Fax - [place] [address] [place], [place]  [address] Phone - [phone]   Fax - [phone] [facility] [place] [place]. [address] [place], [place]  [address] Phone - [phone]   Fax - [phone]  [place] [address] [place], [place]  [address] Phone - [phone]   Fax - [phone]  [facility] [place], [place], [place]  [address] Phone - [phone]   Fax - [place] [address] [place], [place]  [address] Phone - [phone]   Fax - [phone]  [facility] [address] [place], [place]  [address] Phone - [place], [place]  [address] Phone - [phone]   Fax - [place] [address] [place], [place]  [address] Phone - [phone]   Fax - [phone]  [place] [address] [place], [place]  [address] Phone - [phone]   Fax - [place]. [place], [place]  [address] Phone - [phone]   Fax - [place], [place], [place]  [address] Phone - [phone]   Fax - [place] [address], [place], [place]  [address] Phone - [phone]   Fax - [phone]  [place] [address] [place], [place]  [address] Phone - [phone]   Fax - [place] [address] [place], [place]  [address] Phone - [phone]   Fax - [phone]  [place] [address] [place], [place]  [address] Phone - [phone]   Fax - [phone] [place] [address] [place], [place]  [address] Phone - [phone]   Fax - [place] [address] [place], [place]  [address] Phone - [phone]   Fax - [place] [address] [place], [place]  [address] Phone - [phone]   Fax - [phone]  [place] [address] [place] [address] Phone [phone]  Fax [phone]  Places to have your son circumcised:    [facility] [phone] while you  are in hospital  [facility] [phone] $244 by 4 wks  [facility] [phone] $185? by 2 wks  [place] [phone] $269 by 14 days [facility] [phone] $150 by 4 wks  These prices sometimes change but are roughly what you can expect to pay.  Please call and confirm pricing.   Circumcision is considered an elective/non-medically necessary procedure. There are many reasons parents decide to have their sons circumsized. During the first year of life circumcised males have a reduced risk of urinary tract infections but after this year the rates between circumcised males and uncircumcised males are the same.  It is safe to have your son circumcised outside of the hospital and the places above perform them regularly.

## 2017-02-18 NOTE — Progress Notes (Signed)
   PRENATAL VISIT NOTE  Subjective:  [name] is a 26 y.o. [phone] at 2451w3d being seen today for ongoing prenatal care.  She is currently monitored for the following issues for this low-risk pregnancy and has OBESITY; TOBACCO USE, QUIT; Supervision of other normal pregnancy, antepartum; History of C-section; History of pregnancy induced hypertension; and Nausea vomiting and diarrhea on their problem list.  Patient reports backache, headache, no bleeding, no contractions, no cramping, no leaking and round ligament pain: has not picked up RX for maternity support belt.  States that tylenol helps with HAs. .  Contractions: Irregular. Vag. Bleeding: None.  Movement: Present. Denies leaking of fluid.   The following portions of the patient's history were reviewed and updated as appropriate: allergies, current medications, past family history, past medical history, past social history, past surgical history and problem list. Problem list updated.  Objective:   Vitals:   02/18/17 0953  BP: 113/73  Pulse: (!) 108  Weight: 250 lb (113.4 kg)    Fetal Status: Fetal Heart Rate (bpm): 155; doppler Fundal Height: 32 cm Movement: Present     General:  Alert, oriented and cooperative. Patient is in no acute distress.  Skin: Skin is warm and dry. No rash noted.   Cardiovascular: Normal heart rate noted  Respiratory: Normal respiratory effort, no problems with respiration noted  Abdomen: Soft, gravid, appropriate for gestational age.  Pain/Pressure: Present     Pelvic: Cervical exam deferred        Extremities: Normal range of motion.  Edema: Trace  Mental Status:  Normal mood and affect. Normal behavior. Normal judgment and thought content.   Assessment and Plan:  Pregnancy: [id number] at 5251w3d  1. Supervision of other normal pregnancy, antepartum     Doing well overall.   2. History of C-section     Repeat C-section with BTL discussed.   3. History of pregnancy induced hypertension  Normotensive today, taking baby asa tx.   Preterm labor symptoms and general obstetric precautions including but not limited to vaginal bleeding, contractions, leaking of fluid and fetal movement were reviewed in detail with the patient. Please refer to After Visit Summary for other counseling recommendations.  Return in about 2 weeks (around 03/04/2017) for ROB.   Roe Coombsachelle A Braxtin Bamba, CNM

## 2017-02-18 NOTE — Progress Notes (Signed)
Having contractions 6 minutes apart x 3 weeks. Headaches 8/10

## 2017-03-05 ENCOUNTER — Ambulatory Visit (INDEPENDENT_AMBULATORY_CARE_PROVIDER_SITE_OTHER): Payer: Medicaid Other | Admitting: Certified Nurse Midwife

## 2017-03-05 ENCOUNTER — Encounter: Payer: Self-pay | Admitting: Certified Nurse Midwife

## 2017-03-05 VITALS — BP 118/74 | HR 105 | Wt 249.1 lb

## 2017-03-05 DIAGNOSIS — M545 Low back pain, unspecified: Secondary | ICD-10-CM

## 2017-03-05 DIAGNOSIS — Z3483 Encounter for supervision of other normal pregnancy, third trimester: Secondary | ICD-10-CM

## 2017-03-05 DIAGNOSIS — Z3480 Encounter for supervision of other normal pregnancy, unspecified trimester: Secondary | ICD-10-CM

## 2017-03-05 DIAGNOSIS — Z98891 History of uterine scar from previous surgery: Secondary | ICD-10-CM

## 2017-03-05 LAB — POCT URINALYSIS DIPSTICK
BILIRUBIN UA: NEGATIVE
GLUCOSE UA: NEGATIVE
KETONES UA: NEGATIVE
NITRITE UA: NEGATIVE
PH UA: 6 (ref 5.0–8.0)
Protein, UA: 2
Spec Grav, UA: 1.015 (ref 1.010–1.025)
Urobilinogen, UA: 0.2 E.U./dL

## 2017-03-05 NOTE — Progress Notes (Signed)
   PRENATAL VISIT NOTE  Subjective:  [name] is a 26 y.o. [phone] at 81w4dbeing seen today for ongoing prenatal care.  She is currently monitored for the following issues for this low-risk pregnancy and has OBESITY; TOBACCO USE, QUIT; Supervision of other normal pregnancy, antepartum; History of C-section; History of pregnancy induced hypertension; and Nausea vomiting and diarrhea on their problem list.  Patient reports no bleeding, no contractions, no cramping, no leaking and UTI symptoms, urine culture sent.  Contractions: Irregular. Vag. Bleeding: None.  Movement: Present. Denies leaking of fluid.   The following portions of the patient's history were reviewed and updated as appropriate: allergies, current medications, past family history, past medical history, past social history, past surgical history and problem list. Problem list updated.  Objective:   Vitals:   03/05/17 0947  BP: 118/74  Pulse: (!) 105  Weight: 249 lb 1.6 oz (113 kg)    Fetal Status: Fetal Heart Rate (bpm): 146; doppler Fundal Height: 35 cm Movement: Present     General:  Alert, oriented and cooperative. Patient is in no acute distress.  Skin: Skin is warm and dry. No rash noted.   Cardiovascular: Normal heart rate noted  Respiratory: Normal respiratory effort, no problems with respiration noted  Abdomen: Soft, gravid, appropriate for gestational age.  Pain/Pressure: Present     Pelvic: Cervical exam deferred        Extremities: Normal range of motion.  Edema: Trace  Mental Status:  Normal mood and affect. Normal behavior. Normal judgment and thought content.   Assessment and Plan:  Pregnancy: [id number] 358w4d1. Supervision of other normal pregnancy, antepartum      Possible UTI.  SW met with patient in office.  - Culture, OB Urine - POCT urinalysis dipstick  2. Acute midline low back pain without sciatica     Has maternity support belt - Culture, OB Urine - POCT urinalysis dipstick  3.  History of C-section     Repeat C-section with BTL scheduled for 04/19/17.   Preterm labor symptoms and general obstetric precautions including but not limited to vaginal bleeding, contractions, leaking of fluid and fetal movement were reviewed in detail with the patient. Please refer to After Visit Summary for other counseling recommendations.  Return in about 2 weeks (around 03/19/2017) for ROB.   Breanna CrockerCNM

## 2017-03-05 NOTE — Progress Notes (Signed)
Pt c/o urine having foul odor and lower back pain. She is requesting we check for UTI. Pt denies drinking water.

## 2017-03-11 LAB — CULTURE, OB URINE

## 2017-03-11 LAB — URINE CULTURE, OB REFLEX

## 2017-03-12 ENCOUNTER — Other Ambulatory Visit: Payer: Self-pay | Admitting: Certified Nurse Midwife

## 2017-03-12 DIAGNOSIS — O2343 Unspecified infection of urinary tract in pregnancy, third trimester: Secondary | ICD-10-CM | POA: Insufficient documentation

## 2017-03-12 MED ORDER — AMOXICILLIN-POT CLAVULANATE 875-125 MG PO TABS: 1.0000 | ORAL_TABLET | Freq: Two times a day (BID) | ORAL | 0 refills | Status: DC

## 2017-03-13 ENCOUNTER — Other Ambulatory Visit: Payer: Self-pay | Admitting: Certified Nurse Midwife

## 2017-03-13 ENCOUNTER — Ambulatory Visit (HOSPITAL_COMMUNITY)
Admission: RE | Admit: 2017-03-13 | Discharge: 2017-03-13 | Disposition: A | Discharge status: Home / Self Care | Payer: Medicaid Other | Source: Ambulatory Visit | Attending: Certified Nurse Midwife | Admitting: Certified Nurse Midwife

## 2017-03-13 DIAGNOSIS — Z362 Encounter for other antenatal screening follow-up: Secondary | ICD-10-CM | POA: Insufficient documentation

## 2017-03-13 DIAGNOSIS — O09293 Supervision of pregnancy with other poor reproductive or obstetric history, third trimester: Secondary | ICD-10-CM | POA: Insufficient documentation

## 2017-03-13 DIAGNOSIS — Z3480 Encounter for supervision of other normal pregnancy, unspecified trimester: Secondary | ICD-10-CM

## 2017-03-13 DIAGNOSIS — Z3A35 35 weeks gestation of pregnancy: Secondary | ICD-10-CM | POA: Insufficient documentation

## 2017-03-13 DIAGNOSIS — O34219 Maternal care for unspecified type scar from previous cesarean delivery: Secondary | ICD-10-CM | POA: Insufficient documentation

## 2017-03-13 DIAGNOSIS — Z3A33 33 weeks gestation of pregnancy: Secondary | ICD-10-CM

## 2017-03-13 DIAGNOSIS — O99213 Obesity complicating pregnancy, third trimester: Secondary | ICD-10-CM | POA: Insufficient documentation

## 2017-03-13 DIAGNOSIS — O09213 Supervision of pregnancy with history of pre-term labor, third trimester: Secondary | ICD-10-CM | POA: Insufficient documentation

## 2017-03-13 DIAGNOSIS — O09299 Supervision of pregnancy with other poor reproductive or obstetric history, unspecified trimester: Secondary | ICD-10-CM

## 2017-03-15 ENCOUNTER — Other Ambulatory Visit: Payer: Self-pay | Admitting: Certified Nurse Midwife

## 2017-03-16 IMAGING — US US OB TRANSVAGINAL
1 series · 15 of 23 positions shown · non-contrast
Comparison: Pelvic ultrasound 06/21/2015.

CLINICAL DATA: Pregnant patient with abdominal pain.

EXAM:
TRANSVAGINAL OB ULTRASOUND
TECHNIQUE: Transvaginal ultrasound was performed for complete evaluation of the
gestation as well as the maternal uterus, adnexal regions, and
pelvic cul-de-sac.

[Series 1: us ob transvaginal · 23 acquisitions, 15 frames shown]
[im 1/23]
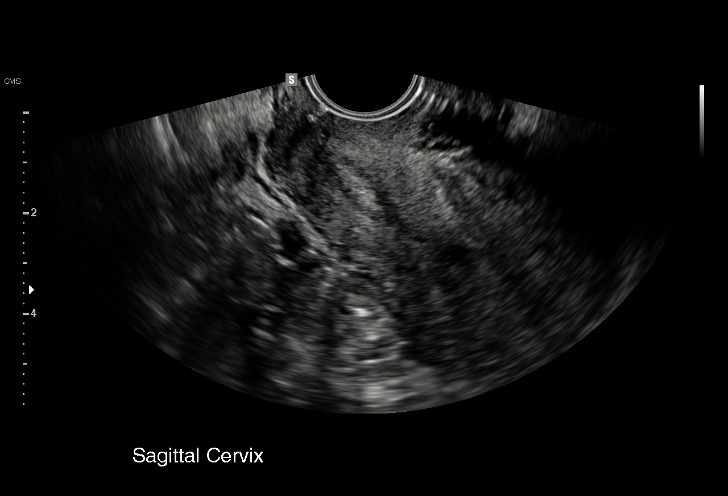
[im 3/23]
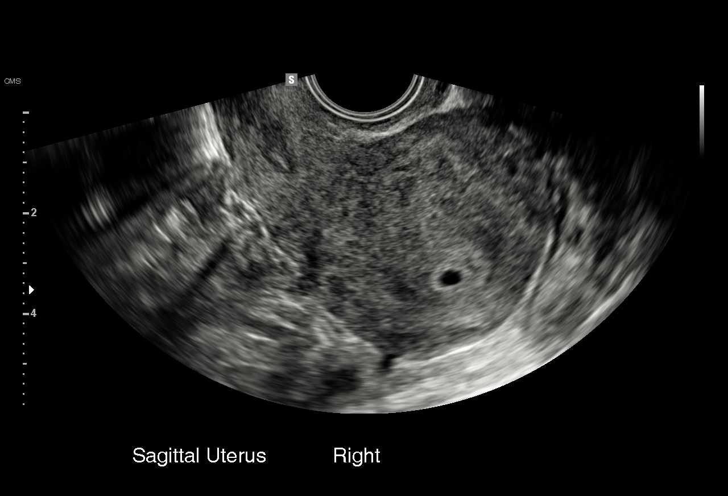
[im 4/23]
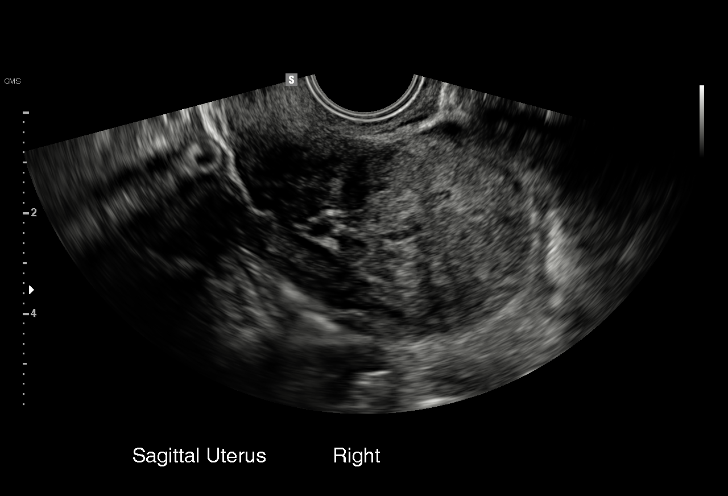
[im 6/23]
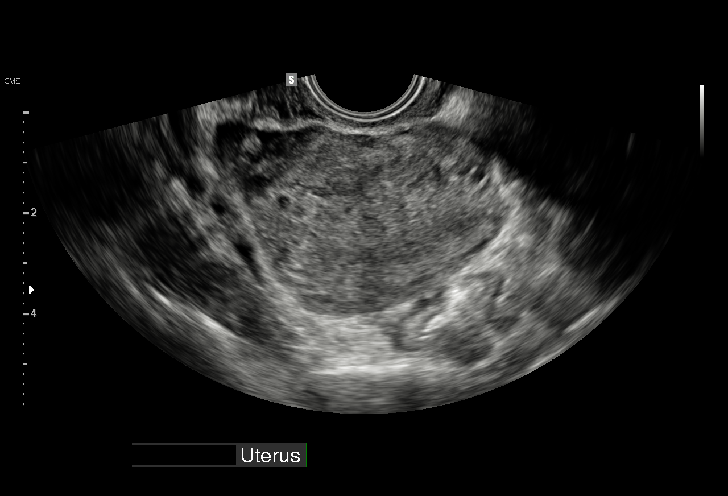
[im 7/23]
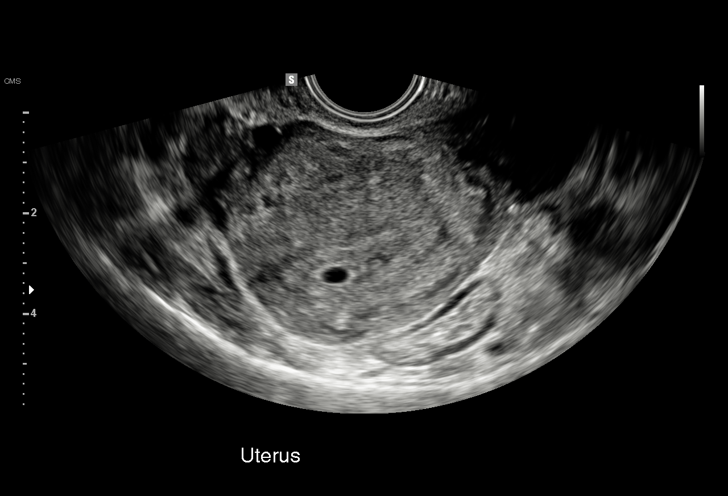
[im 9/23]
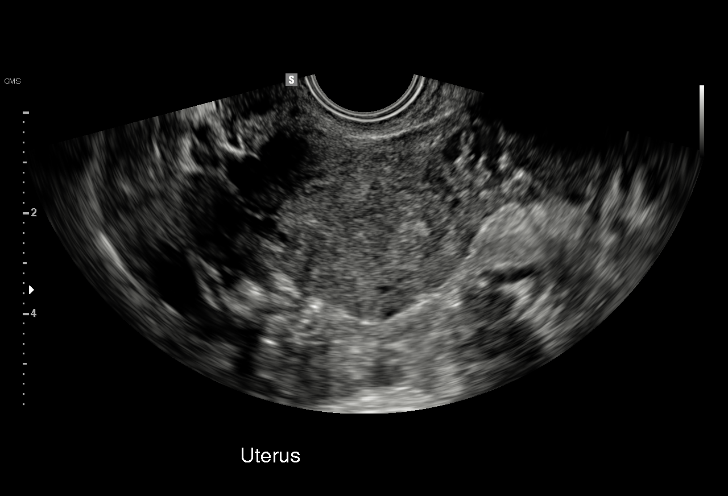
[im 10/23]
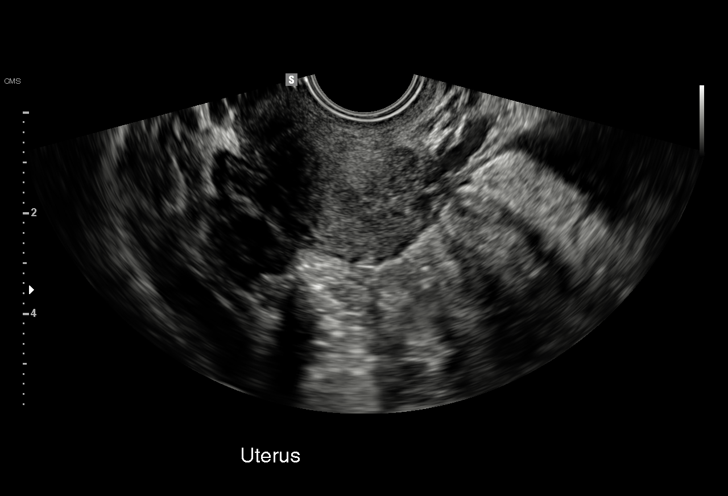
[im 12/23]
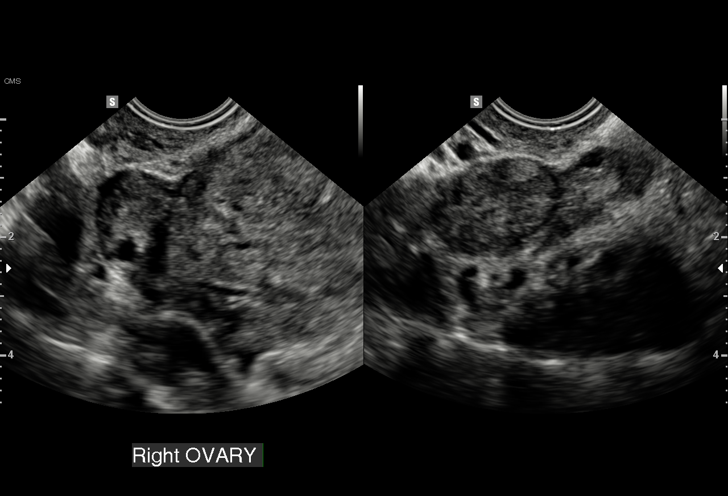
[im 14/23]
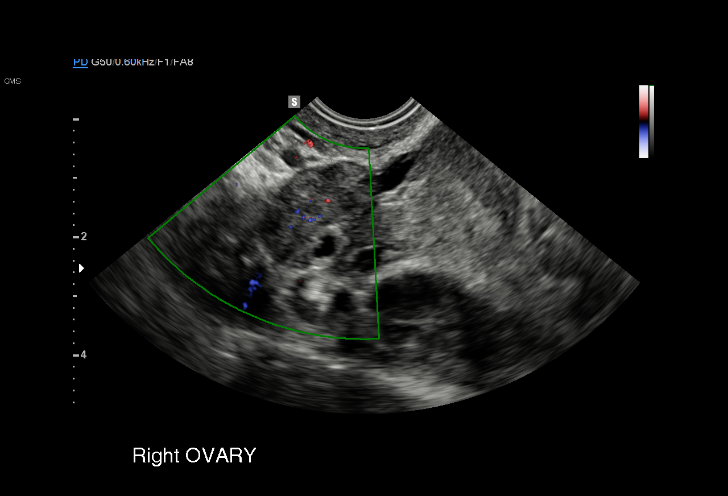
[im 15/23]
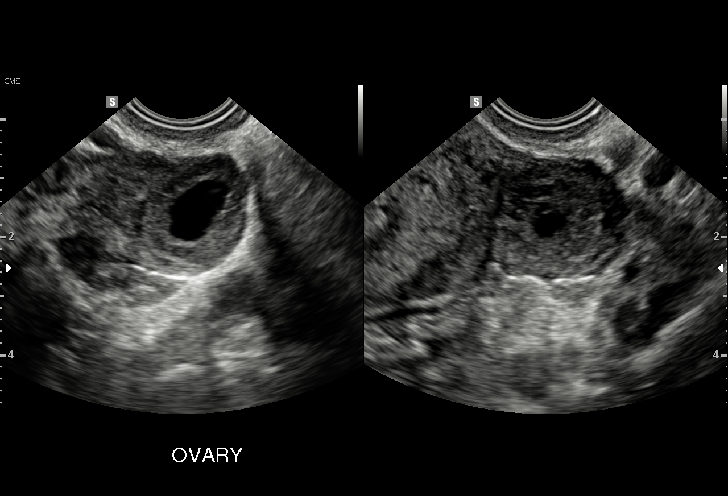
[im 17/23]
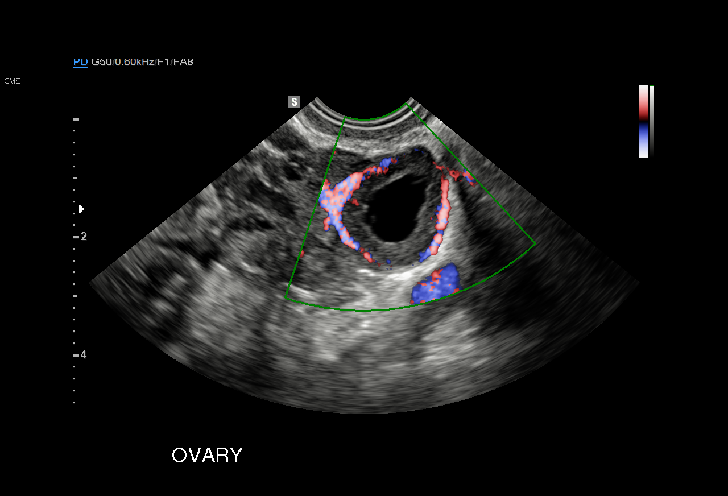
[im 18/23]
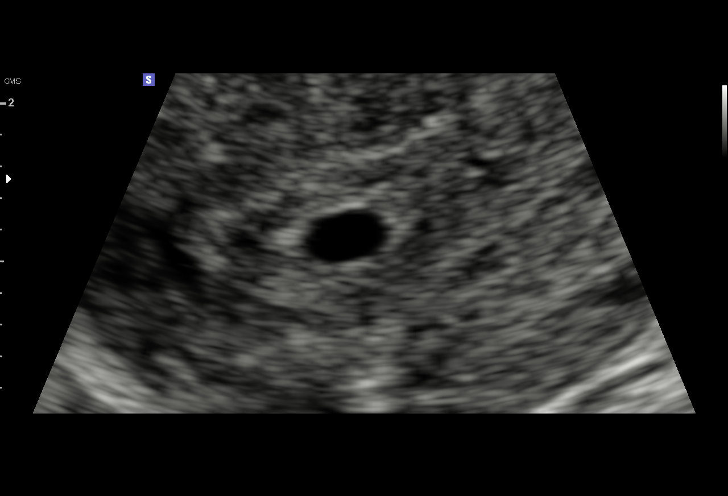
[im 20/23]
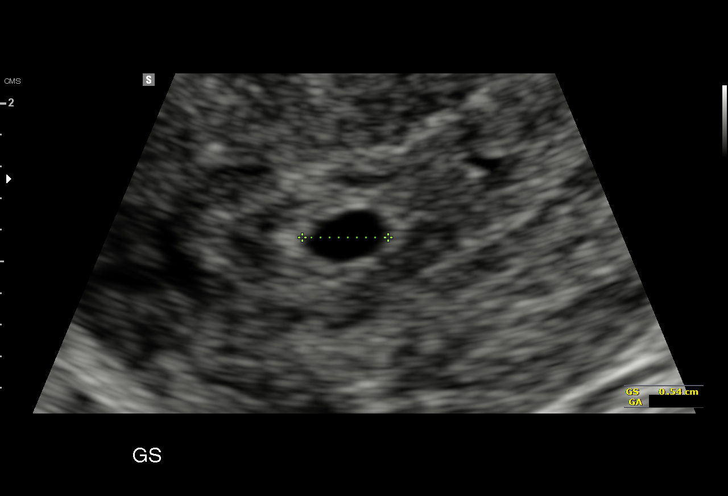
[im 21/23]
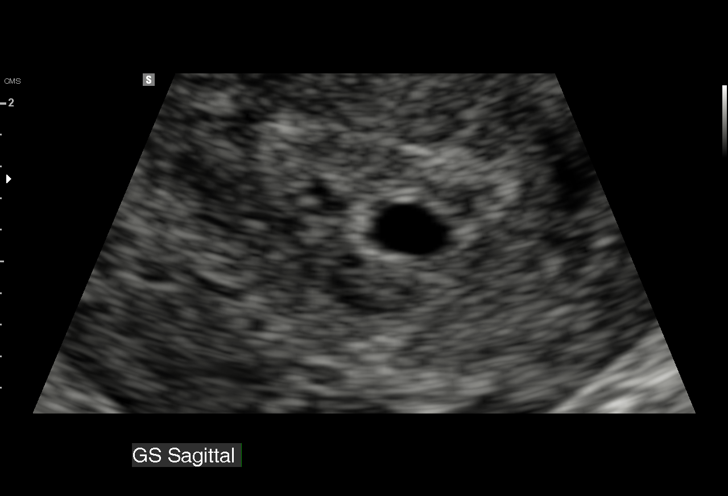
[im 23/23]
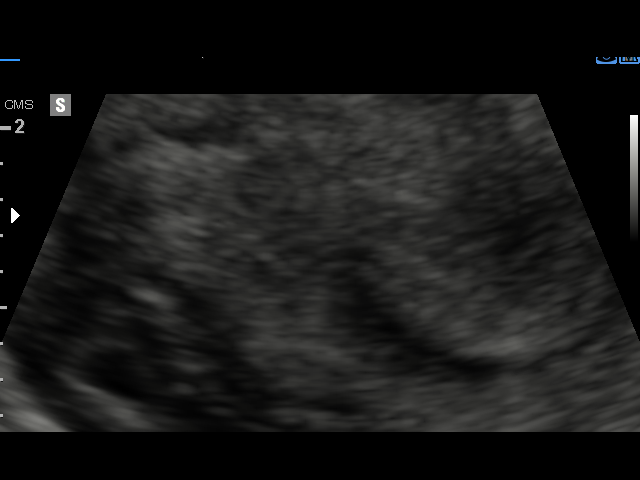

[15 of 23 positions shown; findings below may reference images not displayed]

FINDINGS: Intrauterine gestational sac: Present

Yolk sac:  Not present

Embryo:  Not present

Cardiac Activity: Not present

MSD: 4.7  mm   5 w   2  d

Subchorionic hemorrhage:  None visualized.

Maternal uterus/adnexae: Normal right and left ovaries. Probable
corpus luteum within the left ovary.
IMPRESSION: Probable early intrauterine gestational sac, but no yolk sac, fetal
pole, or cardiac activity yet visualized. Recommend follow-up
quantitative B-HCG levels and follow-up US in 14 days to confirm and
assess viability. This recommendation follows SRU consensus
guidelines: Diagnostic Criteria for Nonviable Pregnancy Early in the
First Trimester. N Engl J Med 1575; [DATE].

## 2017-03-19 ENCOUNTER — Encounter: Payer: Medicaid Other | Admitting: Certified Nurse Midwife

## 2017-03-21 ENCOUNTER — Encounter (HOSPITAL_COMMUNITY): Payer: Self-pay | Admitting: Obstetrics and Gynecology

## 2017-03-21 ENCOUNTER — Other Ambulatory Visit: Payer: Self-pay

## 2017-03-21 ENCOUNTER — Ambulatory Visit (INDEPENDENT_AMBULATORY_CARE_PROVIDER_SITE_OTHER): Payer: Medicaid Other | Admitting: Obstetrics and Gynecology

## 2017-03-21 ENCOUNTER — Inpatient Hospital Stay (HOSPITAL_COMMUNITY)
Admission: AD | Admit: 2017-03-21 | Discharge: 2017-03-21 | Disposition: A | Discharge status: Home / Self Care | Payer: Medicaid Other | Source: Ambulatory Visit | Attending: Obstetrics and Gynecology | Admitting: Obstetrics and Gynecology

## 2017-03-21 VITALS — BP 126/78 | HR 107 | Wt 249.0 lb

## 2017-03-21 DIAGNOSIS — O09299 Supervision of pregnancy with other poor reproductive or obstetric history, unspecified trimester: Secondary | ICD-10-CM

## 2017-03-21 DIAGNOSIS — Z3A34 34 weeks gestation of pregnancy: Secondary | ICD-10-CM | POA: Insufficient documentation

## 2017-03-21 DIAGNOSIS — Z98891 History of uterine scar from previous surgery: Secondary | ICD-10-CM

## 2017-03-21 DIAGNOSIS — O1493 Unspecified pre-eclampsia, third trimester: Secondary | ICD-10-CM

## 2017-03-21 DIAGNOSIS — Z8759 Personal history of other complications of pregnancy, childbirth and the puerperium: Secondary | ICD-10-CM

## 2017-03-21 DIAGNOSIS — O34219 Maternal care for unspecified type scar from previous cesarean delivery: Secondary | ICD-10-CM | POA: Diagnosis not present

## 2017-03-21 DIAGNOSIS — B951 Streptococcus, group B, as the cause of diseases classified elsewhere: Secondary | ICD-10-CM

## 2017-03-21 DIAGNOSIS — R51 Headache: Secondary | ICD-10-CM | POA: Diagnosis present

## 2017-03-21 DIAGNOSIS — Z7982 Long term (current) use of aspirin: Secondary | ICD-10-CM | POA: Diagnosis not present

## 2017-03-21 DIAGNOSIS — Z87891 Personal history of nicotine dependence: Secondary | ICD-10-CM | POA: Diagnosis not present

## 2017-03-21 DIAGNOSIS — R197 Diarrhea, unspecified: Secondary | ICD-10-CM

## 2017-03-21 DIAGNOSIS — Z3480 Encounter for supervision of other normal pregnancy, unspecified trimester: Secondary | ICD-10-CM

## 2017-03-21 DIAGNOSIS — Z3009 Encounter for other general counseling and advice on contraception: Secondary | ICD-10-CM | POA: Insufficient documentation

## 2017-03-21 DIAGNOSIS — O2341 Unspecified infection of urinary tract in pregnancy, first trimester: Secondary | ICD-10-CM

## 2017-03-21 DIAGNOSIS — R112 Nausea with vomiting, unspecified: Secondary | ICD-10-CM

## 2017-03-21 HISTORY — DX: Unspecified pre-eclampsia, third trimester: O14.93

## 2017-03-21 LAB — CBC
HCT: 36.2 % (ref 36.0–46.0)
Hemoglobin: 12.1 g/dL (ref 12.0–15.0)
MCH: 29.6 pg (ref 26.0–34.0)
MCHC: 33.4 g/dL (ref 30.0–36.0)
MCV: 88.5 fL (ref 78.0–100.0)
Platelets: 237 10*3/uL (ref 150–400)
RBC: 4.09 MIL/uL (ref 3.87–5.11)
RDW: 12.9 % (ref 11.5–15.5)
WBC: 7.9 10*3/uL (ref 4.0–10.5)

## 2017-03-21 LAB — URINALYSIS, ROUTINE W REFLEX MICROSCOPIC
BILIRUBIN URINE: NEGATIVE
Glucose, UA: NEGATIVE mg/dL
Hgb urine dipstick: NEGATIVE
KETONES UR: NEGATIVE mg/dL
LEUKOCYTES UA: NEGATIVE
Nitrite: NEGATIVE
Protein, ur: 100 mg/dL — AB
SPECIFIC GRAVITY, URINE: 1.023 (ref 1.005–1.030)
pH: 5 (ref 5.0–8.0)

## 2017-03-21 LAB — COMPREHENSIVE METABOLIC PANEL
ALBUMIN: 2.7 g/dL — AB (ref 3.5–5.0)
ALK PHOS: 172 U/L — AB (ref 38–126)
ALT: 11 U/L — ABNORMAL LOW (ref 14–54)
ANION GAP: 11 (ref 5–15)
AST: 19 U/L (ref 15–41)
BILIRUBIN TOTAL: 0.3 mg/dL (ref 0.3–1.2)
BUN: 10 mg/dL (ref 6–20)
CALCIUM: 8.9 mg/dL (ref 8.9–10.3)
CO2: 18 mmol/L — AB (ref 22–32)
Chloride: 105 mmol/L (ref 101–111)
Creatinine, Ser: 0.72 mg/dL (ref 0.44–1.00)
GFR calc non Af Amer: >60 mL/min (ref >60)
GLUCOSE: 131 mg/dL — AB (ref 65–99)
POTASSIUM: 3.6 mmol/L (ref 3.5–5.1)
SODIUM: 134 mmol/L — AB (ref 135–145)
TOTAL PROTEIN: 7.2 g/dL (ref 6.5–8.1)

## 2017-03-21 LAB — PROTEIN / CREATININE RATIO, URINE
Creatinine, Urine: 187 mg/dL
PROTEIN CREATININE RATIO: 0.44 mg/mg{creat} — AB (ref 0.00–0.15)
TOTAL PROTEIN, URINE: 83 mg/dL

## 2017-03-21 MED ORDER — OXYCODONE-ACETAMINOPHEN 5-325 MG PO TABS
2.0000 | ORAL_TABLET | Freq: Once | ORAL | Status: AC
  Administered 2017-03-21: 2 via ORAL
  Filled 2017-03-21: qty 2

## 2017-03-21 NOTE — Progress Notes (Signed)
Pt complains of having blurred vision and headaches for about 2 weeks. She has been taking tylenol for the headaches.

## 2017-03-21 NOTE — Discharge Instructions (Signed)
RETURN TO MATERNITY ADMISSIONS IF YOUR HEADACHE RETURNS AND NOT RELIEVED BY TYLENOL. CONTINUE TAKING TYLENOL FOR HEADACHES. YOU CAN TAKE TYLENOL 1000 MG EVERY 6 HOURS AS NEEDED FOR HEADACHE.

## 2017-03-21 NOTE — MAU Note (Signed)
Pt sent from clinic for PIH eval. C/o headache and blurred vision for  A few weeks.

## 2017-03-21 NOTE — Progress Notes (Signed)
   PRENATAL VISIT NOTE  Subjective:  [name] is a 26 y.o. [phone] at 9865w6d being seen today for ongoing prenatal care.  She is currently monitored for the following issues for this low-risk pregnancy and has OBESITY; TOBACCO USE, QUIT; Supervision of other normal pregnancy, antepartum; History of C-section; History of pregnancy induced hypertension; Nausea vomiting and diarrhea; Urinary tract infection in mother during third trimester of pregnancy; Unwanted fertility; and H/O pre-eclampsia in prior pregnancy, currently pregnant on their problem list.  Patient reports some discharge and cramping.  Contractions: Irregular. Vag. Bleeding: None.  Movement: Present. Denies leaking of fluid. She reports headaches for about 2 weeks, they are off and on, generally improve with tylenol although she does have periods where she goes to sleep with a headache and wakes up with a headache. She does not have a history of migraines. She does have blurred vision with her headaches, to the point where she has to pull over and stop driving because her vision gets so bad. She has stopped driving and has her mother driver her around because her vision gets so bad during headaches. Denies nausea/vomiting.    The following portions of the patient's history were reviewed and updated as appropriate: allergies, current medications, past family history, past medical history, past social history, past surgical history and problem list. Problem list updated.  Objective:   Vitals:   03/21/17 1637  BP: 126/78  Pulse: (!) 107  Weight: 249 lb (112.9 kg)    Fetal Status: Fetal Heart Rate (bpm): 145   Movement: Present     General:  Alert, oriented and cooperative. Patient is in no acute distress.  Skin: Skin is warm and dry. No rash noted.   Cardiovascular: Normal heart rate noted  Respiratory: Normal respiratory effort, no problems with respiration noted  Abdomen: Soft, gravid, appropriate for gestational age.   Pain/Pressure: Absent     Pelvic: Cervical exam deferred        Extremities: Normal range of motion.  Edema: Trace  Mental Status:  Normal mood and affect. Normal behavior. Normal judgment and thought content.   Assessment and Plan:  Pregnancy: [id number] at 6865w6d  1. Group B Streptococcus urinary tract infection affecting pregnancy in first trimester  2. History of C-section For RCS+BTL  3. Supervision of other normal pregnancy, antepartum  4. History of pregnancy induced hypertension  5. Unwanted fertility BTL papers signed 01/2017  6. H/O pre-eclampsia in prior pregnancy, currently pregnant Has been on baby ASA  Patient with significant headaches over the last 2 weeks with blurry vision. To MAU for rule out PEC.  Preterm labor symptoms and general obstetric precautions including but not limited to vaginal bleeding, contractions, leaking of fluid and fetal movement were reviewed in detail with the patient. Please refer to After Visit Summary for other counseling recommendations.  Return in about 1 week (around 03/28/2017) for OB visit (MD).   Conan BowensKelly M Davis, MD

## 2017-03-21 NOTE — MAU Provider Note (Signed)
History     CSN: [id number]  Arrival date and time: 03/21/17 1718   First Provider Initiated Contact with Patient 03/21/17 1815      Chief Complaint  Patient presents with  . Headache  . Blurred Vision   HPI  Ms. Tommi EmeryMary E Bilton is a 26 y.o. [phone] at [redacted]w[redacted]d gestation [facility]. She has normal BPs in the office. While in the office she complained of a persistent H/A "for several weeks" that is relieved by Tylenol, but "always" comes back later. She has had a previous C/S at 35 wks d/t severe PEC.   Past Medical History:  Diagnosis Date  . Bacterial vaginosis 08/15/2016  . Headache(784.0)   . Hypertension   . Incompetent cervix   . Pregnancy induced hypertension   . Preterm labor   . Urinary tract infection     Past Surgical History:  Procedure Laterality Date  . BREAST FIBROADENOMA SURGERY  2006-7.   Removal;right  . CESAREAN SECTION    . DILATION AND CURETTAGE OF UTERUS    . THERAPEUTIC ABORTION      Family History  Problem Relation Age of Onset  . Diabetes Mother   . Diabetes Maternal Grandmother   . Hypertension Maternal Grandmother   . Cancer Maternal Grandmother        colon cancer, brain tumour  . Other Neg Hx     Social History   Tobacco Use  . Smoking status: Former Smoker    Packs/day: 0.25    Types: Cigarettes    Last attempt to quit: 08/17/2016    Years since quitting: 0.5  . Smokeless tobacco: Former Engineer, waterUser  Substance Use Topics  . Alcohol use: No    Alcohol/week: 0.0 oz  . Drug use: No    Comment: daily , recently started 2 months prior    Allergies: No Known Allergies  Medications Prior to Admission  Medication Sig Dispense Refill Last Dose  . acetaminophen (TYLENOL) 500 MG tablet Take 500 mg by mouth every 6 (six) hours as needed for mild pain or headache.   Taking  . aspirin 81 MG chewable tablet Chew 1 tablet (81 mg total) by mouth daily. 30 tablet 12 Taking  . Elastic Bandages & Supports (COMFORT FIT  MATERNITY SUPP LG) MISC 1 Units by Does not apply route daily. 1 each 0 Taking  . ondansetron (ZOFRAN ODT) 4 MG disintegrating tablet Take 1 tablet (4 mg total) every 8 (eight) hours as needed by mouth for nausea or vomiting. 20 tablet 0 Taking  . Prenatal-Fe Fum-Methf-FA w/o A (VITAFOL-NANO) 18-0.6-0.4 MG TABS Take 1 tablet by mouth daily. 30 tablet 12 Taking    Review of Systems  Constitutional: Negative.   HENT: Negative.   Eyes: Positive for visual disturbance (blurred vision).  Respiratory: Negative.   Cardiovascular: Negative.   Gastrointestinal: Negative.   Endocrine: Negative.   Genitourinary: Negative.   Musculoskeletal: Negative.   Skin: Negative.   Allergic/Immunologic: Negative.   Neurological: Positive for headaches.  Hematological: Negative.   Psychiatric/Behavioral: Negative.    Physical Exam   Patient Vitals for the past 24 hrs:  BP Temp Pulse Resp Height Weight  03/21/17 1901 102/72 - (!) 108 - - -  03/21/17 1846 109/71 - (!) 118 - - -  03/21/17 1831 104/74 - (!) 112 - - -  03/21/17 1816 100/76 - - - - -  03/21/17 1809 (!) 77/48 - (!) 117 - - -  03/21/17 1755  137/82 98.7 F (37.1 C) (!) 110 18 5\' 4"  (1.626 m) 249 lb (112.9 kg)    Blood pressure 137/82, pulse (!) 110, temperature 98.7 F (37.1 C), resp. rate 18, height 5\' 4"  (1.626 m), weight 249 lb (112.9 kg), last menstrual period 07/11/2016.  Physical Exam  Nursing note and vitals reviewed. Constitutional: She is oriented to person, place, and time. She appears well-developed and well-nourished.  HENT:  Head: Normocephalic.  Eyes: Pupils are equal, round, and reactive to light.  Neck: Normal range of motion.  Cardiovascular: Normal rate, regular rhythm and normal heart sounds.  Respiratory: Effort normal and breath sounds normal.  GI: Soft. Bowel sounds are normal.  Musculoskeletal: Normal range of motion.  Neurological: She is alert and oriented to person, place, and time.  Skin: Skin is warm and  dry.  Psychiatric: She has a normal mood and affect. Her behavior is normal. Judgment and thought content normal.    MAU Course  Procedures  MDM CCUA CBC CMP P/C Ratio Percocet 2 tablets -- relieved pain NST - FHR: 150 bpm / moderate variability / accels present / decels absent / TOCO: irregular UC's with UI noted  *Consult with Dr. Alysia Penna @ 1935 - notified of patient's complaints, assessments, lab results, recommended tx plan return to MAU for repeat BP check and PEC labs and call [facility] to schedule OB appt on Monday 12/17 or Tuesday 12/18; no later than Wednesday 12/19.  Results for orders placed or performed during the hospital encounter of 03/21/17 (from the past 24 hour(s))  Urinalysis, Routine w reflex microscopic     Status: Abnormal   Collection Time: 03/21/17  5:50 PM  Result Value Ref Range   Color, Urine YELLOW YELLOW   APPearance HAZY (A) CLEAR   Specific Gravity, Urine 1.023 1.005 - 1.030   pH 5.0 5.0 - 8.0   Glucose, UA NEGATIVE NEGATIVE mg/dL   Hgb urine dipstick NEGATIVE NEGATIVE   Bilirubin Urine NEGATIVE NEGATIVE   Ketones, ur NEGATIVE NEGATIVE mg/dL   Protein, ur 161 (A) NEGATIVE mg/dL   Nitrite NEGATIVE NEGATIVE   Leukocytes, UA NEGATIVE NEGATIVE   RBC / HPF 0-5 0 - 5 RBC/hpf   WBC, UA 0-5 0 - 5 WBC/hpf   Bacteria, UA FEW (A) NONE SEEN   Squamous Epithelial / LPF 0-5 (A) NONE SEEN   Mucus PRESENT   Protein / creatinine ratio, urine     Status: Abnormal   Collection Time: 03/21/17  5:50 PM  Result Value Ref Range   Creatinine, Urine 187.00 mg/dL   Total Protein, Urine 83 mg/dL   Protein Creatinine Ratio 0.44 (H) 0.00 - 0.15 mg/mg[Cre]  CBC     Status: None   Collection Time: 03/21/17  6:18 PM  Result Value Ref Range   WBC 7.9 4.0 - 10.5 K/uL   RBC 4.09 3.87 - 5.11 MIL/uL   Hemoglobin 12.1 12.0 - 15.0 g/dL   HCT 09.6 04.5 - 40.9 %   MCV 88.5 78.0 - 100.0 fL   MCH 29.6 26.0 - 34.0 pg   MCHC 33.4 30.0 - 36.0 g/dL   RDW 81.1 91.4 - 78.2 %    Platelets 237 150 - 400 K/uL  Comprehensive metabolic panel     Status: Abnormal   Collection Time: 03/21/17  6:18 PM  Result Value Ref Range   Sodium 134 (L) 135 - 145 mmol/L   Potassium 3.6 3.5 - 5.1 mmol/L   Chloride 105 101 - 111 mmol/L   CO2  18 (L) 22 - 32 mmol/L   Glucose, Bld 131 (H) 65 - 99 mg/dL   BUN 10 6 - 20 mg/dL   Creatinine, Ser 1.610.72 0.44 - 1.00 mg/dL   Calcium 8.9 8.9 - 09.610.3 mg/dL   Total Protein 7.2 6.5 - 8.1 g/dL   Albumin 2.7 (L) 3.5 - 5.0 g/dL   AST 19 15 - 41 U/L   ALT 11 (L) 14 - 54 U/L   Alkaline Phosphatase 172 (H) 38 - 126 U/L   Total Bilirubin 0.3 0.3 - 1.2 mg/dL   GFR calc non Af Amer >60 >60 mL/min   GFR calc Af Amer >60 >60 mL/min   Anion gap 11 5 - 15      Assessment and Plan  Preeclampsia, third trimester - Return to MAU for repeat PEC labs and serial BPs - Call [facility] Monday to schedule to be seen on Tuesday 12/18, no later than Wednesday 12/19 - Information provided on PEC - Advised to return to MAU for any s/s's of PEC  - Can take Tylenol 1000 mg po every 6 hrs for H/A - Advised to return to MAU, if H/A not relieved by Tylenol  Discharge home Patient verbalized an understanding of the plan of care and agrees.   Raelyn Moraolitta Rendell Thivierge, MSN, CNM 03/21/2017, 6:16 PM

## 2017-03-23 ENCOUNTER — Encounter (HOSPITAL_COMMUNITY): Payer: Self-pay | Admitting: *Deleted

## 2017-03-23 ENCOUNTER — Inpatient Hospital Stay (HOSPITAL_COMMUNITY)
Admission: AD | Admit: 2017-03-23 | Discharge: 2017-03-23 | Disposition: A | Discharge status: Home / Self Care | Payer: Medicaid Other | Source: Ambulatory Visit | Attending: Obstetrics and Gynecology | Admitting: Obstetrics and Gynecology

## 2017-03-23 DIAGNOSIS — Z3A35 35 weeks gestation of pregnancy: Secondary | ICD-10-CM | POA: Diagnosis not present

## 2017-03-23 DIAGNOSIS — Z87891 Personal history of nicotine dependence: Secondary | ICD-10-CM | POA: Diagnosis not present

## 2017-03-23 DIAGNOSIS — Z3A31 31 weeks gestation of pregnancy: Secondary | ICD-10-CM

## 2017-03-23 DIAGNOSIS — R112 Nausea with vomiting, unspecified: Secondary | ICD-10-CM

## 2017-03-23 DIAGNOSIS — O1493 Unspecified pre-eclampsia, third trimester: Secondary | ICD-10-CM | POA: Diagnosis not present

## 2017-03-23 DIAGNOSIS — O09299 Supervision of pregnancy with other poor reproductive or obstetric history, unspecified trimester: Secondary | ICD-10-CM

## 2017-03-23 DIAGNOSIS — Z3480 Encounter for supervision of other normal pregnancy, unspecified trimester: Secondary | ICD-10-CM

## 2017-03-23 DIAGNOSIS — O1490 Unspecified pre-eclampsia, unspecified trimester: Secondary | ICD-10-CM

## 2017-03-23 DIAGNOSIS — O212 Late vomiting of pregnancy: Secondary | ICD-10-CM | POA: Insufficient documentation

## 2017-03-23 DIAGNOSIS — R197 Diarrhea, unspecified: Secondary | ICD-10-CM

## 2017-03-23 DIAGNOSIS — O26893 Other specified pregnancy related conditions, third trimester: Secondary | ICD-10-CM | POA: Diagnosis present

## 2017-03-23 LAB — COMPREHENSIVE METABOLIC PANEL
ALBUMIN: 2.6 g/dL — AB (ref 3.5–5.0)
ALK PHOS: 183 U/L — AB (ref 38–126)
ALT: 12 U/L — AB (ref 14–54)
AST: 18 U/L (ref 15–41)
Anion gap: 8 (ref 5–15)
BILIRUBIN TOTAL: 0.2 mg/dL — AB (ref 0.3–1.2)
BUN: 7 mg/dL (ref 6–20)
CO2: 22 mmol/L (ref 22–32)
Calcium: 9 mg/dL (ref 8.9–10.3)
Chloride: 103 mmol/L (ref 101–111)
Creatinine, Ser: 0.72 mg/dL (ref 0.44–1.00)
GFR calc Af Amer: >60 mL/min (ref >60)
GFR calc non Af Amer: >60 mL/min (ref >60)
GLUCOSE: 121 mg/dL — AB (ref 65–99)
POTASSIUM: 3.6 mmol/L (ref 3.5–5.1)
Sodium: 133 mmol/L — ABNORMAL LOW (ref 135–145)
TOTAL PROTEIN: 6.8 g/dL (ref 6.5–8.1)

## 2017-03-23 LAB — CBC WITH DIFFERENTIAL/PLATELET
BASOS ABS: 0 10*3/uL (ref 0.0–0.1)
BASOS PCT: 0 %
Eosinophils Absolute: 0.1 10*3/uL (ref 0.0–0.7)
Eosinophils Relative: 1 %
HEMATOCRIT: 38.1 % (ref 36.0–46.0)
HEMOGLOBIN: 12.5 g/dL (ref 12.0–15.0)
Lymphocytes Relative: 22 %
Lymphs Abs: 1.7 10*3/uL (ref 0.7–4.0)
MCH: 29.4 pg (ref 26.0–34.0)
MCHC: 32.8 g/dL (ref 30.0–36.0)
MCV: 89.6 fL (ref 78.0–100.0)
Monocytes Absolute: 0.2 10*3/uL (ref 0.1–1.0)
Monocytes Relative: 2 %
NEUTROS ABS: 5.8 10*3/uL (ref 1.7–7.7)
Neutrophils Relative %: 75 %
Platelets: 221 10*3/uL (ref 150–400)
RBC: 4.25 MIL/uL (ref 3.87–5.11)
RDW: 12.9 % (ref 11.5–15.5)
WBC: 7.7 10*3/uL (ref 4.0–10.5)

## 2017-03-23 LAB — PROTEIN / CREATININE RATIO, URINE
Creatinine, Urine: 76 mg/dL
Protein Creatinine Ratio: 0.68 mg/mg{Cre} — ABNORMAL HIGH (ref 0.00–0.15)
Total Protein, Urine: 52 mg/dL

## 2017-03-23 NOTE — Discharge Instructions (Signed)
Preeclampsia and Eclampsia °Preeclampsia is a serious condition that develops only during pregnancy. It is also called toxemia of pregnancy. This condition causes high blood pressure along with other symptoms, such as swelling and headaches. These symptoms may develop as the condition gets worse. Preeclampsia may occur at 20 weeks of pregnancy or later. °Diagnosing and treating preeclampsia early is very important. If not treated early, it can cause serious problems for you and your baby. One problem it can lead to is eclampsia, which is a condition that causes muscle jerking or shaking (convulsions or seizures) in the mother. Delivering your baby is the best treatment for preeclampsia or eclampsia. Preeclampsia and eclampsia symptoms usually go away after your baby is born. °What are the causes? °The cause of preeclampsia is not known. °What increases the risk? °The following risk factors make you more likely to develop preeclampsia: °· Being pregnant for the first time. °· Having had preeclampsia during a past pregnancy. °· Having a family history of preeclampsia. °· Having high blood pressure. °· Being pregnant with twins or triplets. °· Being 35 or older. °· Being African-American. °· Having kidney disease or diabetes. °· Having medical conditions such as lupus or blood diseases. °· Being very overweight (obese). ° °What are the signs or symptoms? °The earliest signs of preeclampsia are: °· High blood pressure. °· Increased protein in your urine. Your health care provider will check for this at every visit before you give birth (prenatal visit). ° °Other symptoms that may develop as the condition gets worse include: °· Severe headaches. °· Sudden weight gain. °· Swelling of the hands, face, legs, and feet. °· Nausea and vomiting. °· Vision problems, such as blurred or double vision. °· Numbness in the face, arms, legs, and feet. °· Urinating less than usual. °· Dizziness. °· Slurred speech. °· Abdominal pain,  especially upper abdominal pain. °· Convulsions or seizures. ° °Symptoms generally go away after giving birth. °How is this diagnosed? °There are no screening tests for preeclampsia. Your health care provider will ask you about symptoms and check for signs of preeclampsia during your prenatal visits. You may also have tests that include: °· Urine tests. °· Blood tests. °· Checking your blood pressure. °· Monitoring your baby’s heart rate. °· Ultrasound. ° °How is this treated? °You and your health care provider will determine the treatment approach that is best for you. Treatment may include: °· Having more frequent prenatal exams to check for signs of preeclampsia, if you have an increased risk for preeclampsia. °· Bed rest. °· Reducing how much salt (sodium) you eat. °· Medicine to lower your blood pressure. °· Staying in the hospital, if your condition is severe. There, treatment will focus on controlling your blood pressure and the amount of fluids in your body (fluid retention). °· You may need to take medicine (magnesium sulfate) to prevent seizures. This medicine may be given as an injection or through an IV tube. °· Delivering your baby early, if your condition gets worse. You may have your labor started with medicine (induced), or you may have a cesarean delivery. ° °Follow these instructions at home: °Eating and drinking ° °· Drink enough fluid to keep your urine clear or pale yellow. °· Eat a healthy diet that is low in sodium. Do not add salt to your food. Check nutrition labels to see how much sodium a food or beverage contains. °· Avoid caffeine. °Lifestyle °· Do not use any products that contain nicotine or tobacco, such as cigarettes   and e-cigarettes. If you need help quitting, ask your health care provider. °· Do not use alcohol or drugs. °· Avoid stress as much as possible. Rest and get plenty of sleep. °General instructions °· Take over-the-counter and prescription medicines only as told by your  health care provider. °· When lying down, lie on your side. This keeps pressure off of your baby. °· When sitting or lying down, raise (elevate) your feet. Try putting some pillows underneath your lower legs. °· Exercise regularly. Ask your health care provider what kinds of exercise are best for you. °· Keep all follow-up and prenatal visits as told by your health care provider. This is important. °How is this prevented? °To prevent preeclampsia or eclampsia from developing during another pregnancy: °· Get proper medical care during pregnancy. Your health care provider may be able to prevent preeclampsia or diagnose and treat it early. °· Your health care provider may have you take a low-dose aspirin or a calcium supplement during your next pregnancy. °· You may have tests of your blood pressure and kidney function after giving birth. °· Maintain a healthy weight. Ask your health care provider for help managing weight gain during pregnancy. °· Work with your health care provider to manage any long-term (chronic) health conditions you have, such as diabetes or kidney problems. ° °Contact a health care provider if: °· You gain more weight than expected. °· You have headaches. °· You have nausea or vomiting. °· You have abdominal pain. °· You feel dizzy or light-headed. °Get help right away if: °· You develop sudden or severe swelling anywhere in your body. This usually happens in the legs. °· You gain 5 lbs (2.3 kg) or more during one week. °· You have severe: °? Abdominal pain. °? Headaches. °? Dizziness. °? Vision problems. °? Confusion. °? Nausea or vomiting. °· You have a seizure. °· You have trouble moving any part of your body. °· You develop numbness in any part of your body. °· You have trouble speaking. °· You have any abnormal bleeding. °· You pass out. °This information is not intended to replace advice given to you by your health care provider. Make sure you discuss any questions you have with your health  care provider. °Document Released: 03/23/2000 Document Revised: 11/22/2015 Document Reviewed: 10/31/2015 °[employer] Interactive Patient Education © 2018 [employer] ° °

## 2017-03-23 NOTE — MAU Note (Signed)
Urine sent to lab 

## 2017-03-23 NOTE — MAU Provider Note (Signed)
History   [id number] @ 35.1 wks in for follow up labs for PIH eval. Denies headache, blurred vision or epigastric pain. Denies vag bleeding or ROM.   CSN: [id number]  Arrival date & time 03/23/17  1119   None     Chief Complaint  Patient presents with  . Follow-up    HPI  Past Medical History:  Diagnosis Date  . Bacterial vaginosis 08/15/2016  . Headache(784.0)   . Hypertension   . Incompetent cervix   . Pregnancy induced hypertension   . Preterm labor   . Urinary tract infection     Past Surgical History:  Procedure Laterality Date  . BREAST FIBROADENOMA SURGERY  2006-7.   Removal;right  . CESAREAN SECTION    . DILATION AND CURETTAGE OF UTERUS    . THERAPEUTIC ABORTION      Family History  Problem Relation Age of Onset  . Diabetes Mother   . Diabetes Maternal Grandmother   . Hypertension Maternal Grandmother   . Cancer Maternal Grandmother        colon cancer, brain tumour  . Other Neg Hx     Social History   Tobacco Use  . Smoking status: Former Smoker    Packs/day: 0.25    Types: Cigarettes    Last attempt to quit: 08/17/2016    Years since quitting: 0.5  . Smokeless tobacco: Former Engineer, waterUser  Substance Use Topics  . Alcohol use: No    Alcohol/week: 0.0 oz  . Drug use: No    Comment: daily , recently started 2 months prior    OB History    Gravida Para Term Preterm AB Living   5 1   1 3 2    SAB TAB Ectopic Multiple Live Births   2 1   1 2       Obstetric Comments   D&C      Review of Systems  Constitutional: Negative.   HENT: Negative.   Eyes: Negative.   Respiratory: Negative.   Cardiovascular: Negative.   Gastrointestinal: Negative.   Endocrine: Negative.   Genitourinary: Negative.   Musculoskeletal: Negative.   Skin: Negative.   Allergic/Immunologic: Negative.   Neurological: Negative.   Hematological: Negative.   Psychiatric/Behavioral: Negative.     Allergies  Patient has no known allergies.  Home Medications    BP 117/75    Pulse (!) 103   Temp 98 F (36.7 C) (Oral)   Resp 17   Ht 5\' 4"  (1.626 m)   Wt 249 lb (112.9 kg)   LMP 07/11/2016 (Exact Date)   SpO2 99%   BMI 42.74 kg/m   Physical Exam  Constitutional: She is oriented to person, place, and time. She appears well-developed and well-nourished.  HENT:  Head: Normocephalic.  Eyes: Pupils are equal, round, and reactive to light.  Neck: Normal range of motion.  Cardiovascular: Normal rate, regular rhythm, normal heart sounds and intact distal pulses.  Pulmonary/Chest: Effort normal and breath sounds normal.  Abdominal: Soft. Bowel sounds are normal.  Genitourinary: Vagina normal and uterus normal.  Musculoskeletal: Normal range of motion.  Neurological: She is alert and oriented to person, place, and time. She has normal reflexes.  Skin: Skin is warm and dry.  Psychiatric: She has a normal mood and affect. Her behavior is normal. Judgment and thought content normal.    MAU Course  Procedures (including critical care time)  Labs Reviewed  COMPREHENSIVE METABOLIC PANEL - Abnormal; Notable for the following components:      Result  Value   Sodium 133 (*)    Glucose, Bld 121 (*)    Albumin 2.6 (*)    ALT 12 (*)    Alkaline Phosphatase 183 (*)    Total Bilirubin 0.2 (*)    All other components within normal limits  PROTEIN / CREATININE RATIO, URINE - Abnormal; Notable for the following components:   Protein Creatinine Ratio 0.68 (*)    All other components within normal limits  CBC WITH DIFFERENTIAL/PLATELET   No results found.   1. H/O pre-eclampsia in prior pregnancy, currently pregnant   2. Preeclampsia, third trimester   3. Nausea vomiting and diarrhea   4. Supervision of other normal pregnancy, antepartum       MDM  VSS, Labs stable, NST reactive. POC discussed with Dr. Jolayne Pantheronstant to follow up in clinic next week as scheduled.

## 2017-03-23 NOTE — MAU Note (Signed)
Was told to return here to check for preeclampsia. Reports lower abd pain.

## 2017-03-28 ENCOUNTER — Other Ambulatory Visit (HOSPITAL_COMMUNITY)
Admission: RE | Admit: 2017-03-28 | Discharge: 2017-03-28 | Disposition: A | Discharge status: Home / Self Care | Payer: Medicaid Other | Source: Ambulatory Visit | Attending: Certified Nurse Midwife | Admitting: Certified Nurse Midwife

## 2017-03-28 ENCOUNTER — Ambulatory Visit (INDEPENDENT_AMBULATORY_CARE_PROVIDER_SITE_OTHER): Payer: Medicaid Other | Admitting: Certified Nurse Midwife

## 2017-03-28 VITALS — BP 106/70 | HR 109 | Wt 250.0 lb

## 2017-03-28 DIAGNOSIS — B9689 Other specified bacterial agents as the cause of diseases classified elsewhere: Secondary | ICD-10-CM | POA: Diagnosis not present

## 2017-03-28 DIAGNOSIS — Z3480 Encounter for supervision of other normal pregnancy, unspecified trimester: Secondary | ICD-10-CM

## 2017-03-28 DIAGNOSIS — Z3483 Encounter for supervision of other normal pregnancy, third trimester: Secondary | ICD-10-CM

## 2017-03-28 DIAGNOSIS — B373 Candidiasis of vulva and vagina: Secondary | ICD-10-CM | POA: Insufficient documentation

## 2017-03-28 DIAGNOSIS — Z8759 Personal history of other complications of pregnancy, childbirth and the puerperium: Secondary | ICD-10-CM

## 2017-03-28 NOTE — Progress Notes (Signed)
Patient reports good fetal movement with some pressure and irregular contractions. 

## 2017-03-28 NOTE — Progress Notes (Signed)
   PRENATAL VISIT NOTE  Subjective:  [name] is a 26 y.o. [phone] at 6369w6d being seen today for ongoing prenatal care.  She is currently monitored for the following issues for this low-risk pregnancy and has OBESITY; TOBACCO USE, QUIT; Supervision of other normal pregnancy, antepartum; History of C-section; History of pregnancy induced hypertension; Nausea vomiting and diarrhea; Urinary tract infection in mother during third trimester of pregnancy; Unwanted fertility; H/O pre-eclampsia in prior pregnancy, currently pregnant; and Preeclampsia, third trimester on their problem list.  Patient reports no complaints.  Contractions: Irregular. Vag. Bleeding: None.  Movement: Present. Denies leaking of fluid.   The following portions of the patient's history were reviewed and updated as appropriate: allergies, current medications, past family history, past medical history, past social history, past surgical history and problem list. Problem list updated.  Objective:   Vitals:   03/28/17 1334  BP: 106/70  Pulse: (!) 109  Weight: 250 lb (113.4 kg)    Fetal Status: Fetal Heart Rate (bpm): 150; doppler Fundal Height: 36 cm Movement: Present  Presentation: Vertex  General:  Alert, oriented and cooperative. Patient is in no acute distress.  Skin: Skin is warm and dry. No rash noted.   Cardiovascular: Normal heart rate noted  Respiratory: Normal respiratory effort, no problems with respiration noted  Abdomen: Soft, gravid, appropriate for gestational age.  Pain/Pressure: Present     Pelvic: Cervical exam performed Dilation: Closed Effacement (%): 0 Station: Ballotable  Extremities: Normal range of motion.  Edema: Trace  Mental Status:  Normal mood and affect. Normal behavior. Normal judgment and thought content.   Assessment and Plan:  Pregnancy: [id number] at 8869w6d  1. Supervision of other normal pregnancy, antepartum     Doing well - Strep Gp B NAA - Cervicovaginal ancillary only  2.  History of pregnancy induced hypertension     Completed baby ASA tx.  Preterm labor symptoms and general obstetric precautions including but not limited to vaginal bleeding, contractions, leaking of fluid and fetal movement were reviewed in detail with the patient. Please refer to After Visit Summary for other counseling recommendations.  Return in about 1 week (around 04/04/2017) for ROB.   Roe Coombsachelle A Loranzo Desha, CNM

## 2017-03-29 LAB — CERVICOVAGINAL ANCILLARY ONLY
BACTERIAL VAGINITIS: POSITIVE — AB
CANDIDA VAGINITIS: POSITIVE — AB
CHLAMYDIA, DNA PROBE: NEGATIVE
Neisseria Gonorrhea: NEGATIVE
Trichomonas: NEGATIVE

## 2017-03-30 LAB — STREP GP B NAA: STREP GROUP B AG: NEGATIVE

## 2017-04-02 ENCOUNTER — Inpatient Hospital Stay (HOSPITAL_COMMUNITY)
Admission: AD | Admit: 2017-04-02 | Discharge: 2017-04-02 | Disposition: A | Discharge status: Home / Self Care | Payer: Medicaid Other | Source: Ambulatory Visit | Attending: Family Medicine | Admitting: Family Medicine

## 2017-04-02 ENCOUNTER — Encounter (HOSPITAL_COMMUNITY): Payer: Self-pay | Admitting: *Deleted

## 2017-04-02 DIAGNOSIS — O09299 Supervision of pregnancy with other poor reproductive or obstetric history, unspecified trimester: Secondary | ICD-10-CM

## 2017-04-02 DIAGNOSIS — R112 Nausea with vomiting, unspecified: Secondary | ICD-10-CM

## 2017-04-02 DIAGNOSIS — O26893 Other specified pregnancy related conditions, third trimester: Secondary | ICD-10-CM

## 2017-04-02 DIAGNOSIS — R197 Diarrhea, unspecified: Secondary | ICD-10-CM

## 2017-04-02 DIAGNOSIS — O4703 False labor before 37 completed weeks of gestation, third trimester: Secondary | ICD-10-CM | POA: Diagnosis not present

## 2017-04-02 DIAGNOSIS — O479 False labor, unspecified: Secondary | ICD-10-CM

## 2017-04-02 DIAGNOSIS — R1013 Epigastric pain: Secondary | ICD-10-CM | POA: Insufficient documentation

## 2017-04-02 DIAGNOSIS — R51 Headache: Secondary | ICD-10-CM | POA: Diagnosis present

## 2017-04-02 DIAGNOSIS — Z3A36 36 weeks gestation of pregnancy: Secondary | ICD-10-CM | POA: Diagnosis not present

## 2017-04-02 DIAGNOSIS — Z3480 Encounter for supervision of other normal pregnancy, unspecified trimester: Secondary | ICD-10-CM

## 2017-04-02 DIAGNOSIS — O09293 Supervision of pregnancy with other poor reproductive or obstetric history, third trimester: Secondary | ICD-10-CM

## 2017-04-02 DIAGNOSIS — O26899 Other specified pregnancy related conditions, unspecified trimester: Secondary | ICD-10-CM

## 2017-04-02 DIAGNOSIS — O9A213 Injury, poisoning and certain other consequences of external causes complicating pregnancy, third trimester: Secondary | ICD-10-CM

## 2017-04-02 DIAGNOSIS — O1493 Unspecified pre-eclampsia, third trimester: Secondary | ICD-10-CM

## 2017-04-02 DIAGNOSIS — R109 Unspecified abdominal pain: Secondary | ICD-10-CM | POA: Diagnosis present

## 2017-04-02 LAB — AMYLASE: AMYLASE: 92 U/L (ref 28–100)

## 2017-04-02 LAB — CBC WITH DIFFERENTIAL/PLATELET
BASOS ABS: 0 10*3/uL (ref 0.0–0.1)
Basophils Relative: 0 %
Eosinophils Absolute: 0 10*3/uL (ref 0.0–0.7)
Eosinophils Relative: 0 %
HEMATOCRIT: 34.7 % — AB (ref 36.0–46.0)
HEMOGLOBIN: 11.8 g/dL — AB (ref 12.0–15.0)
LYMPHS ABS: 2.1 10*3/uL (ref 0.7–4.0)
LYMPHS PCT: 23 %
MCH: 29.2 pg (ref 26.0–34.0)
MCHC: 34 g/dL (ref 30.0–36.0)
MCV: 85.9 fL (ref 78.0–100.0)
Monocytes Absolute: 0.4 10*3/uL (ref 0.1–1.0)
Monocytes Relative: 4 %
NEUTROS ABS: 6.5 10*3/uL (ref 1.7–7.7)
NEUTROS PCT: 73 %
PLATELETS: 238 10*3/uL (ref 150–400)
RBC: 4.04 MIL/uL (ref 3.87–5.11)
RDW: 12.7 % (ref 11.5–15.5)
WBC: 9 10*3/uL (ref 4.0–10.5)

## 2017-04-02 LAB — COMPREHENSIVE METABOLIC PANEL
ALK PHOS: 173 U/L — AB (ref 38–126)
ALT: 14 U/L (ref 14–54)
AST: 19 U/L (ref 15–41)
Albumin: 2.6 g/dL — ABNORMAL LOW (ref 3.5–5.0)
Anion gap: 11 (ref 5–15)
BILIRUBIN TOTAL: 0.4 mg/dL (ref 0.3–1.2)
BUN: 15 mg/dL (ref 6–20)
CALCIUM: 9 mg/dL (ref 8.9–10.3)
CO2: 19 mmol/L — ABNORMAL LOW (ref 22–32)
CREATININE: 0.96 mg/dL (ref 0.44–1.00)
Chloride: 104 mmol/L (ref 101–111)
GFR calc Af Amer: >60 mL/min (ref >60)
Glucose, Bld: 128 mg/dL — ABNORMAL HIGH (ref 65–99)
Potassium: 3.8 mmol/L (ref 3.5–5.1)
Sodium: 134 mmol/L — ABNORMAL LOW (ref 135–145)
Total Protein: 6.9 g/dL (ref 6.5–8.1)

## 2017-04-02 LAB — PROTEIN / CREATININE RATIO, URINE
CREATININE, URINE: 134 mg/dL
Protein Creatinine Ratio: 0.66 mg/mg{Cre} — ABNORMAL HIGH (ref 0.00–0.15)
Total Protein, Urine: 89 mg/dL

## 2017-04-02 LAB — URINALYSIS, ROUTINE W REFLEX MICROSCOPIC
Bilirubin Urine: NEGATIVE
Glucose, UA: NEGATIVE mg/dL
Hgb urine dipstick: NEGATIVE
Ketones, ur: NEGATIVE mg/dL
Nitrite: NEGATIVE
PROTEIN: 100 mg/dL — AB
SPECIFIC GRAVITY, URINE: 1.016 (ref 1.005–1.030)
pH: 6 (ref 5.0–8.0)

## 2017-04-02 LAB — LIPASE, BLOOD: Lipase: 21 U/L (ref 11–51)

## 2017-04-02 NOTE — MAU Note (Signed)
Pt reports h/a's off and on for the past week, blurry vision off and on this past week, 2 bouts of vomiting today and a tender area at the top of her abdomen. Reports good fetal movement

## 2017-04-02 NOTE — MAU Note (Signed)
Pt states she's having abdominal pain after her daughter accidentally hit her in the stomach today. Also having a headache. No blurry vision. No bleeding or LOF. Having some vomiting today also, no other family members sick.

## 2017-04-02 NOTE — Discharge Instructions (Signed)
Braxton Hicks Contractions Contractions of the uterus can occur throughout pregnancy, but they are not always a sign that you are in labor. You may have practice contractions called Braxton Hicks contractions. These false labor contractions are sometimes confused with true labor. What are Braxton Hicks contractions? Braxton Hicks contractions are tightening movements that occur in the muscles of the uterus before labor. Unlike true labor contractions, these contractions do not result in opening (dilation) and thinning of the cervix. Toward the end of pregnancy (32-34 weeks), Braxton Hicks contractions can happen more often and may become stronger. These contractions are sometimes difficult to tell apart from true labor because they can be very uncomfortable. You should not feel embarrassed if you go to the hospital with false labor. Sometimes, the only way to tell if you are in true labor is for your health care provider to look for changes in the cervix. The health care provider will do a physical exam and may monitor your contractions. If you are not in true labor, the exam should show that your cervix is not dilating and your water has not broken. If there are other health problems associated with your pregnancy, it is completely safe for you to be sent home with false labor. You may continue to have Braxton Hicks contractions until you go into true labor. How to tell the difference between true labor and false labor True labor  Contractions last 30-70 seconds.  Contractions become very regular.  Discomfort is usually felt in the top of the uterus, and it spreads to the lower abdomen and low back.  Contractions do not go away with walking.  Contractions usually become more intense and increase in frequency.  The cervix dilates and gets thinner. False labor  Contractions are usually shorter and not as strong as true labor contractions.  Contractions are usually irregular.  Contractions  are often felt in the front of the lower abdomen and in the groin.  Contractions may go away when you walk around or change positions while lying down.  Contractions get weaker and are shorter-lasting as time goes on.  The cervix usually does not dilate or become thin. Follow these instructions at home:  Take over-the-counter and prescription medicines only as told by your health care provider.  Keep up with your usual exercises and follow other instructions from your health care provider.  Eat and drink lightly if you think you are going into labor.  If Braxton Hicks contractions are making you uncomfortable: ? Change your position from lying down or resting to walking, or change from walking to resting. ? Sit and rest in a tub of warm water. ? Drink enough fluid to keep your urine pale yellow. Dehydration may cause these contractions. ? Do slow and deep breathing several times an hour.  Keep all follow-up prenatal visits as told by your health care provider. This is important. Contact a health care provider if:  You have a fever.  You have continuous pain in your abdomen. Get help right away if:  Your contractions become stronger, more regular, and closer together.  You have fluid leaking or gushing from your vagina.  You pass blood-tinged mucus (bloody show).  You have bleeding from your vagina.  You have low back pain that you never had before.  You feel your baby's head pushing down and causing pelvic pressure.  Your baby is not moving inside you as much as it used to. Summary  Contractions that occur before labor are called Braxton   Hicks contractions, false labor, or practice contractions.  Braxton Hicks contractions are usually shorter, weaker, farther apart, and less regular than true labor contractions. True labor contractions usually become progressively stronger and regular and they become more frequent.  Manage discomfort from Braxton Hicks contractions by  changing position, resting in a warm bath, drinking plenty of water, or practicing deep breathing. This information is not intended to replace advice given to you by your health care provider. Make sure you discuss any questions you have with your health care provider. Document Released: 08/09/2016 Document Revised: 08/09/2016 Document Reviewed: 08/09/2016 Elsevier Interactive Patient Education  2018 Else[employer]     Preeclampsia and Eclampsia Preeclampsia is a serious condition that develops only during pregnancy. It is also called toxemia of pregnancy. This condition causes high blood pressure along with other symptoms, such as swelling and headaches. These symptoms may develop as the condition gets worse. Preeclampsia may occur at 20 weeks of pregnancy or later. Diagnosing and treating preeclampsia early is very important. If not treated early, it can cause serious problems for you and your baby. One problem it can lead to is eclampsia, which is a condition that causes muscle jerking or shaking (convulsions or seizures) in the mother. Delivering your baby is the best treatment for preeclampsia or eclampsia. Preeclampsia and eclampsia symptoms usually go away after your baby is born. What are the causes? The cause of preeclampsia is not known. What increases the risk? The following risk factors make you more likely to develop preeclampsia:  Being pregnant for the first time.  Having had preeclampsia during a past pregnancy.  Having a family history of preeclampsia.  Having high blood pressure.  Being pregnant with twins or triplets.  Being 35 or older.  Being African-American.  Having kidney disease or diabetes.  Having medical conditions such as lupus or blood diseases.  Being very overweight (obese).  What are the signs or symptoms? The earliest signs of preeclampsia are:  High blood pressure.  Increased protein in your urine. Your health care provider will check for  this at every visit before you give birth (prenatal visit).  Other symptoms that may develop as the condition gets worse include:  Severe headaches.  Sudden weight gain.  Swelling of the hands, face, legs, and feet.  Nausea and vomiting.  Vision problems, such as blurred or double vision.  Numbness in the face, arms, legs, and feet.  Urinating less than usual.  Dizziness.  Slurred speech.  Abdominal pain, especially upper abdominal pain.  Convulsions or seizures.  Symptoms generally go away after giving birth. How is this diagnosed? There are no screening tests for preeclampsia. Your health care provider will ask you about symptoms and check for signs of preeclampsia during your prenatal visits. You may also have tests that include:  Urine tests.  Blood tests.  Checking your blood pressure.  Monitoring your baby's heart rate.  Ultrasound.  How is this treated? You and your health care provider will determine the treatment approach that is best for you. Treatment may include:  Having more frequent prenatal exams to check for signs of preeclampsia, if you have an increased risk for preeclampsia.  Bed rest.  Reducing how much salt (sodium) you eat.  Medicine to lower your blood pressure.  Staying in the hospital, if your condition is severe. There, treatment will focus on controlling your blood pressure and the amount of fluids in your body (fluid retention).  You may need to take medicine (magnesium sulfate)   to prevent seizures. This medicine may be given as an injection or through an IV tube.  Delivering your baby early, if your condition gets worse. You may have your labor started with medicine (induced), or you may have a cesarean delivery.  Follow these instructions at home: Eating and drinking   Drink enough fluid to keep your urine clear or pale yellow.  Eat a healthy diet that is low in sodium. Do not add salt to your food. Check nutrition labels to  see how much sodium a food or beverage contains.  Avoid caffeine. Lifestyle  Do not use any products that contain nicotine or tobacco, such as cigarettes and e-cigarettes. If you need help quitting, ask your health care provider.  Do not use alcohol or drugs.  Avoid stress as much as possible. Rest and get plenty of sleep. General instructions  Take over-the-counter and prescription medicines only as told by your health care provider.  When lying down, lie on your side. This keeps pressure off of your baby.  When sitting or lying down, raise (elevate) your feet. Try putting some pillows underneath your lower legs.  Exercise regularly. Ask your health care provider what kinds of exercise are best for you.  Keep all follow-up and prenatal visits as told by your health care provider. This is important. How is this prevented? To prevent preeclampsia or eclampsia from developing during another pregnancy:  Get proper medical care during pregnancy. Your health care provider may be able to prevent preeclampsia or diagnose and treat it early.  Your health care provider may have you take a low-dose aspirin or a calcium supplement during your next pregnancy.  You may have tests of your blood pressure and kidney function after giving birth.  Maintain a healthy weight. Ask your health care provider for help managing weight gain during pregnancy.  Work with your health care provider to manage any long-term (chronic) health conditions you have, such as diabetes or kidney problems.  Contact a health care provider if:  You gain more weight than expected.  You have headaches.  You have nausea or vomiting.  You have abdominal pain.  You feel dizzy or light-headed. Get help right away if:  You develop sudden or severe swelling anywhere in your body. This usually happens in the legs.  You gain 5 lbs (2.3 kg) or more during one week.  You have severe: ? Abdominal  pain. ? Headaches. ? Dizziness. ? Vision problems. ? Confusion. ? Nausea or vomiting.  You have a seizure.  You have trouble moving any part of your body.  You develop numbness in any part of your body.  You have trouble speaking.  You have any abnormal bleeding.  You pass out. This information is not intended to replace advice given to you by your health care provider. Make sure you discuss any questions you have with your health care provider. Document Released: 03/23/2000 Document Revised: 11/22/2015 Document Reviewed: 10/31/2015 Elsevier Interactive Patient Education  2018 Elsevier Inc.  

## 2017-04-02 NOTE — MAU Provider Note (Signed)
Chief Complaint  Patient presents with  . Abdominal Pain  . Headache     First Provider Initiated Contact with Patient 04/02/17 2029      S: [name]  is a 26 y.o. y.o. year old [id number] female at 5985w4d weeks gestation who presents to MAU with HA, vision changes x a few weeks and epigastric pain and N/V x 2 today. States her young daughter accidentally hit her in the upper abd while they were playing around 1:00 pm and that is where the pain is. Pos Hx pre-E w/ twins in 2012 requiring PTD. Had C/S. No elevated BP's this pregnancy.   HA's are generalized throbbing, resolve w Tylenol.   Associated symptoms: Neg for fever, chills, sick contacts, VB, LOF.  Contractions: Irreg, moderate Vaginal bleeding: Denies Fetal movement: Nml  Obstetric History   G5   P1   T0   P1   A3   L2    SAB2   TAB1   Ectopic0   Multiple1   Live Births2     # Outcome Date GA Lbr Len/2nd Weight Sex Delivery Anes PTL Lv  5 Current           4 SAB 07/2013 8730w0d         3 TAB 03/13/12 58108w0d         2 SAB 01/08/12 2561w0d         1A Preterm 05/17/10 5351w0d   F CS-LTranv Spinal Y LIV     Complications: Preeclampsia  1B Preterm 05/17/10 2151w0d   M  Spinal Y LIV     Complications: Preeclampsia    Obstetric Comments  D&C     O:  Patient Vitals for the past 24 hrs:  BP Temp Pulse Resp SpO2  04/02/17 2229 112/76 - - - -  04/02/17 2205 125/76 - - - -  04/02/17 2145 116/71 - 92 - -  04/02/17 2130 119/74 - 81 - -  04/02/17 2020 133/78 - 97 16 -  04/02/17 1946 - 98.3 F (36.8 C) 87 16 97 %   General: NAD Heart: Regular rate Lungs: Normal rate and effort Abd: Soft, mild epigastric tenderness, no low abd tenderness, Gravid, S=D Extremities: Tr Pedal edema Neuro: 2+ deep tendon reflexes, No clonus Pelvic: NEFG, no bleeding or LOF.   Dilation: Closed Effacement (%): Thick Cervical Position: Posterior Exam by:: Ivonne AndrewV. Taeko Schaffer  CNM  EFM: 145, Moderate variability, 15 x 15 accelerations, no decelerations Toco:  irreg, mild  Results for orders placed or performed during the hospital encounter of 04/02/17 (from the past 24 hour(s))  Urinalysis, Routine w reflex microscopic     Status: Abnormal   Collection Time: 04/02/17  7:49 PM  Result Value Ref Range   Color, Urine YELLOW YELLOW   APPearance HAZY (A) CLEAR   Specific Gravity, Urine 1.016 1.005 - 1.030   pH 6.0 5.0 - 8.0   Glucose, UA NEGATIVE NEGATIVE mg/dL   Hgb urine dipstick NEGATIVE NEGATIVE   Bilirubin Urine NEGATIVE NEGATIVE   Ketones, ur NEGATIVE NEGATIVE mg/dL   Protein, ur 098100 (A) NEGATIVE mg/dL   Nitrite NEGATIVE NEGATIVE   Leukocytes, UA SMALL (A) NEGATIVE   RBC / HPF 0-5 0 - 5 RBC/hpf   WBC, UA 0-5 0 - 5 WBC/hpf   Bacteria, UA RARE (A) NONE SEEN   Squamous Epithelial / LPF 6-30 (A) NONE SEEN   Mucus PRESENT   Protein / creatinine ratio, urine     Status: Abnormal  Collection Time: 04/02/17  7:49 PM  Result Value Ref Range   Creatinine, Urine 134.00 mg/dL   Total Protein, Urine 89 mg/dL   Protein Creatinine Ratio 0.66 (H) 0.00 - 0.15 mg/mg[Cre]  CBC with Differential/Platelet     Status: Abnormal   Collection Time: 04/02/17  8:37 PM  Result Value Ref Range   WBC 9.0 4.0 - 10.5 K/uL   RBC 4.04 3.87 - 5.11 MIL/uL   Hemoglobin 11.8 (L) 12.0 - 15.0 g/dL   HCT 16.134.7 (L) 09.636.0 - 04.546.0 %   MCV 85.9 78.0 - 100.0 fL   MCH 29.2 26.0 - 34.0 pg   MCHC 34.0 30.0 - 36.0 g/dL   RDW 40.912.7 81.111.5 - 91.415.5 %   Platelets 238 150 - 400 K/uL   Neutrophils Relative % 73 %   Neutro Abs 6.5 1.7 - 7.7 K/uL   Lymphocytes Relative 23 %   Lymphs Abs 2.1 0.7 - 4.0 K/uL   Monocytes Relative 4 %   Monocytes Absolute 0.4 0.1 - 1.0 K/uL   Eosinophils Relative 0 %   Eosinophils Absolute 0.0 0.0 - 0.7 K/uL   Basophils Relative 0 %   Basophils Absolute 0.0 0.0 - 0.1 K/uL  Comprehensive metabolic panel     Status: Abnormal   Collection Time: 04/02/17  8:37 PM  Result Value Ref Range   Sodium 134 (L) 135 - 145 mmol/L   Potassium 3.8 3.5 - 5.1 mmol/L    Chloride 104 101 - 111 mmol/L   CO2 19 (L) 22 - 32 mmol/L   Glucose, Bld 128 (H) 65 - 99 mg/dL   BUN 15 6 - 20 mg/dL   Creatinine, Ser 7.820.96 0.44 - 1.00 mg/dL   Calcium 9.0 8.9 - 95.610.3 mg/dL   Total Protein 6.9 6.5 - 8.1 g/dL   Albumin 2.6 (L) 3.5 - 5.0 g/dL   AST 19 15 - 41 U/L   ALT 14 14 - 54 U/L   Alkaline Phosphatase 173 (H) 38 - 126 U/L   Total Bilirubin 0.4 0.3 - 1.2 mg/dL   GFR calc non Af Amer >60 >60 mL/min   GFR calc Af Amer >60 >60 mL/min   Anion gap 11 5 - 15  Lipase, blood     Status: None   Collection Time: 04/02/17  8:37 PM  Result Value Ref Range   Lipase 21 11 - 51 U/L  Amylase     Status: None   Collection Time: 04/02/17  8:37 PM  Result Value Ref Range   Amylase 92 28 - 100 U/L    A:  - 6472w4d week IUP - Braxton Hicks w/out evidence of active labor - HA (that resolved w/ Tylenol before arrival), epigastric pain (now 4/10 w/out intervention) and elevated PCR, but Nml BP and other labs Nml->No Dx of Pre-E or HELLP - FHR reactive - Mild abd trauma w/out evidence of abruption and reactive FHR  P: Discharge home in stable condition  Preeclampsia and abruption precautions. Follow-up for blood pressure check in 1 day at your doctor's office sooner as needed if symptoms worsen. Return to maternity admissions as needed in emergencies Tylenol PRN for HA's  Katrinka BlazingSmith, [place], [place] 04/02/2017 10:42 PM

## 2017-04-03 ENCOUNTER — Ambulatory Visit (INDEPENDENT_AMBULATORY_CARE_PROVIDER_SITE_OTHER): Payer: Medicaid Other | Admitting: Obstetrics

## 2017-04-03 VITALS — BP 124/84 | HR 96 | Wt 253.0 lb

## 2017-04-03 DIAGNOSIS — O09299 Supervision of pregnancy with other poor reproductive or obstetric history, unspecified trimester: Secondary | ICD-10-CM

## 2017-04-03 DIAGNOSIS — O09293 Supervision of pregnancy with other poor reproductive or obstetric history, third trimester: Secondary | ICD-10-CM

## 2017-04-03 DIAGNOSIS — Z3480 Encounter for supervision of other normal pregnancy, unspecified trimester: Secondary | ICD-10-CM

## 2017-04-03 DIAGNOSIS — Z3483 Encounter for supervision of other normal pregnancy, third trimester: Secondary | ICD-10-CM

## 2017-04-03 DIAGNOSIS — Z98891 History of uterine scar from previous surgery: Secondary | ICD-10-CM

## 2017-04-04 ENCOUNTER — Other Ambulatory Visit: Payer: Self-pay

## 2017-04-04 ENCOUNTER — Encounter (HOSPITAL_COMMUNITY): Payer: Self-pay

## 2017-04-04 ENCOUNTER — Encounter (HOSPITAL_COMMUNITY): Payer: Self-pay | Admitting: *Deleted

## 2017-04-04 ENCOUNTER — Inpatient Hospital Stay (HOSPITAL_COMMUNITY)
Admission: AD | Admit: 2017-04-04 | Discharge: 2017-04-04 | Disposition: A | Discharge status: Home / Self Care | Payer: Medicaid Other | Source: Ambulatory Visit | Attending: Obstetrics & Gynecology | Admitting: Obstetrics & Gynecology

## 2017-04-04 DIAGNOSIS — O09213 Supervision of pregnancy with history of pre-term labor, third trimester: Secondary | ICD-10-CM | POA: Insufficient documentation

## 2017-04-04 DIAGNOSIS — O4703 False labor before 37 completed weeks of gestation, third trimester: Secondary | ICD-10-CM | POA: Insufficient documentation

## 2017-04-04 DIAGNOSIS — O26893 Other specified pregnancy related conditions, third trimester: Secondary | ICD-10-CM | POA: Insufficient documentation

## 2017-04-04 DIAGNOSIS — R109 Unspecified abdominal pain: Secondary | ICD-10-CM | POA: Diagnosis present

## 2017-04-04 DIAGNOSIS — O09299 Supervision of pregnancy with other poor reproductive or obstetric history, unspecified trimester: Secondary | ICD-10-CM

## 2017-04-04 DIAGNOSIS — Z3A36 36 weeks gestation of pregnancy: Secondary | ICD-10-CM | POA: Diagnosis not present

## 2017-04-04 DIAGNOSIS — O09293 Supervision of pregnancy with other poor reproductive or obstetric history, third trimester: Secondary | ICD-10-CM

## 2017-04-04 DIAGNOSIS — B9689 Other specified bacterial agents as the cause of diseases classified elsewhere: Secondary | ICD-10-CM | POA: Diagnosis not present

## 2017-04-04 DIAGNOSIS — O1493 Unspecified pre-eclampsia, third trimester: Secondary | ICD-10-CM | POA: Diagnosis not present

## 2017-04-04 DIAGNOSIS — M549 Dorsalgia, unspecified: Secondary | ICD-10-CM | POA: Insufficient documentation

## 2017-04-04 DIAGNOSIS — Z87891 Personal history of nicotine dependence: Secondary | ICD-10-CM | POA: Insufficient documentation

## 2017-04-04 DIAGNOSIS — O479 False labor, unspecified: Secondary | ICD-10-CM | POA: Diagnosis present

## 2017-04-04 DIAGNOSIS — O23593 Infection of other part of genital tract in pregnancy, third trimester: Secondary | ICD-10-CM | POA: Insufficient documentation

## 2017-04-04 DIAGNOSIS — O133 Gestational [pregnancy-induced] hypertension without significant proteinuria, third trimester: Secondary | ICD-10-CM | POA: Insufficient documentation

## 2017-04-04 LAB — COMPREHENSIVE METABOLIC PANEL
ALBUMIN: 2.7 g/dL — AB (ref 3.5–5.0)
ALK PHOS: 179 U/L — AB (ref 38–126)
ALT: 14 U/L (ref 14–54)
ANION GAP: 13 (ref 5–15)
AST: 18 U/L (ref 15–41)
BILIRUBIN TOTAL: 0.4 mg/dL (ref 0.3–1.2)
BUN: 8 mg/dL (ref 6–20)
CALCIUM: 9.1 mg/dL (ref 8.9–10.3)
CO2: 21 mmol/L — ABNORMAL LOW (ref 22–32)
Chloride: 101 mmol/L (ref 101–111)
Creatinine, Ser: 0.71 mg/dL (ref 0.44–1.00)
GFR calc Af Amer: >60 mL/min (ref >60)
GFR calc non Af Amer: >60 mL/min (ref >60)
GLUCOSE: 86 mg/dL (ref 65–99)
Potassium: 3.8 mmol/L (ref 3.5–5.1)
Sodium: 135 mmol/L (ref 135–145)
TOTAL PROTEIN: 7.2 g/dL (ref 6.5–8.1)

## 2017-04-04 LAB — CBC
HEMATOCRIT: 35.8 % — AB (ref 36.0–46.0)
HEMOGLOBIN: 12.2 g/dL (ref 12.0–15.0)
MCH: 29.5 pg (ref 26.0–34.0)
MCHC: 34.1 g/dL (ref 30.0–36.0)
MCV: 86.5 fL (ref 78.0–100.0)
Platelets: 226 10*3/uL (ref 150–400)
RBC: 4.14 MIL/uL (ref 3.87–5.11)
RDW: 13.2 % (ref 11.5–15.5)
WBC: 8.4 10*3/uL (ref 4.0–10.5)

## 2017-04-04 LAB — PROTEIN / CREATININE RATIO, URINE
Creatinine, Urine: 85 mg/dL
Protein Creatinine Ratio: 0.69 mg/mg{Cre} — ABNORMAL HIGH (ref 0.00–0.15)
Total Protein, Urine: 59 mg/dL

## 2017-04-04 MED ORDER — CYCLOBENZAPRINE HCL 10 MG PO TABS: 10.0000 mg | ORAL_TABLET | Freq: Two times a day (BID) | ORAL | 0 refills | Status: DC | PRN

## 2017-04-04 MED ORDER — ACETAMINOPHEN 500 MG PO TABS
1000.0000 mg | ORAL_TABLET | Freq: Once | ORAL | Status: AC
  Administered 2017-04-04: 1000 mg via ORAL
  Filled 2017-04-04: qty 2

## 2017-04-04 NOTE — Discharge Instructions (Signed)
Please return to Maternity Admissions Unit for contractions that are closer together and/or stronger.

## 2017-04-04 NOTE — MAU Note (Signed)
Discharge instructions reviewed with patient.  Pt. Verbalized understanding, e-signature down, pt. Signed hard copy, hard copy placed with medical records.

## 2017-04-04 NOTE — MAU Note (Signed)
Urine in lab 

## 2017-04-04 NOTE — MAU Note (Signed)
Pt reports they had called to do her preop for her scheduled c/s on 01/11 and she was contractioning so they told her to come in

## 2017-04-04 NOTE — MAU Provider Note (Signed)
History     CSN: [id number]  Arrival date and time: 04/04/17 1406   First Provider Initiated Contact with Patient 04/04/17 1711      Chief Complaint  Patient presents with  . Contractions   HPI  HPI: [name] is a 26 y.o. year old [id number] female at 3717w6d weeks gestation who presents to MAU reporting she has been contracting and the pre-op staff told her to come in for evaluation. She reports "the pain in her back is so intense that she can't take it."  Past Medical History:  Diagnosis Date  . Bacterial vaginosis 08/15/2016  . Headache(784.0)   . Hypertension   . Incompetent cervix   . Pregnancy induced hypertension   . Preterm labor   . Urinary tract infection     Past Surgical History:  Procedure Laterality Date  . BREAST FIBROADENOMA SURGERY  2006-7.   Removal;right  . CESAREAN SECTION    . DILATION AND CURETTAGE OF UTERUS    . THERAPEUTIC ABORTION      Family History  Problem Relation Age of Onset  . Diabetes Mother   . Diabetes Maternal Grandmother   . Hypertension Maternal Grandmother   . Cancer Maternal Grandmother        colon cancer, brain tumour  . Other Neg Hx     Social History   Tobacco Use  . Smoking status: Former Smoker    Packs/day: 0.25    Types: Cigarettes    Last attempt to quit: 08/17/2016    Years since quitting: 0.6  . Smokeless tobacco: Former Engineer, waterUser  Substance Use Topics  . Alcohol use: No    Alcohol/week: 0.0 oz  . Drug use: No    Comment: last use begin of preg    Allergies: No Known Allergies  Medications Prior to Admission  Medication Sig Dispense Refill Last Dose  . acetaminophen (TYLENOL) 500 MG tablet Take 1,000 mg by mouth every 6 (six) hours as needed for mild pain or headache.    04/03/2017 at Unknown time  . ondansetron (ZOFRAN ODT) 4 MG disintegrating tablet Take 1 tablet (4 mg total) every 8 (eight) hours as needed by mouth for nausea or vomiting. (Patient not taking: Reported on 03/23/2017) 20 tablet 0 More  than a month at Unknown time    Review of Systems Physical Exam   Blood pressure (!) 121/95, pulse 100, temperature 98.3 F (36.8 C), temperature source Oral, resp. rate 18, height 5\' 4"  (1.626 m), weight 253 lb (114.8 kg), last menstrual period 07/11/2016, SpO2 99 %, unknown if currently breastfeeding.  Physical Exam  MAU Course  Procedures  MDM CCUA CBC CMP P/C Ratio Serial BPs NST - FHR: 145 bpm / moderate variability / accels present / decels absent / TOCO: irregular UI noted  *Consult with Dr. Adrian BlackwaterStinson @ 1945 - notified of patient's complaints, assessments, lab results, & NST tx plan d/c home, have pt go tto [facility] for BP recheck tomorrow  Results for orders placed or performed during the hospital encounter of 04/04/17 (from the past 24 hour(s))  Protein / creatinine ratio, urine     Status: Abnormal   Collection Time: 04/04/17  2:25 PM  Result Value Ref Range   Creatinine, Urine 85.00 mg/dL   Total Protein, Urine 59 mg/dL   Protein Creatinine Ratio 0.69 (H) 0.00 - 0.15 mg/mg[Cre]  CBC     Status: Abnormal   Collection Time: 04/04/17  5:23 PM  Result Value Ref Range   WBC 8.4  4.0 - 10.5 K/uL   RBC 4.14 3.87 - 5.11 MIL/uL   Hemoglobin 12.2 12.0 - 15.0 g/dL   HCT 16.135.8 (L) 09.636.0 - 04.546.0 %   MCV 86.5 78.0 - 100.0 fL   MCH 29.5 26.0 - 34.0 pg   MCHC 34.1 30.0 - 36.0 g/dL   RDW 40.913.2 81.111.5 - 91.415.5 %   Platelets 226 150 - 400 K/uL  Comprehensive metabolic panel     Status: Abnormal   Collection Time: 04/04/17  5:23 PM  Result Value Ref Range   Sodium 135 135 - 145 mmol/L   Potassium 3.8 3.5 - 5.1 mmol/L   Chloride 101 101 - 111 mmol/L   CO2 21 (L) 22 - 32 mmol/L   Glucose, Bld 86 65 - 99 mg/dL   BUN 8 6 - 20 mg/dL   Creatinine, Ser 7.820.71 0.44 - 1.00 mg/dL   Calcium 9.1 8.9 - 95.610.3 mg/dL   Total Protein 7.2 6.5 - 8.1 g/dL   Albumin 2.7 (L) 3.5 - 5.0 g/dL   AST 18 15 - 41 U/L   ALT 14 14 - 54 U/L   Alkaline Phosphatase 179 (H) 38 - 126 U/L   Total Bilirubin 0.4 0.3 -  1.2 mg/dL   GFR calc non Af Amer >60 >60 mL/min   GFR calc Af Amer >60 >60 mL/min   Anion gap 13 5 - 15     Assessment and Plan  Preeclampsia, third trimester [facility] -- msg sent to admin pool - Information provided on PEC   Braxton-Hicks Contractions - Advised to return for increased ctxs every 3-5 mins and stronger  Back Pain in Pregnancy - Rx for Flexeril 1 BID prn back pain  - Discharge home - [facility] - Patient verbalized an understanding of the plan of care and agrees.   Breanna Moraolitta Aziah Brostrom, MSN, CNM 04/04/2017, 5:30 PM

## 2017-04-04 NOTE — Progress Notes (Signed)
Dr. Langston MaskerMorris notified of labor evaluation and increased BP - MAU provider to see patient.

## 2017-04-04 NOTE — Pre-Procedure Instructions (Signed)
Pt having ucs q 2-3 min during conversation states contractions started last night and have gotten closer together and stronger throughout the day.  No leaking of fluid or bleeding.  Has fetal movement.  Instructed pt to come to MAU for RO Labor eval.  Notified Dr Adrian BlackwaterStinson of conversation and [facility]  [facility]U notified as well.

## 2017-04-05 ENCOUNTER — Encounter: Payer: Self-pay | Admitting: Obstetrics

## 2017-04-05 ENCOUNTER — Ambulatory Visit (INDEPENDENT_AMBULATORY_CARE_PROVIDER_SITE_OTHER): Payer: Medicaid Other | Admitting: Obstetrics & Gynecology

## 2017-04-05 ENCOUNTER — Ambulatory Visit (INDEPENDENT_AMBULATORY_CARE_PROVIDER_SITE_OTHER): Payer: Medicaid Other

## 2017-04-05 VITALS — BP 117/83 | HR 125 | Ht 64.0 in | Wt 253.9 lb

## 2017-04-05 VITALS — BP 117/83 | HR 125 | Wt 253.9 lb

## 2017-04-05 DIAGNOSIS — O09299 Supervision of pregnancy with other poor reproductive or obstetric history, unspecified trimester: Secondary | ICD-10-CM

## 2017-04-05 DIAGNOSIS — Z3480 Encounter for supervision of other normal pregnancy, unspecified trimester: Secondary | ICD-10-CM

## 2017-04-05 DIAGNOSIS — Z8759 Personal history of other complications of pregnancy, childbirth and the puerperium: Secondary | ICD-10-CM

## 2017-04-05 NOTE — Progress Notes (Signed)
Was seen in MAU 12/27 and had one borderline BP. Notes headache not relieved by Tylenol, says she thinks ibuprofen 400 mg would help   PRENATAL VISIT NOTE  Subjective:  [name] is a 26 y.o. [phone] at 5672w0d being seen today for ongoing prenatal care.  She is currently monitored for the following issues for this high-risk pregnancy and has OBESITY; TOBACCO USE, QUIT; Supervision of other normal pregnancy, antepartum; History of C-section; History of pregnancy induced hypertension; Nausea vomiting and diarrhea; Urinary tract infection in mother during third trimester of pregnancy; Unwanted fertility; H/O pre-eclampsia in prior pregnancy, currently pregnant; Preeclampsia, third trimester; and Braxton Hick's contraction on their problem list.  Patient reports fatigue, headache and pressure low abdomen.  Contractions: Irritability. Vag. Bleeding: None.  Movement: Present. Denies leaking of fluid.   The following portions of the patient's history were reviewed and updated as appropriate: allergies, current medications, past family history, past medical history, past social history, past surgical history and problem list. Problem list updated.  Objective:   Vitals:   04/05/17 1443  BP: 117/83  Pulse: (!) 125  Weight: 253 lb 14.4 oz (115.2 kg)    Fetal Status: Fetal Heart Rate (bpm): 135   Movement: Present     General:  Alert, oriented and cooperative. Patient is in no acute distress.  Skin: Skin is warm and dry. No rash noted.   Cardiovascular: Normal heart rate noted  Respiratory: Normal respiratory effort, no problems with respiration noted  Abdomen: Soft, gravid, appropriate for gestational age.  Pain/Pressure: Present     Pelvic: Cervical exam deferred        Extremities: Normal range of motion.  Edema: Trace  Mental Status:  Normal mood and affect. Normal behavior. Normal judgment and thought content.   Assessment and Plan:  Pregnancy: [id number] at 7072w0d  1. H/O pre-eclampsia in  prior pregnancy, currently pregnant BP normal, Pr Cr stable  2. Supervision of other normal pregnancy, antepartum Suspect TT headache, may have one dose of ibuprofen 400 mg now, report if not improved  Term labor symptoms and general obstetric precautions including but not limited to vaginal bleeding, contractions, leaking of fluid and fetal movement were reviewed in detail with the patient. Please refer to After Visit Summary for other counseling recommendations.  Return in about 5 days (around 04/10/2017).   Scheryl DarterJames Arnold, MD

## 2017-04-05 NOTE — Progress Notes (Signed)
Subjective:  [name] is a 26 y.o. female with hypertension.  FHR 135 Current Outpatient Medications  Medication Sig Dispense Refill  . acetaminophen (TYLENOL) 500 MG tablet Take 1,000 mg by mouth every 6 (six) hours as needed for mild pain or headache.     . cyclobenzaprine (FLEXERIL) 10 MG tablet Take 1 tablet (10 mg total) by mouth 2 (two) times daily as needed for muscle spasms. 20 tablet 0  . ondansetron (ZOFRAN ODT) 4 MG disintegrating tablet Take 1 tablet (4 mg total) every 8 (eight) hours as needed by mouth for nausea or vomiting. (Patient not taking: Reported on 03/23/2017) 20 tablet 0   No current facility-administered medications for this visit.     Hypertension ROS: Headaches 10/10 x 7 days,blurry vision, dizziness, nausea, shortness of breath, cold sweats, clammy skin, swollen feet.  No chest pains. New concerns: .   Objective:  BP 117/83   Pulse (!) 125   Ht 5\' 4"  (1.626 m)   Wt 253 lb 14.4 oz (115.2 kg)   LMP 07/11/2016 (Exact Date)   Breastfeeding? No   BMI 43.58 kg/m   Appearance alert, well appearing, and in great distress and overweight. General exam BP noted to be well controlled today in office.    Assessment:   Hypertension stable.   Plan:  Current treatment plan is effective, no change in therapy..Marland Kitchen

## 2017-04-05 NOTE — Progress Notes (Signed)
Complains of headache 10/10 x 7 days, blurry vision, dizziness, nausea, shortness of breath, cold sweats, clammy skin and swollen feet. Denies chest pains.

## 2017-04-05 NOTE — Patient Instructions (Signed)

## 2017-04-05 NOTE — Progress Notes (Signed)
Subjective:  Breanna EmeryMary E Moreno is a 26 y.o. [phone] at 126w0d being seen today for ongoing prenatal care.  She is currently monitored for the following issues for this low-risk pregnancy and has OBESITY; TOBACCO USE, QUIT; Supervision of other normal pregnancy, antepartum; History of C-section; History of pregnancy induced hypertension; Nausea vomiting and diarrhea; Urinary tract infection in mother during third trimester of pregnancy; Unwanted fertility; H/O pre-eclampsia in prior pregnancy, currently pregnant; Preeclampsia, third trimester; and Braxton Hick's contraction on their problem list.  Patient reports no complaints.  Contractions: Irregular. Vag. Bleeding: None.  Movement: Present. Denies leaking of fluid.   The following portions of the patient's history were reviewed and updated as appropriate: allergies, current medications, past family history, past medical history, past social history, past surgical history and problem list. Problem list updated.  Objective:   Vitals:   04/03/17 1320  BP: 124/84  Pulse: 96  Weight: 253 lb (114.8 kg)    Fetal Status:     Movement: Present     General:  Alert, oriented and cooperative. Patient is in no acute distress.  Skin: Skin is warm and dry. No rash noted.   Cardiovascular: Normal heart rate noted  Respiratory: Normal respiratory effort, no problems with respiration noted  Abdomen: Soft, gravid, appropriate for gestational age. Pain/Pressure: Present     Pelvic:  Cervical exam deferred        Extremities: Normal range of motion.     Mental Status: Normal mood and affect. Normal behavior. Normal judgment and thought content.   Urinalysis:      Assessment and Plan:  Pregnancy: [id number] at 686w0d  1. Supervision of other normal pregnancy, antepartum  2. History of C-section   3. H/O pre-eclampsia in prior pregnancy, currently pregnant    Term labor symptoms and general obstetric precautions including but not limited to vaginal  bleeding, contractions, leaking of fluid and fetal movement were reviewed in detail with the patient. Please refer to After Visit Summary for other counseling recommendations.  Return in about 1 week (around 04/10/2017) for ROB.   Brock BadHarper, Detrich Rakestraw A, MD

## 2017-04-10 ENCOUNTER — Encounter: Payer: Self-pay | Admitting: Obstetrics

## 2017-04-10 ENCOUNTER — Ambulatory Visit (INDEPENDENT_AMBULATORY_CARE_PROVIDER_SITE_OTHER): Payer: Medicaid Other | Admitting: Obstetrics

## 2017-04-10 VITALS — BP 115/82 | HR 93 | Wt 250.8 lb

## 2017-04-10 DIAGNOSIS — Z98891 History of uterine scar from previous surgery: Secondary | ICD-10-CM

## 2017-04-10 DIAGNOSIS — O09299 Supervision of pregnancy with other poor reproductive or obstetric history, unspecified trimester: Secondary | ICD-10-CM

## 2017-04-10 DIAGNOSIS — Z3480 Encounter for supervision of other normal pregnancy, unspecified trimester: Secondary | ICD-10-CM

## 2017-04-10 DIAGNOSIS — O34219 Maternal care for unspecified type scar from previous cesarean delivery: Secondary | ICD-10-CM

## 2017-04-10 DIAGNOSIS — O09293 Supervision of pregnancy with other poor reproductive or obstetric history, third trimester: Secondary | ICD-10-CM

## 2017-04-10 DIAGNOSIS — Z302 Encounter for sterilization: Secondary | ICD-10-CM

## 2017-04-10 NOTE — Progress Notes (Signed)
Subjective:  [name] is a 27 y.o. [phone] at 6073w5d being seen today for ongoing prenatal care.  She is currently monitored for the following issues for this low-risk pregnancy and has OBESITY; TOBACCO USE, QUIT; Supervision of other normal pregnancy, antepartum; History of C-section; History of pregnancy induced hypertension; Nausea vomiting and diarrhea; Urinary tract infection in mother during third trimester of pregnancy; Unwanted fertility; H/O pre-eclampsia in prior pregnancy, currently pregnant; Preeclampsia, third trimester; and Braxton Hick's contraction on their problem list.  Patient reports no complaints.  Contractions: Irregular. Vag. Bleeding: None.  Movement: Present. Denies leaking of fluid.   The following portions of the patient's history were reviewed and updated as appropriate: allergies, current medications, past family history, past medical history, past social history, past surgical history and problem list. Problem list updated.  Objective:   Vitals:   04/10/17 1030  BP: 115/82  Pulse: 93  Weight: 250 lb 12.8 oz (113.8 kg)    Fetal Status: Fetal Heart Rate (bpm): 140   Movement: Present     General:  Alert, oriented and cooperative. Patient is in no acute distress.  Skin: Skin is warm and dry. No rash noted.   Cardiovascular: Normal heart rate noted  Respiratory: Normal respiratory effort, no problems with respiration noted  Abdomen: Soft, gravid, appropriate for gestational age. Pain/Pressure: Present     Pelvic:  Cervical exam deferred        Extremities: Normal range of motion.  Edema: Trace  Mental Status: Normal mood and affect. Normal behavior. Normal judgment and thought content.   Urinalysis:      Assessment and Plan:  Pregnancy: [id number] at 8973w5d 1. Supervision of other normal pregnancy, antepartum  2. H/O pre-eclampsia in prior pregnancy, currently pregnant  3. History of C-section - wants REPEAT C/S and BTL  4. Request for sterilization at  time of C/S  There are no diagnoses linked to this encounter. Preterm labor symptoms and general obstetric precautions including but not limited to vaginal bleeding, contractions, leaking of fluid and fetal movement were reviewed in detail with the patient. Please refer to After Visit Summary for other counseling recommendations.  Return in about 1 week (around 04/17/2017) for ROB.   Brock BadHarper, Wilmer Santillo A, MD

## 2017-04-12 NOTE — Pre-Procedure Instructions (Signed)
Left message returning a phone call on 04/12/2017 at 1046

## 2017-04-15 ENCOUNTER — Other Ambulatory Visit: Payer: Self-pay | Admitting: Certified Nurse Midwife

## 2017-04-15 DIAGNOSIS — Z3480 Encounter for supervision of other normal pregnancy, unspecified trimester: Secondary | ICD-10-CM

## 2017-04-15 DIAGNOSIS — B3731 Acute candidiasis of vulva and vagina: Secondary | ICD-10-CM

## 2017-04-15 DIAGNOSIS — N760 Acute vaginitis: Principal | ICD-10-CM

## 2017-04-15 DIAGNOSIS — B373 Candidiasis of vulva and vagina: Secondary | ICD-10-CM

## 2017-04-15 DIAGNOSIS — B9689 Other specified bacterial agents as the cause of diseases classified elsewhere: Secondary | ICD-10-CM

## 2017-04-15 MED ORDER — SECNIDAZOLE 2 G PO PACK
1.0000 | PACK | Freq: Once | ORAL | 0 refills | Status: AC
Start: 2017-04-15 — End: 2017-04-15

## 2017-04-15 MED ORDER — TERCONAZOLE 0.8 % VA CREA: 1.0000 | TOPICAL_CREAM | Freq: Every day | VAGINAL | 0 refills | Status: DC

## 2017-04-15 MED ORDER — FLUCONAZOLE 150 MG PO TABS: 150.0000 mg | ORAL_TABLET | Freq: Once | ORAL | 0 refills | Status: AC

## 2017-04-17 ENCOUNTER — Ambulatory Visit (INDEPENDENT_AMBULATORY_CARE_PROVIDER_SITE_OTHER): Payer: Medicaid Other | Admitting: Certified Nurse Midwife

## 2017-04-17 VITALS — BP 130/84 | HR 99 | Wt 253.6 lb

## 2017-04-17 DIAGNOSIS — O09293 Supervision of pregnancy with other poor reproductive or obstetric history, third trimester: Secondary | ICD-10-CM

## 2017-04-17 DIAGNOSIS — Z3483 Encounter for supervision of other normal pregnancy, third trimester: Secondary | ICD-10-CM

## 2017-04-17 DIAGNOSIS — Z3480 Encounter for supervision of other normal pregnancy, unspecified trimester: Secondary | ICD-10-CM

## 2017-04-17 DIAGNOSIS — O09299 Supervision of pregnancy with other poor reproductive or obstetric history, unspecified trimester: Secondary | ICD-10-CM

## 2017-04-17 NOTE — Progress Notes (Signed)
pihPatient reports good fetal movement, reports pelvic pressure and irregular contractions.

## 2017-04-17 NOTE — Patient Instructions (Signed)
Tommi EmeryMary E Skarda  04/17/2017   Your procedure is scheduled on:  04/19/2017  [facility] at 0730 AM.  Pick up the phone at the desk and dial [phone]  Call this number if you have problems the morning of surgery:[phone]  Remember:   Do not eat food:After Midnight.  Do not drink clear liquids: After Midnight.  Take these medicines the morning of surgery with A SIP OF WATER: none   Do not wear jewelry, make-up or nail polish.  Do not wear lotions, powders, or perfumes. Do not wear deodorant.  Do not shave 48 hours prior to surgery.  Do not bring valuables to the hospital.  [facility] is not   responsible for any belongings or valuables brought to the hospital.  Contacts, dentures or bridgework may not be worn into surgery.  Leave suitcase in the car. After surgery it may be brought to your room.  For patients admitted to the hospital, checkout time is 11:00 AM the day of              discharge.    N/A   Please read over the following fact sheets that you were given:   Surgical Site Infection Prevention

## 2017-04-18 ENCOUNTER — Other Ambulatory Visit: Payer: Self-pay | Admitting: Obstetrics & Gynecology

## 2017-04-18 ENCOUNTER — Encounter (HOSPITAL_COMMUNITY)
Admission: RE | Admit: 2017-04-18 | Discharge: 2017-04-18 | Disposition: A | Discharge status: Home / Self Care | Payer: Medicaid Other | Source: Ambulatory Visit | Attending: Obstetrics & Gynecology | Admitting: Obstetrics & Gynecology

## 2017-04-18 LAB — CBC
HEMATOCRIT: 35.9 % — AB (ref 36.0–46.0)
HEMOGLOBIN: 12.2 g/dL (ref 12.0–15.0)
MCH: 29.1 pg (ref 26.0–34.0)
MCHC: 34 g/dL (ref 30.0–36.0)
MCV: 85.7 fL (ref 78.0–100.0)
Platelets: 246 10*3/uL (ref 150–400)
RBC: 4.19 MIL/uL (ref 3.87–5.11)
RDW: 13.1 % (ref 11.5–15.5)
WBC: 8.6 10*3/uL (ref 4.0–10.5)

## 2017-04-18 LAB — TYPE AND SCREEN
ABO/RH(D): O POS
ANTIBODY SCREEN: NEGATIVE

## 2017-04-19 ENCOUNTER — Inpatient Hospital Stay (HOSPITAL_COMMUNITY)
Admission: RE | Admit: 2017-04-19 | Discharge: 2017-04-21 | DRG: 785 | Disposition: A | Discharge status: Home / Self Care | Payer: Medicaid Other | Source: Ambulatory Visit | Attending: Obstetrics & Gynecology | Admitting: Obstetrics & Gynecology

## 2017-04-19 ENCOUNTER — Encounter (HOSPITAL_COMMUNITY)
Admission: RE | Disposition: A | Discharge status: Home / Self Care | Payer: Self-pay | Source: Ambulatory Visit | Attending: Obstetrics & Gynecology

## 2017-04-19 ENCOUNTER — Encounter (HOSPITAL_COMMUNITY): Payer: Self-pay | Admitting: Anesthesiology

## 2017-04-19 ENCOUNTER — Inpatient Hospital Stay (HOSPITAL_COMMUNITY): Payer: Medicaid Other | Admitting: Anesthesiology

## 2017-04-19 ENCOUNTER — Other Ambulatory Visit: Payer: Self-pay

## 2017-04-19 DIAGNOSIS — Z87891 Personal history of nicotine dependence: Secondary | ICD-10-CM | POA: Diagnosis not present

## 2017-04-19 DIAGNOSIS — Z302 Encounter for sterilization: Secondary | ICD-10-CM

## 2017-04-19 DIAGNOSIS — O99214 Obesity complicating childbirth: Secondary | ICD-10-CM | POA: Diagnosis present

## 2017-04-19 DIAGNOSIS — Z3A39 39 weeks gestation of pregnancy: Secondary | ICD-10-CM

## 2017-04-19 DIAGNOSIS — O34211 Maternal care for low transverse scar from previous cesarean delivery: Secondary | ICD-10-CM | POA: Diagnosis present

## 2017-04-19 DIAGNOSIS — O134 Gestational [pregnancy-induced] hypertension without significant proteinuria, complicating childbirth: Secondary | ICD-10-CM | POA: Diagnosis present

## 2017-04-19 DIAGNOSIS — Z98891 History of uterine scar from previous surgery: Secondary | ICD-10-CM

## 2017-04-19 LAB — CBC
HCT: 37 % (ref 36.0–46.0)
Hemoglobin: 12.7 g/dL (ref 12.0–15.0)
MCH: 29.3 pg (ref 26.0–34.0)
MCHC: 34.3 g/dL (ref 30.0–36.0)
MCV: 85.3 fL (ref 78.0–100.0)
PLATELETS: 255 10*3/uL (ref 150–400)
RBC: 4.34 MIL/uL (ref 3.87–5.11)
RDW: 13.2 % (ref 11.5–15.5)
WBC: 8.7 10*3/uL (ref 4.0–10.5)

## 2017-04-19 LAB — RPR: RPR: NONREACTIVE

## 2017-04-19 LAB — CREATININE, SERUM: Creatinine, Ser: 0.84 mg/dL (ref 0.44–1.00)

## 2017-04-19 SURGERY — Surgical Case
Anesthesia: Spinal | Laterality: Bilateral

## 2017-04-19 MED ORDER — FENTANYL CITRATE (PF) 100 MCG/2ML IJ SOLN
25.0000 ug | INTRAMUSCULAR | Status: DC | PRN
  Administered 2017-04-19 (×3): 25 ug via INTRAVENOUS

## 2017-04-19 MED ORDER — AMLODIPINE BESYLATE 5 MG PO TABS
5.0000 mg | ORAL_TABLET | Freq: Every day | ORAL | Status: DC
  Administered 2017-04-19 – 2017-04-21 (×2): 5 mg via ORAL
  Filled 2017-04-19 (×3): qty 1

## 2017-04-19 MED ORDER — DEXAMETHASONE SODIUM PHOSPHATE 4 MG/ML IJ SOLN
INTRAMUSCULAR | Status: DC | PRN
  Administered 2017-04-19: 4 mg via INTRAVENOUS

## 2017-04-19 MED ORDER — OXYCODONE HCL 5 MG PO TABS: 5.0000 mg | ORAL_TABLET | Freq: Once | ORAL | Status: DC | PRN

## 2017-04-19 MED ORDER — SIMETHICONE 80 MG PO CHEW: 80.0000 mg | CHEWABLE_TABLET | ORAL | Status: DC | PRN

## 2017-04-19 MED ORDER — LACTATED RINGERS IV SOLN: INTRAVENOUS | Status: DC

## 2017-04-19 MED ORDER — MEPERIDINE HCL 25 MG/ML IJ SOLN: 6.2500 mg | INTRAMUSCULAR | Status: DC | PRN

## 2017-04-19 MED ORDER — MEASLES, MUMPS & RUBELLA VAC ~~LOC~~ INJ
0.5000 mL | INJECTION | Freq: Once | SUBCUTANEOUS | Status: DC
  Filled 2017-04-19: qty 0.5

## 2017-04-19 MED ORDER — CHLOROPROCAINE HCL (PF) 3 % IJ SOLN
INTRAMUSCULAR | Status: DC | PRN
  Administered 2017-04-19: 20 mL

## 2017-04-19 MED ORDER — METOCLOPRAMIDE HCL 5 MG/ML IJ SOLN
INTRAMUSCULAR | Status: AC
  Filled 2017-04-19: qty 2

## 2017-04-19 MED ORDER — DIPHENHYDRAMINE HCL 25 MG PO CAPS
25.0000 mg | ORAL_CAPSULE | ORAL | Status: DC | PRN
  Filled 2017-04-19: qty 1

## 2017-04-19 MED ORDER — SCOPOLAMINE 1 MG/3DAYS TD PT72
1.0000 | MEDICATED_PATCH | Freq: Once | TRANSDERMAL | Status: DC
  Administered 2017-04-19: 1.5 mg via TRANSDERMAL

## 2017-04-19 MED ORDER — METOCLOPRAMIDE HCL 5 MG/ML IJ SOLN
INTRAMUSCULAR | Status: DC | PRN
  Administered 2017-04-19: 10 mg via INTRAVENOUS

## 2017-04-19 MED ORDER — OXYTOCIN 40 UNITS IN LACTATED RINGERS INFUSION - SIMPLE MED: 2.5000 [IU]/h | INTRAVENOUS | Status: AC

## 2017-04-19 MED ORDER — NALBUPHINE HCL 10 MG/ML IJ SOLN
5.0000 mg | INTRAMUSCULAR | Status: DC | PRN
  Administered 2017-04-19: 5 mg via INTRAVENOUS
  Filled 2017-04-19: qty 1

## 2017-04-19 MED ORDER — OXYCODONE HCL 5 MG/5ML PO SOLN: 5.0000 mg | Freq: Once | ORAL | Status: DC | PRN

## 2017-04-19 MED ORDER — LACTATED RINGERS IV SOLN
INTRAVENOUS | Status: DC
  Administered 2017-04-19 (×2): via INTRAVENOUS

## 2017-04-19 MED ORDER — PHENYLEPHRINE 40 MCG/ML (10ML) SYRINGE FOR IV PUSH (FOR BLOOD PRESSURE SUPPORT)
PREFILLED_SYRINGE | INTRAVENOUS | Status: AC
  Filled 2017-04-19: qty 10

## 2017-04-19 MED ORDER — ENOXAPARIN SODIUM 40 MG/0.4ML ~~LOC~~ SOLN
40.0000 mg | SUBCUTANEOUS | Status: DC
  Administered 2017-04-20 – 2017-04-21 (×2): 40 mg via SUBCUTANEOUS
  Filled 2017-04-19 (×2): qty 0.4

## 2017-04-19 MED ORDER — COCONUT OIL OIL: 1.0000 "application " | TOPICAL_OIL | Status: DC | PRN

## 2017-04-19 MED ORDER — WITCH HAZEL-GLYCERIN EX PADS
1.0000 | MEDICATED_PAD | CUTANEOUS | Status: DC | PRN
Start: 2017-04-19 — End: 2017-04-21

## 2017-04-19 MED ORDER — BUPIVACAINE IN DEXTROSE 0.75-8.25 % IT SOLN
INTRATHECAL | Status: AC
  Filled 2017-04-19: qty 2

## 2017-04-19 MED ORDER — MENTHOL 3 MG MT LOZG: 1.0000 | LOZENGE | OROMUCOSAL | Status: DC | PRN

## 2017-04-19 MED ORDER — PHENYLEPHRINE 8 MG IN D5W 100 ML (0.08MG/ML) PREMIX OPTIME
INJECTION | INTRAVENOUS | Status: AC
  Filled 2017-04-19: qty 100

## 2017-04-19 MED ORDER — ONDANSETRON HCL 4 MG/2ML IJ SOLN
INTRAMUSCULAR | Status: DC | PRN
  Administered 2017-04-19: 4 mg via INTRAVENOUS

## 2017-04-19 MED ORDER — DIPHENHYDRAMINE HCL 25 MG PO CAPS: 25.0000 mg | ORAL_CAPSULE | Freq: Four times a day (QID) | ORAL | Status: DC | PRN

## 2017-04-19 MED ORDER — SODIUM CHLORIDE 0.9 % IR SOLN
Status: DC | PRN
  Administered 2017-04-19: 1

## 2017-04-19 MED ORDER — CHLOROPROCAINE HCL (PF) 3 % IJ SOLN
INTRAMUSCULAR | Status: AC
  Filled 2017-04-19: qty 20

## 2017-04-19 MED ORDER — NALBUPHINE HCL 10 MG/ML IJ SOLN: 5.0000 mg | INTRAMUSCULAR | Status: DC | PRN

## 2017-04-19 MED ORDER — MORPHINE SULFATE (PF) 0.5 MG/ML IJ SOLN
INTRAMUSCULAR | Status: DC | PRN
  Administered 2017-04-19: 200 ug via INTRATHECAL

## 2017-04-19 MED ORDER — MEPERIDINE HCL 25 MG/ML IJ SOLN
INTRAMUSCULAR | Status: AC
  Filled 2017-04-19: qty 1

## 2017-04-19 MED ORDER — BUPIVACAINE IN DEXTROSE 0.75-8.25 % IT SOLN
INTRATHECAL | Status: DC | PRN
  Administered 2017-04-19: 1.6 mL via INTRATHECAL

## 2017-04-19 MED ORDER — FENTANYL CITRATE (PF) 100 MCG/2ML IJ SOLN
INTRAMUSCULAR | Status: AC
  Filled 2017-04-19: qty 2

## 2017-04-19 MED ORDER — SIMETHICONE 80 MG PO CHEW
80.0000 mg | CHEWABLE_TABLET | ORAL | Status: DC
  Administered 2017-04-19 – 2017-04-20 (×2): 80 mg via ORAL
  Filled 2017-04-19 (×2): qty 1

## 2017-04-19 MED ORDER — LIDOCAINE HCL 1 % IJ SOLN
INTRAMUSCULAR | Status: DC | PRN
  Administered 2017-04-19: 20 mL

## 2017-04-19 MED ORDER — FENTANYL CITRATE (PF) 100 MCG/2ML IJ SOLN
INTRAMUSCULAR | Status: DC | PRN
  Administered 2017-04-19: 25 ug via INTRAVENOUS
  Administered 2017-04-19: 10 ug via INTRATHECAL
  Administered 2017-04-19: 15 ug via INTRAVENOUS
  Administered 2017-04-19 (×2): 25 ug via INTRAVENOUS

## 2017-04-19 MED ORDER — OXYTOCIN 10 UNIT/ML IJ SOLN
INTRAMUSCULAR | Status: AC
  Filled 2017-04-19: qty 4

## 2017-04-19 MED ORDER — NALOXONE HCL 4 MG/10ML IJ SOLN
1.0000 ug/kg/h | INTRAVENOUS | Status: DC | PRN
  Filled 2017-04-19: qty 5

## 2017-04-19 MED ORDER — DEXAMETHASONE SODIUM PHOSPHATE 4 MG/ML IJ SOLN
INTRAMUSCULAR | Status: AC
  Filled 2017-04-19: qty 1

## 2017-04-19 MED ORDER — LIDOCAINE HCL 1 % IJ SOLN
INTRAMUSCULAR | Status: AC
  Filled 2017-04-19: qty 1

## 2017-04-19 MED ORDER — SODIUM CHLORIDE 0.9% FLUSH: 3.0000 mL | INTRAVENOUS | Status: DC | PRN

## 2017-04-19 MED ORDER — DIBUCAINE 1 % RE OINT: 1.0000 "application " | TOPICAL_OINTMENT | RECTAL | Status: DC | PRN

## 2017-04-19 MED ORDER — KETOROLAC TROMETHAMINE 30 MG/ML IJ SOLN: 30.0000 mg | Freq: Four times a day (QID) | INTRAMUSCULAR | Status: AC | PRN

## 2017-04-19 MED ORDER — PROMETHAZINE HCL 25 MG/ML IJ SOLN: 6.2500 mg | INTRAMUSCULAR | Status: DC | PRN

## 2017-04-19 MED ORDER — ZOLPIDEM TARTRATE 5 MG PO TABS: 5.0000 mg | ORAL_TABLET | Freq: Every evening | ORAL | Status: DC | PRN

## 2017-04-19 MED ORDER — SENNOSIDES-DOCUSATE SODIUM 8.6-50 MG PO TABS
2.0000 | ORAL_TABLET | ORAL | Status: DC
  Administered 2017-04-19 – 2017-04-20 (×2): 2 via ORAL
  Filled 2017-04-19 (×2): qty 2

## 2017-04-19 MED ORDER — AMLODIPINE BESYLATE 2.5 MG PO TABS
2.5000 mg | ORAL_TABLET | Freq: Every day | ORAL | Status: DC
  Filled 2017-04-19 (×2): qty 1

## 2017-04-19 MED ORDER — OXYTOCIN 10 UNIT/ML IJ SOLN
INTRAVENOUS | Status: DC | PRN
  Administered 2017-04-19: 40 [IU] via INTRAVENOUS

## 2017-04-19 MED ORDER — ONDANSETRON HCL 4 MG/2ML IJ SOLN
INTRAMUSCULAR | Status: AC
  Filled 2017-04-19: qty 2

## 2017-04-19 MED ORDER — CEFAZOLIN SODIUM-DEXTROSE 2-4 GM/100ML-% IV SOLN
2.0000 g | INTRAVENOUS | Status: AC
  Administered 2017-04-19: 2 g via INTRAVENOUS

## 2017-04-19 MED ORDER — MEPERIDINE HCL 25 MG/ML IJ SOLN
INTRAMUSCULAR | Status: DC | PRN
  Administered 2017-04-19: 12.5 mg via INTRAVENOUS

## 2017-04-19 MED ORDER — DIPHENHYDRAMINE HCL 50 MG/ML IJ SOLN: 12.5000 mg | INTRAMUSCULAR | Status: DC | PRN

## 2017-04-19 MED ORDER — NALOXONE HCL 0.4 MG/ML IJ SOLN: 0.4000 mg | INTRAMUSCULAR | Status: DC | PRN

## 2017-04-19 MED ORDER — SOD CITRATE-CITRIC ACID 500-334 MG/5ML PO SOLN
ORAL | Status: AC
  Filled 2017-04-19: qty 15

## 2017-04-19 MED ORDER — PRENATAL MULTIVITAMIN CH
1.0000 | ORAL_TABLET | Freq: Every day | ORAL | Status: DC
  Administered 2017-04-20 – 2017-04-21 (×2): 1 via ORAL
  Filled 2017-04-19 (×2): qty 1

## 2017-04-19 MED ORDER — IBUPROFEN 600 MG PO TABS
600.0000 mg | ORAL_TABLET | Freq: Four times a day (QID) | ORAL | Status: DC
  Administered 2017-04-20 – 2017-04-21 (×7): 600 mg via ORAL
  Filled 2017-04-19 (×7): qty 1

## 2017-04-19 MED ORDER — PHENYLEPHRINE 8 MG IN D5W 100 ML (0.08MG/ML) PREMIX OPTIME
INJECTION | INTRAVENOUS | Status: DC | PRN
  Administered 2017-04-19: 60 ug/min via INTRAVENOUS

## 2017-04-19 MED ORDER — LACTATED RINGERS IV SOLN
INTRAVENOUS | Status: DC | PRN
  Administered 2017-04-19: 11:00:00 via INTRAVENOUS

## 2017-04-19 MED ORDER — MORPHINE SULFATE (PF) 0.5 MG/ML IJ SOLN
INTRAMUSCULAR | Status: AC
  Filled 2017-04-19: qty 10

## 2017-04-19 MED ORDER — ACETAMINOPHEN 325 MG PO TABS
650.0000 mg | ORAL_TABLET | ORAL | Status: DC | PRN
  Administered 2017-04-21: 650 mg via ORAL
  Filled 2017-04-19: qty 2

## 2017-04-19 MED ORDER — ACETAMINOPHEN 500 MG PO TABS
1000.0000 mg | ORAL_TABLET | Freq: Four times a day (QID) | ORAL | Status: AC
  Administered 2017-04-19 – 2017-04-20 (×4): 1000 mg via ORAL
  Filled 2017-04-19 (×5): qty 2

## 2017-04-19 MED ORDER — SCOPOLAMINE 1 MG/3DAYS TD PT72
MEDICATED_PATCH | TRANSDERMAL | Status: AC
  Filled 2017-04-19: qty 1

## 2017-04-19 MED ORDER — KETOROLAC TROMETHAMINE 30 MG/ML IJ SOLN
30.0000 mg | Freq: Four times a day (QID) | INTRAMUSCULAR | Status: AC | PRN
  Administered 2017-04-19: 30 mg via INTRAVENOUS
  Filled 2017-04-19: qty 1

## 2017-04-19 MED ORDER — NALBUPHINE HCL 10 MG/ML IJ SOLN: 5.0000 mg | Freq: Once | INTRAMUSCULAR | Status: DC | PRN

## 2017-04-19 MED ORDER — TETANUS-DIPHTH-ACELL PERTUSSIS 5-2.5-18.5 LF-MCG/0.5 IM SUSP: 0.5000 mL | Freq: Once | INTRAMUSCULAR | Status: DC

## 2017-04-19 MED ORDER — ONDANSETRON HCL 4 MG/2ML IJ SOLN: 4.0000 mg | Freq: Three times a day (TID) | INTRAMUSCULAR | Status: DC | PRN

## 2017-04-19 SURGICAL SUPPLY — 45 items
ADH SKN CLS APL DERMABOND .7 (GAUZE/BANDAGES/DRESSINGS) ×2
CHLORAPREP W/TINT 26ML (MISCELLANEOUS) ×3 IMPLANT
CLAMP CORD UMBIL (MISCELLANEOUS) IMPLANT
CLIP FILSHIE TUBAL LIGA STRL (Clip) ×2 IMPLANT
CLOTH BEACON ORANGE TIMEOUT ST (SAFETY) ×3 IMPLANT
DERMABOND ADVANCED (GAUZE/BANDAGES/DRESSINGS) ×4
DERMABOND ADVANCED .7 DNX12 (GAUZE/BANDAGES/DRESSINGS) IMPLANT
DRAIN JACKSON PRT FLT 7MM (DRAIN) IMPLANT
DRSG OPSITE POSTOP 4X10 (GAUZE/BANDAGES/DRESSINGS) ×3 IMPLANT
ELECT REM PT RETURN 9FT ADLT (ELECTROSURGICAL) ×3
ELECTRODE REM PT RTRN 9FT ADLT (ELECTROSURGICAL) ×1 IMPLANT
EVACUATOR SILICONE 100CC (DRAIN) IMPLANT
EXTRACTOR VACUUM M CUP 4 TUBE (SUCTIONS) IMPLANT
EXTRACTOR VACUUM M CUP 4' TUBE (SUCTIONS)
GLOVE BIO SURGEON STRL SZ7 (GLOVE) ×3 IMPLANT
GLOVE BIOGEL PI IND STRL 7.0 (GLOVE) ×2 IMPLANT
GLOVE BIOGEL PI INDICATOR 7.0 (GLOVE) ×4
GOWN STRL REUS W/TWL LRG LVL3 (GOWN DISPOSABLE) ×6 IMPLANT
KIT ABG SYR 3ML LUER SLIP (SYRINGE) IMPLANT
NDL HYPO 25X5/8 SAFETYGLIDE (NEEDLE) ×1 IMPLANT
NDL SAFETY ECLIPSE 18X1.5 (NEEDLE) IMPLANT
NEEDLE HYPO 18GX1.5 SHARP (NEEDLE) ×3
NEEDLE HYPO 22GX1.5 SAFETY (NEEDLE) ×2 IMPLANT
NEEDLE HYPO 25X5/8 SAFETYGLIDE (NEEDLE) ×3 IMPLANT
NS IRRIG 1000ML POUR BTL (IV SOLUTION) ×3 IMPLANT
PACK C SECTION WH (CUSTOM PROCEDURE TRAY) ×3 IMPLANT
PAD ABD 7.5X8 STRL (GAUZE/BANDAGES/DRESSINGS) ×4 IMPLANT
PAD OB MATERNITY 4.3X12.25 (PERSONAL CARE ITEMS) ×3 IMPLANT
PENCIL SMOKE EVAC W/HOLSTER (ELECTROSURGICAL) ×3 IMPLANT
RETAINER VISCERAL (MISCELLANEOUS) ×2 IMPLANT
RETRACTOR TRAXI PANNICULUS (MISCELLANEOUS) IMPLANT
RTRCTR C-SECT PINK 25CM LRG (MISCELLANEOUS) ×3 IMPLANT
SPONGE LAP 18X18 X RAY DECT (DISPOSABLE) ×2 IMPLANT
SUT PLAIN 2 0 XLH (SUTURE) ×2 IMPLANT
SUT VIC AB 0 CTX 36 (SUTURE) ×15
SUT VIC AB 0 CTX36XBRD ANBCTRL (SUTURE) ×5 IMPLANT
SUT VIC AB 2-0 SH 27 (SUTURE) ×3
SUT VIC AB 2-0 SH 27XBRD (SUTURE) IMPLANT
SUT VIC AB 4-0 KS 27 (SUTURE) ×3 IMPLANT
SYR 10ML LL (SYRINGE) ×2 IMPLANT
SYR CONTROL 10ML LL (SYRINGE) ×2 IMPLANT
TAPE CLOTH SURG 4X10 WHT LF (GAUZE/BANDAGES/DRESSINGS) ×2 IMPLANT
TOWEL OR 17X24 6PK STRL BLUE (TOWEL DISPOSABLE) ×3 IMPLANT
TRAXI PANNICULUS RETRACTOR (MISCELLANEOUS) ×2
TRAY FOLEY BAG SILVER LF 14FR (SET/KITS/TRAYS/PACK) ×3 IMPLANT

## 2017-04-19 NOTE — Plan of Care (Signed)
[facility] having a repeat C/s with BTL.  She states that even after IV medication her pain is still a 6.  Emotional support provided as well as education about incisional pain.  Patient states she is O.K. At the moment with this pain level.  Requested apple juice; patient given apple juice.  Requested to eat; RN educated patient about increasing her diet slowly to avoid post op vomiting.  Admission packet/papers reviewed and signed.  Will continue to monitor. Patient states she is itching severely; RN stated she would give Nubain for itching.

## 2017-04-19 NOTE — Op Note (Signed)
Breanna EmeryMary E Moreno PROCEDURE DATE: 04/19/2017  PREOPERATIVE DIAGNOSES: Intrauterine pregnancy at 1526w0d weeks gestation; previous uterine incision  POSTOPERATIVE DIAGNOSES: The same  PROCEDURE: Repeat Low Transverse Cesarean Section  SURGEON, primary: Elsie LincolnKelly Leggett, MD SURGEON, fellow: Rolm BookbinderAmber Litisha Guagliardo, DO  ANESTHESIOLOGY TEAM: Anesthesiologist: Beryle LatheBrock, Thomas E, MD CRNA: Graciela HusbandsFussell, Wynn O, CRNA  INDICATIONS: [name] is a 27 y.o. [phone] at 4226w0d here for cesarean section secondary to the indications listed under preoperative diagnoses; please see preoperative note for further details.  The risks of cesarean section were discussed with the patient including but were not limited to: bleeding which may require transfusion or reoperation; infection which may require antibiotics; injury to bowel, bladder, ureters or other surrounding organs; injury to the fetus; need for additional procedures including hysterectomy in the event of a life-threatening hemorrhage; placental abnormalities wth subsequent pregnancies, incisional problems, thromboembolic phenomenon and other postoperative/anesthesia complications.   The patient concurred with the proposed plan, giving informed written consent for the procedure.    FINDINGS:  Viable female infant in cephalic presentation.  Apgars 9 and 9.  Clear amniotic fluid.  Intact placenta, three vessel cord.  Normal uterus, fallopian tubes and ovaries bilaterally.  ANESTHESIA: Spina  ESTIMATED BLOOD LOSS: 667 ml URINE OUTPUT:  300 ml SPECIMENS: Placenta sent to L&D COMPLICATIONS: None immediate  PROCEDURE IN DETAIL:  The patient preoperatively received intravenous antibiotics and had sequential compression devices applied to her lower extremities.  She was then taken to the operating room where spinal anesthesia was administered and was found to be adequate. She was then placed in a dorsal supine position with a leftward tilt, and prepped and draped in a sterile manner.  A  foley catheter was placed into her bladder and attached to constant gravity.  After an adequate timeout was performed, a Pfannenstiel skin incision was made with scalpel over her preexisting scarand carried through to the underlying layer of fascia. The fascia was incised in the midline, and this incision was extended bilaterally using the Mayo scissors.  Kocher clamps were applied to the superior aspect of the fascial incision and the underlying rectus muscles were dissected off bluntly.  A similar process was carried out on the inferior aspect of the fascial incision. The rectus muscles were separated in the midline bluntly and the peritoneum was entered bluntly. Attention was turned to the lower uterine segment where a low transverse hysterotomy was made with a scalpel and extended bilaterally bluntly.  The infant was successfully delivered, the cord was clamped and cut after one minute, and the infant was handed over to the awaiting neonatology team. Uterine massage was then administered, and the placenta delivered intact with a three-vessel cord. The uterus was then cleared of clots and debris.  The hysterotomy was closed with 0 Vicryl in a running locked fashion. Figure-of-eight 0 Vicryl serosal stitches were placed to help with hemostasis. Right fallopian tube identified and Filshie clip applied. Then, left fallopian tube identified and Filshie clip applied.The pelvis was cleared of all clot and debris. Hemostasis was confirmed on all surfaces.  The peritoneum was closed with a 0 Vicryl running stitch.The fascia was then closed using 0 Vicryl in a running fashion.  The subcutaneous layer was irrigated, then reapproximated with 2-0 plain gut interrupted stitches, and 20 ml of 1% lidocaine was injected subcutaneously around the incision.  The skin was closed with a 4-0 Vicryl subcuticular stitch. The patient tolerated the procedure well. Sponge, lap, instrument and needle counts were correct x 3.  She was taken  to the recovery room in stable condition.    Rolm Bookbinder, DO Faculty Practice, [facility]

## 2017-04-19 NOTE — Anesthesia Procedure Notes (Signed)
Spinal  Patient location during procedure: OR Start time: 04/19/2017 9:52 AM End time: 04/19/2017 10:02 AM Staffing Anesthesiologist: Beryle LatheBrock, Thomas E, MD Performed: anesthesiologist  Preanesthetic Checklist Completed: patient identified, surgical consent, pre-op evaluation, timeout performed, IV checked, risks and benefits discussed and monitors and equipment checked Spinal Block Patient position: sitting Prep: DuraPrep Patient monitoring: heart rate, cardiac monitor, continuous pulse ox and blood pressure Approach: midline Location: L3-4 Injection technique: single-shot Needle Needle type: Whitacre  Needle gauge: 25 G Additional Notes Functioning IV was confirmed and monitors were applied. Sterile prep and drape, including hand hygiene, mask, and sterile gloves were used. The patient was positioned and the spine was prepped. The skin was anesthetized with lidocaine. After unsuccessful attempt with with introducer and pencan needle, utilized Tuohy to access epidural space. Space reached with tuohy fully hubbed and significant indentation of lower back. 25ga whitacre passed through Ugandatuohy. Free flow of clear CSF was obtained prior to injecting local anesthetic into the CSF. The spinal needle aspirated freely following injection. The needles were carefully withdrawn. The patient tolerated the procedure well. Consent was obtained prior to the procedure with all questions answered and concerns addressed. Risks including, but not limited to, bleeding, infection, nerve damage, paralysis, failed block, inadequate analgesia, allergic reaction, high spinal, itching, and headache were discussed and the patient wished to proceed.  Leslye Peerhomas Brock, MD

## 2017-04-19 NOTE — Anesthesia Preprocedure Evaluation (Addendum)
Anesthesia Evaluation  Patient identified by MRN, date of birth, ID band Patient awake    Reviewed: Allergy & Precautions, NPO status , Patient's Chart, lab work & pertinent test results  Airway Mallampati: II  TM Distance: >3 FB Neck ROM: Full    Dental  (+) Dental Advisory Given   Pulmonary former smoker,    Pulmonary exam normal breath sounds clear to auscultation       Cardiovascular hypertension, Normal cardiovascular exam Rhythm:Regular Rate:Normal     Neuro/Psych  Headaches, negative psych ROS   GI/Hepatic negative GI ROS, Neg liver ROS,   Endo/Other  Morbid obesity  Renal/GU negative Renal ROS  negative genitourinary   Musculoskeletal negative musculoskeletal ROS (+)   Abdominal (+) + obese,   Peds  Hematology negative hematology ROS (+)   Anesthesia Other Findings   Reproductive/Obstetrics (+) Pregnancy Cervical incompetence                             Anesthesia Physical Anesthesia Plan  ASA: III  Anesthesia Plan: Spinal   Post-op Pain Management:    Induction:   PONV Risk Score and Plan: Treatment may vary due to age or medical condition  Airway Management Planned: Natural Airway and Nasal Cannula  Additional Equipment: None  Intra-op Plan:   Post-operative Plan:   Informed Consent: I have reviewed the patients History and Physical, chart, labs and discussed the procedure including the risks, benefits and alternatives for the proposed anesthesia with the patient or authorized representative who has indicated his/her understanding and acceptance.   Dental advisory given  Plan Discussed with: CRNA  Anesthesia Plan Comments:         Anesthesia Quick Evaluation

## 2017-04-19 NOTE — Addendum Note (Signed)
Addendum  created 04/19/17 1554 by Graciela HusbandsFussell, Caius Silbernagel O, CRNA   Sign clinical note

## 2017-04-19 NOTE — Progress Notes (Addendum)
Orthostatic vitals signs done before standing patient at the side of the bed. Lying 126/77, sitting 144/90, standing 150/98. Notified MD(Dr. Rushie GoltzLeland)

## 2017-04-19 NOTE — Transfer of Care (Signed)
Immediate Anesthesia Transfer of Care Note  Patient: [name]  Procedure(s) Performed: CESAREAN SECTION WITH BILATERAL TUBAL LIGATION (Bilateral )  Patient Location: PACU  Anesthesia Type:Spinal  Level of Consciousness: awake, alert  and oriented  Airway & Oxygen Therapy: Patient Spontanous Breathing  Post-op Assessment: Report given to RN and Post -op Vital signs reviewed and stable  Post vital signs: Reviewed and stable  Last Vitals:  Vitals:   04/19/17 0739 04/19/17 1150  BP: 137/89 133/82  Pulse: (!) 104 85  Resp:  (!) 29  Temp: 36.9 C 37.2 C  SpO2:  100%    Last Pain:  Vitals:   04/19/17 1150  TempSrc: Oral  PainSc:       Patients Stated Pain Goal: 10 (04/19/17 0754)  Complications: No apparent anesthesia complications

## 2017-04-19 NOTE — Anesthesia Postprocedure Evaluation (Signed)
Anesthesia Post Note  Patient: [name]  Procedure(s) Performed: CESAREAN SECTION WITH BILATERAL TUBAL LIGATION (Bilateral )     Patient location during evaluation: Mother Baby Anesthesia Type: Spinal Level of consciousness: awake and alert and oriented Pain management: satisfactory to patient Vital Signs Assessment: post-procedure vital signs reviewed and stable Respiratory status: respiratory function stable and spontaneous breathing Cardiovascular status: blood pressure returned to baseline Postop Assessment: no headache, no backache, spinal receding, patient able to bend at knees and adequate PO intake Anesthetic complications: no    Last Vitals:  Vitals:   04/19/17 1457 04/19/17 1503  BP: (!) 145/88 (!) 143/99  Pulse: 87   Resp:    Temp:    SpO2: 100%     Last Pain:  Vitals:   04/19/17 1500  TempSrc:   PainSc: 4    Pain Goal: Patients Stated Pain Goal: 7 (04/19/17 1150)               Mairely Foxworth

## 2017-04-19 NOTE — Anesthesia Postprocedure Evaluation (Signed)
Anesthesia Post Note  Patient: [name]  Procedure(s) Performed: CESAREAN SECTION WITH BILATERAL TUBAL LIGATION (Bilateral )     Patient location during evaluation: PACU Anesthesia Type: Spinal Level of consciousness: awake and alert Pain management: satisfactory to patient Vital Signs Assessment: post-procedure vital signs reviewed and stable Respiratory status: spontaneous breathing and respiratory function stable Cardiovascular status: blood pressure returned to baseline and stable Postop Assessment: spinal receding and no apparent nausea or vomiting Anesthetic complications: no    Last Vitals:  Vitals:   04/19/17 1230 04/19/17 1302  BP: (!) 134/94 129/80  Pulse: 83 79  Resp: 19   Temp:  36.8 C  SpO2: 100% 100%    Last Pain:  Vitals:   04/19/17 1315  TempSrc:   PainSc: 6    Pain Goal: Patients Stated Pain Goal: 7 (04/19/17 1150)               Beryle Lathehomas E Danity Schmelzer

## 2017-04-19 NOTE — Anesthesia Rounding Note (Signed)
  [facility] Epidural Rounding Note  Patient: [name], 27 y.o., female  Patient's current pain level: Pain Score: 7  (04/19/17 1150)  Agreed upon pain management level: 7  Epidural intervention: No   Comments: L&D RN/PACU to check orders for pain relief options.  [name] 04/19/2017

## 2017-04-19 NOTE — H&P (Signed)
Breanna EmeryMary E Moreno is a 27 y.o. female presenting for elective rpt c/s. OB History    Gravida Para Term Preterm AB Living   5 1   1 3 2    SAB TAB Ectopic Multiple Live Births   2 1   1 2       Obstetric Comments   D&C     Past Medical History:  Diagnosis Date  . Bacterial vaginosis 08/15/2016  . Headache(784.0)   . Hypertension   . Incompetent cervix   . Pregnancy induced hypertension   . Preterm labor   . Urinary tract infection    Past Surgical History:  Procedure Laterality Date  . BREAST FIBROADENOMA SURGERY  2006-7.   Removal;right  . CESAREAN SECTION    . DILATION AND CURETTAGE OF UTERUS    . THERAPEUTIC ABORTION     Family History: family history includes Cancer in her maternal grandmother; Diabetes in her maternal grandmother and mother; Hypertension in her maternal grandmother. Social History:  reports that she quit smoking about 8 months ago. Her smoking use included cigarettes. She smoked 0.25 packs per day. She has quit using smokeless tobacco. She reports that she does not drink alcohol or use drugs.     Maternal Diabetes: No Genetic Screening: Normal Maternal Ultrasounds/Referrals: Normal Fetal Ultrasounds or other Referrals:  None Maternal Substance Abuse:  No Significant Maternal Medications:  None Significant Maternal Lab Results:  None Other Comments:  None  Review of Systems  Constitutional: Negative for chills and fever.  Eyes: Negative for blurred vision.  Respiratory: Negative.   Cardiovascular: Negative.   Gastrointestinal: Negative.   Musculoskeletal: Negative.   Skin: Negative for rash.  Neurological: Negative.  Negative for headaches.  Psychiatric/Behavioral: Negative.    History   Blood pressure 137/89, pulse (!) 104, temperature 98.4 F (36.9 C), temperature source Oral, height 5\' 4"  (1.626 m), weight 253 lb (114.8 kg), last menstrual period 07/11/2016. Exam Physical Exam  Vitals reviewed. Constitutional: She is oriented to person,  place, and time. She appears well-developed and well-nourished. No distress.  HENT:  Head: Normocephalic and atraumatic.  Eyes: Conjunctivae are normal.  Neck: Neck supple. No thyromegaly present.  Cardiovascular: Normal rate and regular rhythm.  Respiratory: Effort normal and breath sounds normal.  GI: Soft. There is no tenderness.  Musculoskeletal: Normal range of motion. She exhibits no tenderness.  Neurological: She is alert and oriented to person, place, and time.  Skin: Skin is warm and dry.  Psychiatric: She has a normal mood and affect.    Prenatal labs: ABO, Rh: --/--/O POS (01/10 0920) Antibody: NEG (01/10 0920) Rubella: 3.01 (06/14 1353) RPR: Non Reactive (01/10 0920)  HBsAg: Negative (06/14 1353)  HIV: Non Reactive (10/26 1105)  GBS: Negative (12/20 1625)   Assessment/Plan: 27 yo G5P0132 at 39 weeks for rpt c/s and BTL.    The risks of cesarean section discussed with the patient included but were not limited to: bleeding which may require transfusion or reoperation; infection which may require antibiotics; injury to bowel, bladder, ureters or other surrounding organs; injury to the fetus; need for additional procedures including hysterectomy in the event of a life-threatening hemorrhage; placental abnormalities wth subsequent pregnancies, incisional problems, thromboembolic phenomenon and other postoperative/anesthesia complications. The patient concurred with the proposed plan, giving informed written consent for the procedure.   Patient desires permanent sterilization.  Other reversible forms of contraception were discussed with patient; she declines all other modalities. Risks of procedure discussed with patient including but not limited  to: risk of regret, permanence of method, bleeding, infection, injury to surrounding organs and need for additional procedures.  Failure risk of 0.5-1% with increased risk of ectopic gestation if pregnancy occurs was also discussed with  patient.    Patient verbalized understanding of these risks and wants to proceed with sterilization.  Written informed consent obtained.  To OR when ready.  Ancef ordered for surgical site infection prophylaxis.    Elsie Lincoln 04/19/2017, 9:01 AM

## 2017-04-20 LAB — CBC
HCT: 28.5 % — ABNORMAL LOW (ref 36.0–46.0)
Hemoglobin: 9.8 g/dL — ABNORMAL LOW (ref 12.0–15.0)
MCH: 29.4 pg (ref 26.0–34.0)
MCHC: 34.4 g/dL (ref 30.0–36.0)
MCV: 85.6 fL (ref 78.0–100.0)
Platelets: 225 10*3/uL (ref 150–400)
RBC: 3.33 MIL/uL — ABNORMAL LOW (ref 3.87–5.11)
RDW: 13.2 % (ref 11.5–15.5)
WBC: 14.4 10*3/uL — ABNORMAL HIGH (ref 4.0–10.5)

## 2017-04-20 MED ORDER — OXYCODONE-ACETAMINOPHEN 5-325 MG PO TABS
1.0000 | ORAL_TABLET | Freq: Four times a day (QID) | ORAL | Status: DC | PRN
  Filled 2017-04-20: qty 1

## 2017-04-20 MED ORDER — OXYCODONE HCL 5 MG PO TABS
5.0000 mg | ORAL_TABLET | Freq: Four times a day (QID) | ORAL | Status: DC | PRN
  Administered 2017-04-20 – 2017-04-21 (×4): 5 mg via ORAL
  Filled 2017-04-20 (×4): qty 1

## 2017-04-20 MED ORDER — FERROUS SULFATE 325 (65 FE) MG PO TABS
325.0000 mg | ORAL_TABLET | Freq: Two times a day (BID) | ORAL | Status: DC
  Administered 2017-04-20 – 2017-04-21 (×3): 325 mg via ORAL
  Filled 2017-04-20 (×3): qty 1

## 2017-04-20 NOTE — Lactation Note (Signed)
This note was copied from a baby's chart. Lactation Consultation Note  Patient Name: [name] ZOXWR'UToday's Date: 04/20/2017 Reason for consult: Initial assessment   Initial assessment with mom of 31 hour old infant. Infant with 2 BF for 10-12 minutes, 4 BF attempts, formula x 7 of 5-30 minutes, 5 voids and 7 stools in the last 24 hours. Infant weight 7 lb 6.3 oz with 4% weight loss since birth.   Mom reports she is latching infant to the breast with each feeding and then following with formula. Reviewed milk coming to volume and supply and demand.   Mom requested a DEBP to begin pumping. DEBP set up with instructions for use on Initiate setting, assembling, disassembling and cleaning of pump parts. Enc mom to pump about every 2-3 hours post BF to stimulate milk production. Mom voiced understanding and reports she pumped with her twins.   BF Resources handout and LC Brochure given, mom informed of IP/OP Services, BF Support Groups and LC phone #. [facility] and aware to call and make appt post d/c if not seen in hospital. Mom plans to take a manual pump home and [facility].   Mom has no further questions/concerns at this time. Enc mom to call out with any questions/concerns as needed.      Maternal Data Formula Feeding for Exclusion: Yes Reason for exclusion: Mother's choice to formula and breast feed on admission Has patient been taught Hand Expression?: Yes Does the patient have breastfeeding experience prior to this delivery?: Yes  Feeding Nipple Type: Slow - flow  LATCH Score                   Interventions    Lactation Tools Discussed/Used WIC Program: Yes Pump Review: Setup, frequency, and cleaning;Milk Storage Initiated by:: Breanna StainSharon Libby Goehring, RN, IBCLC Date initiated:: 04/20/17   Consult Status Consult Status: Follow-up Date: 04/21/17 Follow-up type: In-patient    [name] 04/20/2017, 6:39 PM

## 2017-04-20 NOTE — Progress Notes (Signed)
PostOp Day 1  Subjective: no complaints, up ad lib, voiding and tolerating PO  Has not had flatus yet.  Incisional pain is well controlled.   Objective: Blood pressure 132/80, pulse 88, temperature 98.5 F (36.9 C), temperature source Oral, resp. rate 18, height 5\' 4"  (1.626 m), weight 114.8 kg (253 lb), last menstrual period 07/11/2016, SpO2 100 %, unknown if currently breastfeeding.  Physical Exam:  General: alert and no distress Lochia: appropriate Uterine Fundus: firm Incision: healing well, no significant drainage, no significant erythema DVT Evaluation: No evidence of DVT seen on physical exam. No cords or calf tenderness.  Recent Labs    04/19/17 0800 04/20/17 0558  HGB 12.7 9.8*  HCT 37.0 28.5*    Assessment/Plan: Continue FeSulf BID.  Likely plan for discharge tomorrow given hemoglobin is stable.    LOS: 1 day   Freddrick MarchYashika Amin, MD  04/20/2017, 8:20 AM   OB FELLOW POSTPARTUM PROGRESS NOTE ATTESTATION  I have seen and examined this patient and agree with above documentation in the resident's note.   Frederik PearJulie P Pamlea Finder, MD OB Fellow 9:58 AM  \

## 2017-04-21 MED ORDER — FERROUS SULFATE 325 (65 FE) MG PO TABS: 325.0000 mg | ORAL_TABLET | Freq: Two times a day (BID) | ORAL | 3 refills | Status: DC

## 2017-04-21 MED ORDER — AMLODIPINE BESYLATE 5 MG PO TABS: 5.0000 mg | ORAL_TABLET | Freq: Every day | ORAL | 0 refills | Status: DC

## 2017-04-21 NOTE — Discharge Instructions (Signed)

## 2017-04-21 NOTE — Discharge Summary (Signed)
OB Discharge Summary  Patient Name: [name] DOB: 12-08-1990 MRN: [id number]  Date of admission: 04/19/2017 Delivering MD: Elsie LincolnLEGGETT, KELLY H   Date of discharge: 04/21/2017  Admitting diagnosis: RCS Undesired Fertility Intrauterine pregnancy: 6392w0d     Secondary diagnosis:  Principal Problem:   Status post cesarean section Active Problems:   Delivery by elective cesarean section  Additional problems: Hypertension during pregnancy      Discharge diagnosis: Term Pregnancy Delivered and Gestational Hypertension                                                                                                Post partum procedures:none  Augmentation: none  Complications: None  Hospital course:  Sceduled C/S   27 y.o. yo [id number] at 5392w0d was admitted to the hospital 04/19/2017 for scheduled cesarean section with the following indication:Elective Repeat.  Membrane Rupture Time/Date: 10:33 AM ,04/19/2017   Patient delivered a Viable infant.04/19/2017  Details of operation can be found in separate operative note.  Pateint had an uncomplicated postpartum course.  She is ambulating, tolerating a regular diet, passing flatus, and urinating well. Patient is discharged home in stable condition on  04/21/17         Physical exam  Vitals:   04/19/17 2300 04/20/17 0919 04/20/17 1844 04/21/17 0500  BP: 132/80 98/66 121/79 120/85  Pulse: 88 85 85 90  Resp: 18 16 16 18   Temp: 98.5 F (36.9 C) 98.5 F (36.9 C) 99.3 F (37.4 C) 98.1 F (36.7 C)  TempSrc: Oral Oral Oral Oral  SpO2:      Weight:      Height:       General: alert, cooperative and no distress Lochia: appropriate Uterine Fundus: firm Incision: Dressing is clean, dry, and intact, pressure dressing still in place  DVT Evaluation: No evidence of DVT seen on physical exam. Negative Homan's sign. No significant calf/ankle edema. Labs: Lab Results  Component Value Date   WBC 14.4 (H) 04/20/2017   HGB 9.8 (L) 04/20/2017   HCT 28.5 (L) 04/20/2017   MCV 85.6 04/20/2017   PLT 225 04/20/2017   CMP Latest Ref Rng & Units 04/19/2017  Glucose 65 - 99 mg/dL -  BUN 6 - 20 mg/dL -  Creatinine 1.470.44 - 8.291.00 mg/dL 5.620.84  Sodium 130135 - 865145 mmol/L -  Potassium 3.5 - 5.1 mmol/L -  Chloride 101 - 111 mmol/L -  CO2 22 - 32 mmol/L -  Calcium 8.9 - 10.3 mg/dL -  Total Protein 6.5 - 8.1 g/dL -  Total Bilirubin 0.3 - 1.2 mg/dL -  Alkaline Phos 38 - 784126 U/L -  AST 15 - 41 U/L -  ALT 14 - 54 U/L -    Discharge instruction: per After Visit Summary and "Baby and Me Booklet".  After visit meds:  Allergies as of 04/21/2017   No Known Allergies     Medication List    STOP taking these medications   cyclobenzaprine 10 MG tablet Commonly known as:  FLEXERIL   ondansetron 4 MG disintegrating tablet Commonly known as:  ZOFRAN ODT  terconazole 0.8 % vaginal cream Commonly known as:  TERAZOL 3     TAKE these medications   acetaminophen 500 MG tablet Commonly known as:  TYLENOL Take 1,000 mg by mouth every 6 (six) hours as needed for mild pain or headache.   amLODipine 5 MG tablet Commonly known as:  NORVASC Take 1 tablet (5 mg total) by mouth daily.   ferrous sulfate 325 (65 FE) MG tablet Take 1 tablet (325 mg total) by mouth 2 (two) times daily with a meal.       Diet: routine diet  Activity: Advance as tolerated. Pelvic rest for 6 weeks.   Outpatient follow up:2 weeks Follow up Appt: Future Appointments  Date Time Provider Department Center  05/03/2017 10:30 AM Orvilla Cornwall A, CNM [facility] None   Follow up Visit:No Follow-up on file.  Postpartum contraception: Tubal Ligation done inpatient   Newborn Data: Live born female  Birth Weight: 7 lb 11.8 oz (3510 g) APGAR: 9, 9  Newborn Delivery   Birth date/time:  04/19/2017 10:34:00 Delivery type:  C-Section, Low Transverse C-section categorization:  Repeat     Baby Feeding: Bottle and Breast Disposition:home with mother   04/21/2017 Sharyon Cable, CNM

## 2017-04-22 ENCOUNTER — Encounter (HOSPITAL_COMMUNITY): Payer: Self-pay

## 2017-04-22 NOTE — Progress Notes (Signed)
   PRENATAL VISIT NOTE  Subjective:  [name] is a 27 y.o. [id number] at 2364w0d being seen today for ongoing prenatal care.  She is currently monitored for the following issues for this low-risk pregnancy and has OBESITY; TOBACCO USE, QUIT; Supervision of other normal pregnancy, antepartum; History of C-section; History of pregnancy induced hypertension; Nausea vomiting and diarrhea; Urinary tract infection in mother during third trimester of pregnancy; Unwanted fertility; H/O pre-eclampsia in prior pregnancy, currently pregnant; Preeclampsia, third trimester; Braxton Hick's contraction; Delivery by elective cesarean section; and Status post cesarean section on their problem list.  Patient reports no complaints.  Contractions: Irregular. Vag. Bleeding: None.  Movement: Present. Denies leaking of fluid.   The following portions of the patient's history were reviewed and updated as appropriate: allergies, current medications, past family history, past medical history, past social history, past surgical history and problem list. Problem list updated.  Objective:   Vitals:   04/17/17 1024  BP: 130/84  Pulse: 99  Weight: 253 lb 9.6 oz (115 kg)    Fetal Status:     Movement: Present     General:  Alert, oriented and cooperative. Patient is in no acute distress.  Skin: Skin is warm and dry. No rash noted.   Cardiovascular: Normal heart rate noted  Respiratory: Normal respiratory effort, no problems with respiration noted  Abdomen: Soft, gravid, appropriate for gestational age.  Pain/Pressure: Present     Pelvic: Cervical exam deferred        Extremities: Normal range of motion.  Edema: Trace  Mental Status:  Normal mood and affect. Normal behavior. Normal judgment and thought content.   Assessment and Plan:  Pregnancy: [id number] at 5264w0d  1. Supervision of other normal pregnancy, antepartum     Doing well.  Repeat elective C-section scheduled for 04/19/17.    2. H/O pre-eclampsia in prior  pregnancy, currently pregnant     Normotensive range blood pressures, no medications.   Term labor symptoms and general obstetric precautions including but not limited to vaginal bleeding, contractions, leaking of fluid and fetal movement were reviewed in detail with the patient. Please refer to After Visit Summary for other counseling recommendations.  Return in about 2 weeks (around 05/01/2017) for incision check.   Roe Coombsachelle A [place] Franson, CNM.

## 2017-04-25 ENCOUNTER — Telehealth: Payer: Self-pay | Admitting: *Deleted

## 2017-04-25 ENCOUNTER — Inpatient Hospital Stay (HOSPITAL_COMMUNITY)
Admission: AD | Admit: 2017-04-25 | Discharge: 2017-04-26 | Disposition: A | Discharge status: Home / Self Care | Payer: Medicaid Other | Source: Ambulatory Visit | Attending: Obstetrics & Gynecology | Admitting: Obstetrics & Gynecology

## 2017-04-25 ENCOUNTER — Telehealth: Payer: Self-pay | Admitting: Obstetrics and Gynecology

## 2017-04-25 DIAGNOSIS — R519 Headache, unspecified: Secondary | ICD-10-CM

## 2017-04-25 DIAGNOSIS — O9089 Other complications of the puerperium, not elsewhere classified: Secondary | ICD-10-CM | POA: Insufficient documentation

## 2017-04-25 DIAGNOSIS — Z87891 Personal history of nicotine dependence: Secondary | ICD-10-CM | POA: Insufficient documentation

## 2017-04-25 DIAGNOSIS — T8149XA Infection following a procedure, other surgical site, initial encounter: Secondary | ICD-10-CM

## 2017-04-25 DIAGNOSIS — O8883 Other embolism in the puerperium: Secondary | ICD-10-CM

## 2017-04-25 DIAGNOSIS — K5901 Slow transit constipation: Secondary | ICD-10-CM | POA: Insufficient documentation

## 2017-04-25 DIAGNOSIS — Z79899 Other long term (current) drug therapy: Secondary | ICD-10-CM | POA: Insufficient documentation

## 2017-04-25 DIAGNOSIS — O8600 Infection of obstetric surgical wound, unspecified: Secondary | ICD-10-CM | POA: Insufficient documentation

## 2017-04-25 DIAGNOSIS — Z9851 Tubal ligation status: Secondary | ICD-10-CM | POA: Insufficient documentation

## 2017-04-25 DIAGNOSIS — O8823 Thromboembolism in the puerperium: Secondary | ICD-10-CM | POA: Insufficient documentation

## 2017-04-25 DIAGNOSIS — Z7901 Long term (current) use of anticoagulants: Secondary | ICD-10-CM | POA: Insufficient documentation

## 2017-04-25 DIAGNOSIS — R51 Headache: Secondary | ICD-10-CM | POA: Insufficient documentation

## 2017-04-25 HISTORY — DX: Thromboembolism in pregnancy, unspecified trimester: O88.219

## 2017-04-25 MED ORDER — ACETAMINOPHEN 325 MG PO TABS
650.00 | ORAL_TABLET | ORAL | Status: DC
Start: ? — End: 2017-04-25

## 2017-04-25 MED ORDER — APIXABAN 5 MG PO TABS
10.00 | ORAL_TABLET | ORAL | Status: DC
Start: 2017-04-25 — End: 2017-04-25

## 2017-04-25 MED ORDER — PROMETHAZINE HCL 25 MG PO TABS
12.50 | ORAL_TABLET | ORAL | Status: DC
Start: ? — End: 2017-04-25

## 2017-04-25 MED ORDER — IRON PO
1.00 | ORAL | Status: DC
Start: 2017-04-26 — End: 2017-04-25

## 2017-04-25 MED ORDER — DOCUSATE SODIUM 100 MG PO CAPS
100.00 | ORAL_CAPSULE | ORAL | Status: DC
Start: ? — End: 2017-04-25

## 2017-04-25 NOTE — MAU Note (Addendum)
Pt states she had a c/s on 04/19/2017. States [place] in [place] and was told she had a blood clot on right lung. States she was discharged this morning. States she was placed on a blood thinner. States she has continued SOB and sharp pains in head. This is not any different than when [place] was [place]. Also reports some dizziness

## 2017-04-25 NOTE — Telephone Encounter (Signed)
Patient contacted the office with questions. She had a repeat c-section on 04/19/2017 and discharge on 04/21/2017. [place] and diagnosed with a possible PE (CT angio limited due to artifact and negative lower extremity dopplers bilaterally) and is being anticoagulated.  Both numbers on file [phone] and [phone]) are no longer in service. Left a voicemail with emergency contact [phone] to have patient call back the office with a valid number in order for us to provide assistance.

## 2017-04-25 NOTE — Telephone Encounter (Signed)
Please call patient at the ([phone] area code number saved in note patient is requesting a call back she states the matter is urgent.please advise.Marland Kitchen..Marland Kitchen

## 2017-04-26 ENCOUNTER — Encounter (HOSPITAL_COMMUNITY): Payer: Self-pay | Admitting: *Deleted

## 2017-04-26 DIAGNOSIS — O8823 Thromboembolism in the puerperium: Secondary | ICD-10-CM | POA: Diagnosis not present

## 2017-04-26 DIAGNOSIS — O9089 Other complications of the puerperium, not elsewhere classified: Secondary | ICD-10-CM

## 2017-04-26 DIAGNOSIS — R51 Headache: Secondary | ICD-10-CM | POA: Diagnosis not present

## 2017-04-26 DIAGNOSIS — K5901 Slow transit constipation: Secondary | ICD-10-CM | POA: Diagnosis not present

## 2017-04-26 DIAGNOSIS — Z87891 Personal history of nicotine dependence: Secondary | ICD-10-CM | POA: Diagnosis not present

## 2017-04-26 DIAGNOSIS — O8883 Other embolism in the puerperium: Secondary | ICD-10-CM | POA: Diagnosis not present

## 2017-04-26 DIAGNOSIS — Z7901 Long term (current) use of anticoagulants: Secondary | ICD-10-CM | POA: Diagnosis not present

## 2017-04-26 DIAGNOSIS — Z9851 Tubal ligation status: Secondary | ICD-10-CM | POA: Diagnosis not present

## 2017-04-26 DIAGNOSIS — O8600 Infection of obstetric surgical wound, unspecified: Secondary | ICD-10-CM | POA: Diagnosis not present

## 2017-04-26 DIAGNOSIS — Z79899 Other long term (current) drug therapy: Secondary | ICD-10-CM | POA: Diagnosis not present

## 2017-04-26 LAB — CBC
HCT: 34.2 % — ABNORMAL LOW (ref 36.0–46.0)
HEMOGLOBIN: 11.5 g/dL — AB (ref 12.0–15.0)
MCH: 29.5 pg (ref 26.0–34.0)
MCHC: 33.6 g/dL (ref 30.0–36.0)
MCV: 87.7 fL (ref 78.0–100.0)
Platelets: 398 10*3/uL (ref 150–400)
RBC: 3.9 MIL/uL (ref 3.87–5.11)
RDW: 13.6 % (ref 11.5–15.5)
WBC: 9 10*3/uL (ref 4.0–10.5)

## 2017-04-26 LAB — COMPREHENSIVE METABOLIC PANEL
ALBUMIN: 2.9 g/dL — AB (ref 3.5–5.0)
ALK PHOS: 122 U/L (ref 38–126)
ALT: 41 U/L (ref 14–54)
AST: 56 U/L — ABNORMAL HIGH (ref 15–41)
Anion gap: 11 (ref 5–15)
BUN: 21 mg/dL — ABNORMAL HIGH (ref 6–20)
CALCIUM: 9.2 mg/dL (ref 8.9–10.3)
CHLORIDE: 104 mmol/L (ref 101–111)
CO2: 23 mmol/L (ref 22–32)
CREATININE: 0.9 mg/dL (ref 0.44–1.00)
GFR calc Af Amer: >60 mL/min (ref >60)
GFR calc non Af Amer: >60 mL/min (ref >60)
GLUCOSE: 95 mg/dL (ref 65–99)
Potassium: 4.2 mmol/L (ref 3.5–5.1)
SODIUM: 138 mmol/L (ref 135–145)
Total Bilirubin: 0.4 mg/dL (ref 0.3–1.2)
Total Protein: 7.8 g/dL (ref 6.5–8.1)

## 2017-04-26 MED ORDER — DOCUSATE SODIUM 100 MG PO CAPS: 100.0000 mg | ORAL_CAPSULE | Freq: Every day | ORAL | 1 refills | Status: DC | PRN

## 2017-04-26 MED ORDER — BUTALBITAL-APAP-CAFFEINE 50-325-40 MG PO TABS
2.0000 | ORAL_TABLET | Freq: Once | ORAL | Status: AC
  Administered 2017-04-26: 2 via ORAL
  Filled 2017-04-26: qty 2

## 2017-04-26 MED ORDER — POLYETHYLENE GLYCOL 3350 17 GM/SCOOP PO POWD: 17.0000 g | Freq: Every day | ORAL | 0 refills | Status: DC

## 2017-04-26 MED ORDER — BUTALBITAL-APAP-CAFFEINE 50-325-40 MG PO TABS: 1.0000 | ORAL_TABLET | Freq: Four times a day (QID) | ORAL | 0 refills | Status: DC | PRN

## 2017-04-26 MED ORDER — CEPHALEXIN 500 MG PO CAPS: 500.0000 mg | ORAL_CAPSULE | Freq: Two times a day (BID) | ORAL | 0 refills | Status: AC

## 2017-04-26 NOTE — Discharge Instructions (Signed)
Constipation, Adult Constipation is when a person:  Poops (has a bowel movement) fewer times in a week than normal.  Has a hard time pooping.  Has poop that is dry, hard, or bigger than normal.  Follow these instructions at home: Eating and drinking   Eat foods that have a lot of fiber, such as: ? Fresh fruits and vegetables. ? Whole grains. ? Beans.  Eat less of foods that are high in fat, low in fiber, or overly processed, such as: ? [place] fries. ? Hamburgers. ? Cookies. ? Candy. ? Soda.  Drink enough fluid to keep your pee (urine) clear or pale yellow. General instructions  Exercise regularly or as told by your doctor.  Go to the restroom when you feel like you need to poop. Do not hold it in.  Take over-the-counter and prescription medicines only as told by your doctor. These include any fiber supplements.  Do pelvic floor retraining exercises, such as: ? Doing deep breathing while relaxing your lower belly (abdomen). ? Relaxing your pelvic floor while pooping.  Watch your condition for any changes.  Keep all follow-up visits as told by your doctor. This is important. Contact a doctor if:  You have pain that gets worse.  You have a fever.  You have not pooped for 4 days.  You throw up (vomit).  You are not hungry.  You lose weight.  You are bleeding from the anus.  You have thin, pencil-like poop (stool). Get help right away if:  You have a fever, and your symptoms suddenly get worse.  You leak poop or have blood in your poop.  Your belly feels hard or bigger than normal (is bloated).  You have very bad belly pain.  You feel dizzy or you faint. This information is not intended to replace advice given to you by your health care provider. Make sure you discuss any questions you have with your health care provider. Document Released: 09/12/2007 Document Revised: 10/14/2015 Document Reviewed: 09/14/2015 Elsevier Interactive Patient Education   2018 [employer]   General Headache Without Cause A headache is pain or discomfort felt around the head or neck area. There are many causes and types of headaches. In some cases, the cause may not be found. Follow these instructions at home: Managing pain  Take over-the-counter and prescription medicines only as told by your doctor.  Lie down in a dark, quiet room when you have a headache.  If directed, apply ice to the head and neck area: ? Put ice in a plastic bag. ? Place a towel between your skin and the bag. ? Leave the ice on for 20 minutes, 2-3 times per day.  Use a heating pad or hot shower to apply heat to the head and neck area as told by your doctor.  Keep lights dim if bright lights bother you or make your headaches worse. Eating and drinking  Eat meals on a regular schedule.  Lessen how much alcohol you drink.  Lessen how much caffeine you drink, or stop drinking caffeine. General instructions  Keep all follow-up visits as told by your doctor. This is important.  Keep a journal to find out if certain things bring on headaches. For example, write down: ? What you eat and drink. ? How much sleep you get. ? Any change to your diet or medicines.  Relax by getting a massage or doing other relaxing activities.  Lessen stress.  Sit up straight. Do not tighten (tense) your muscles.  Do not use tobacco products. This includes cigarettes, chewing tobacco, or e-cigarettes. If you need help quitting, ask your doctor.  Exercise regularly as told by your doctor.  Get enough sleep. This often means 7-9 hours of sleep. Contact a doctor if:  Your symptoms are not helped by medicine.  You have a headache that feels different than the other headaches.  You feel sick to your stomach (nauseous) or you throw up (vomit).  You have a fever. Get help right away if:  Your headache becomes really bad.  You keep throwing up.  You have a stiff neck.  You have trouble  seeing.  You have trouble speaking.  You have pain in the eye or ear.  Your muscles are weak or you lose muscle control.  You lose your balance or have trouble walking.  You feel like you will pass out (faint) or you pass out.  You have confusion. This information is not intended to replace advice given to you by your health care provider. Make sure you discuss any questions you have with your health care provider. Document Released: 01/03/2008 Document Revised: 09/01/2015 Document Reviewed: 07/19/2014 Elsevier Interactive Patient Education  [employer].

## 2017-04-26 NOTE — MAU Provider Note (Signed)
History  CSN: [id number] Arrival date and time: 04/25/17 2338  First Provider Initiated Contact with Patient 04/26/17 0034      Chief Complaint  Patient presents with  . Postpartum Complications  . Shortness of Breath    HPI: [name] is a 27 y.o. [phone] [id number] now 7 days postpartum s/p LTCS/BTL, and who was diagnosed with pulmonary embolism 3 days (in [place], [place]), who presents to maternity admissions reporting SOB, headache, and incisional pain. When asked what brought her to MAU tonight, she reports that she is just not feeling well, and the fact that she has had unrelieved headache and the incision pain. Headache has been unrelieved with OTC Tylenol. Denies visual disturbances, or focal deficits. Incision pain is on the left side of the incision and just started in the last couple of days; has not noticed redness or drainage. Also denies fever of chills.   In terms of PE and SOB, she was seen at [facility] in [place], [place] 3 days ago for SOB and chest pain, and dx with pulmonary embolism, she was admitted, started on IV heparin, and discharged about 12 hours on Eliquis. She reports that she still feels somewhat SOB, but overall this is improved. Denies chest pain or other new symptoms.  She report taking Eliquis as prescribed.   Vaginal bleeding has decreased significantly, and reports she is mostly seeing blood when she urinates. She reports concern about constipation and dark stools.   OB History  Gravida Para Term Preterm AB Living  5 2 1 1 3 3   SAB TAB Ectopic Multiple Live Births  2 1   1 3     # Outcome Date GA Lbr Len/2nd Weight Sex Delivery Anes PTL Lv  5 Term 04/19/17 9474w0d  7 lb 11.8 oz (3.51 kg) M CS-LTranv Spinal  LIV  4 SAB 07/2013 2170w0d         3 TAB 03/13/12 4751w0d         2 SAB 01/08/12 5627w0d         1A Preterm 05/17/10 4937w0d   F CS-LTranv Spinal Y LIV     Complications: Preeclampsia     Birth Comments: PIH  1B Preterm 05/17/10 6637w0d   M  Spinal Y LIV      Complications: Preeclampsia    Obstetric Comments  D&C   Past Medical History:  Diagnosis Date  . Bacterial vaginosis 08/15/2016  . Headache(784.0)   . Hypertension   . Incompetent cervix   . Pregnancy induced hypertension   . Preterm labor   . Pulmonary embolism during pregnancy, antepartum   . Urinary tract infection    Past Surgical History:  Procedure Laterality Date  . BREAST FIBROADENOMA SURGERY  2006-7.   Removal;right  . CESAREAN SECTION    . CESAREAN SECTION WITH BILATERAL TUBAL LIGATION Bilateral 04/19/2017   Procedure: CESAREAN SECTION WITH BILATERAL TUBAL LIGATION;  Surgeon: Lesly DukesLeggett, Kelly H, MD;  [facility];  Service: Obstetrics;  Laterality: Bilateral;  . DILATION AND CURETTAGE OF UTERUS    . THERAPEUTIC ABORTION    . TUBAL LIGATION     Social History   Socioeconomic History  . Marital status: Single    Spouse name: Not on file  . Number of children: Not on file  . Years of education: Not on file  . Highest education level: Not on file  Social Needs  . Financial resource strain: Not on file  . Food insecurity - worry: Not on file  .  Food insecurity - inability: Not on file  . Transportation needs - medical: Not on file  . Transportation needs - non-medical: Not on file  Occupational History  . Occupation: None    Employer: UNEMPLOYED  Tobacco Use  . Smoking status: Former Smoker    Packs/day: 0.25    Types: Cigarettes    Last attempt to quit: 08/17/2016    Years since quitting: 0.6  . Smokeless tobacco: Former Engineer, water and Sexual Activity  . Alcohol use: No    Alcohol/week: 0.0 oz  . Drug use: No    Comment: last use begin of preg  . Sexual activity: Yes    Birth control/protection: None  Other Topics Concern  . Not on file  Social History Narrative  . Not on file   No Known Allergies  Medications Prior to Admission  Medication Sig Dispense Refill Last Dose  . acetaminophen (TYLENOL) 500 MG tablet Take 1,000 mg by  mouth every 6 (six) hours as needed for mild pain or headache.    More than a month at Unknown time  . amLODipine (NORVASC) 5 MG tablet Take 1 tablet (5 mg total) by mouth daily. 14 tablet 0   . ferrous sulfate 325 (65 FE) MG tablet Take 1 tablet (325 mg total) by mouth 2 (two) times daily with a meal. 30 tablet 3     I have reviewed patient's Past Medical Hx, Surgical Hx, Family Hx, Social Hx, medications and allergies.   Review of Systems: Negative except for what is mentioned in HPI.  Physical Exam   Patient Vitals for the past 4 hrs:  BP Temp Pulse Resp SpO2 Height Weight  04/26/17 0118 114/88 - 75 18 - - -  04/25/17 2355 (!) 97/57 (!) 97.5 F (36.4 C) 87 18 99 % 5\' 4"  (1.626 m) 239 lb (108.4 kg)   Constitutional: Well-developed, well-nourished female in no acute distress.  HENT: [place]/AT, normal oropharynx mucosa. MMM Eyes: normal conjunctivae, no scleral icterus Cardiovascular: normal rate, regular rhythm, no murmrus Respiratory: normal effort, lungs CTAB, no wheezes, rales or ronchi. GI: Abd soft, non-tender, non-distended, normoactive bowel sounds GU: Neg CVAT. MSK: Extremities nontender, no edema Neurologic: Alert and oriented x 4. Psych: Normal mood and affect Skin: warm and dry; LTCS incision scar with steristrips in place, there is very small amount of crusty drainage on left edge of incision without odor, erythema or increase in warmth; there is moderate TTP at left edge of incision, but not elsewhere.    MAU Course/MDM:   Nursing notes and VS reviewed. Patient seen and examined, as noted above.  I reviewed records from hospitalization at [facility] on EHR through [facility].   Recent dx of PE, on Eliquis. She has no respiratory distress here, VSS, and O2s sat is 100% on room air.   She did have elevated BP postpartum, but BP here wnl.   Ordered: - Fiorecet - Check CBC, CMP. Her last hgb was 9.8, report she is not taking iron, but has black  stools.  Results reviewed:  Results for orders placed or performed during the hospital encounter of 04/25/17  CBC  Result Value Ref Range   WBC 9.0 4.0 - 10.5 K/uL   RBC 3.90 3.87 - 5.11 MIL/uL   Hemoglobin 11.5 (L) 12.0 - 15.0 g/dL   HCT 40.9 (L) 81.1 - 91.4 %   MCV 87.7 78.0 - 100.0 fL   MCH 29.5 26.0 - 34.0 pg   MCHC 33.6 30.0 -  36.0 g/dL   RDW 08.6 57.8 - 46.9 %   Platelets 398 150 - 400 K/uL  Comprehensive metabolic panel  Result Value Ref Range   Sodium 138 135 - 145 mmol/L   Potassium 4.2 3.5 - 5.1 mmol/L   Chloride 104 101 - 111 mmol/L   CO2 23 22 - 32 mmol/L   Glucose, Bld 95 65 - 99 mg/dL   BUN 21 (H) 6 - 20 mg/dL   Creatinine, Ser 6.29 0.44 - 1.00 mg/dL   Calcium 9.2 8.9 - 52.8 mg/dL   Total Protein 7.8 6.5 - 8.1 g/dL   Albumin 2.9 (L) 3.5 - 5.0 g/dL   AST 56 (H) 15 - 41 U/L   ALT 41 14 - 54 U/L   Alkaline Phosphatase 122 38 - 126 U/L   Total Bilirubin 0.4 0.3 - 1.2 mg/dL   GFR calc non Af Amer >60 >60 mL/min   GFR calc Af Amer >60 >60 mL/min   Anion gap 11 5 - 15    Labs show normal CMP, other than mildly elevated AST (56 from 30 3 days ago at outside hospital) CBC with Hgb 11.5 from 9.8  HA improved with Fioricet.  Pt's VS remain stable.  Assessment and Plan  Assessment: 1. Pregnancy headache, postpartum   2. Incisional infection   3. Obstetrical pulmonary embolism, postpartum   4. Slow transit constipation     Plan: --Rx: Fioricet prn HA. Counseled on HA preventive measures, and increased water intake --Rx for Keflex given concern for likely early incisional infection --Rx for colace and Miralax for constipation. Advised to stop po prion given Hgb is back up to 11.5. Encouraged just iron rich foods.  --Advised to continue Eliquis for PE as prescribed --Discharge home in stable condition.  --Close follow up. Will send message to [facility] admin pool for f/u w/in 1 week.  [name], Breanna Nicolas, MD 04/26/2017 12:56 AM

## 2017-04-30 ENCOUNTER — Ambulatory Visit: Payer: Medicaid Other | Admitting: Certified Nurse Midwife

## 2017-04-30 ENCOUNTER — Encounter: Payer: Self-pay | Admitting: Certified Nurse Midwife

## 2017-04-30 VITALS — BP 115/79 | HR 93 | Wt 240.0 lb

## 2017-04-30 DIAGNOSIS — Z98891 History of uterine scar from previous surgery: Secondary | ICD-10-CM

## 2017-04-30 DIAGNOSIS — Z86711 Personal history of pulmonary embolism: Secondary | ICD-10-CM

## 2017-04-30 NOTE — Progress Notes (Signed)
RGYN patient presents for F/U incision check today. Pt had C-section  (LTSC) on 04/19/17 Discharged 04/21/17.  Pt also had a Dx on .04/23/17 w/PE was seen in [place]  Still havng headaches.  Currently taking Eliquis

## 2017-04-30 NOTE — Progress Notes (Signed)
Patient ID: [name], female   DOB: 1990-09-04, 27 y.o.   MRN: [id number]  Chief Complaint  Patient presents with  . Wound Check    HPI [name] is a 27 y.o. female.  Here for post-op incision check.  Had repeat LTCS with BTL on 04/19/17.  Travelled to [place], [place] and was diagnosed with PE with questionable CT imaging 04/23/17-04/25/17. Was visiting family. Is taking Eloquis.  Was at [facility] for 2 days had IV Heparin for 12 hours and started on Eloquis.  Is taking her Eloquis BID.  Risks of blood clots reviewed.  States that she will stay compliant with Eloquis therapy.  Discussed POC with Dr. Alysia Penna.  Symptoms that she had were: SOB with blackout spell, decreased hearing acuity, dizziness back/chest pain. Pulmonology consult requested for management of PE.    Is bottle feeding infant.  Denies any depression symptoms.  Living with FOB currently.  Does not have family around to help her.  Seems to be coping well.   HPI  Past Medical History:  Diagnosis Date  . Bacterial vaginosis 08/15/2016  . Headache(784.0)   . Hypertension   . Incompetent cervix   . Pregnancy induced hypertension   . Preterm labor   . Pulmonary embolism during pregnancy, antepartum   . Urinary tract infection     Past Surgical History:  Procedure Laterality Date  . BREAST FIBROADENOMA SURGERY  2006-7.   Removal;right  . CESAREAN SECTION    . CESAREAN SECTION WITH BILATERAL TUBAL LIGATION Bilateral 04/19/2017   Procedure: CESAREAN SECTION WITH BILATERAL TUBAL LIGATION;  Surgeon: Lesly Dukes, MD;  Location: [facility] BIRTHING SUITES;  Service: Obstetrics;  Laterality: Bilateral;  . DILATION AND CURETTAGE OF UTERUS    . THERAPEUTIC ABORTION    . TUBAL LIGATION      Family History  Problem Relation Age of Onset  . Diabetes Mother   . Diabetes Maternal Grandmother   . Hypertension Maternal Grandmother   . Cancer Maternal Grandmother        colon cancer, brain tumour  . Other Neg Hx      Social History Social History   Tobacco Use  . Smoking status: Former Smoker    Packs/day: 0.25    Types: Cigarettes    Last attempt to quit: 08/17/2016    Years since quitting: 0.7  . Smokeless tobacco: Former Engineer, water Use Topics  . Alcohol use: No    Alcohol/week: 0.0 oz  . Drug use: No    Comment: last use begin of preg    No Known Allergies  Current Outpatient Medications  Medication Sig Dispense Refill  . butalbital-acetaminophen-caffeine (FIORICET, ESGIC) 50-325-40 MG tablet Take 1-2 tablets by mouth every 6 (six) hours as needed for headache. 20 tablet 0  . cephALEXin (KEFLEX) 500 MG capsule Take 1 capsule (500 mg total) by mouth 2 (two) times daily for 10 days. 20 capsule 0  . docusate sodium (COLACE) 100 MG capsule Take 1 capsule (100 mg total) by mouth daily as needed. 60 capsule 1  . polyethylene glycol powder (GLYCOLAX/MIRALAX) powder Take 17 g by mouth daily. 500 g 0  . acetaminophen (TYLENOL) 500 MG tablet Take 1,000 mg by mouth every 6 (six) hours as needed for mild pain or headache.     Marland Kitchen amLODipine (NORVASC) 5 MG tablet Take 1 tablet (5 mg total) by mouth daily. (Patient not taking: Reported on 04/30/2017) 14 tablet 0  . ELIQUIS 5 MG TABS tablet  TAKE 2 TABS TWICE DAILY FOR 7 DAYS THEN 1 TAB TWICE DAILY THEREAFTER  0   No current facility-administered medications for this visit.     Review of Systems Review of Systems Constitutional: negative for fatigue and weight loss Respiratory: negative for cough and wheezing Cardiovascular: negative for chest pain, fatigue and palpitations, recent hx of PE Gastrointestinal: negative for abdominal pain and change in bowel habits Genitourinary:negative Integument/breast: negative for nipple discharge Musculoskeletal:negative for myalgias Neurological: negative for gait problems and tremors Behavioral/Psych: negative for abusive relationship, depression Endocrine: negative for temperature intolerance       Blood pressure 115/79, pulse 93, weight 240 lb (108.9 kg), not currently breastfeeding.  Physical Exam Physical Exam General:   alert  Skin:   no rash or abnormalities  Lungs:   clear to auscultation bilaterally  Heart:   regular rate and rhythm, S1, S2 normal, no murmur, click, rub or gallop  Breasts:   deferred  Abdomen:  normal findings: no organomegaly, soft, non-tender and no hernia Obese.  C-section wound: edges well approximated, healing, no s/s infection noted, C/D/I, no puss or open areas noted.   Pelvis:  deferred    50% of 20 min visit spent on counseling and coordination of care.   Data Reviewed Previous medical hx, meds, CT scan  Assessment     Recent PE [facility] S/P C-section with BTL: healing    Plan    Orders Placed This Encounter  Procedures  . Ambulatory referral to Pulmonology    Referral Priority:   Routine    Referral Type:   Consultation    Referral Reason:   Specialty Services Required    Requested Specialty:   Pulmonary Disease    Number of Visits Requested:   1    Follow up 4 weeks postpartum exam.

## 2017-05-01 ENCOUNTER — Other Ambulatory Visit: Payer: Self-pay | Admitting: Family Medicine

## 2017-05-03 ENCOUNTER — Inpatient Hospital Stay (HOSPITAL_COMMUNITY)
Admission: AD | Admit: 2017-05-03 | Discharge: 2017-05-04 | Disposition: A | Discharge status: Home / Self Care | Payer: Medicaid Other | Source: Ambulatory Visit | Attending: Obstetrics and Gynecology | Admitting: Obstetrics and Gynecology

## 2017-05-03 ENCOUNTER — Ambulatory Visit: Payer: Medicaid Other | Admitting: Certified Nurse Midwife

## 2017-05-03 ENCOUNTER — Encounter (HOSPITAL_COMMUNITY): Payer: Self-pay | Admitting: *Deleted

## 2017-05-03 ENCOUNTER — Other Ambulatory Visit: Payer: Self-pay

## 2017-05-03 DIAGNOSIS — G43019 Migraine without aura, intractable, without status migrainosus: Secondary | ICD-10-CM

## 2017-05-03 DIAGNOSIS — G43909 Migraine, unspecified, not intractable, without status migrainosus: Secondary | ICD-10-CM | POA: Insufficient documentation

## 2017-05-03 DIAGNOSIS — Z87891 Personal history of nicotine dependence: Secondary | ICD-10-CM | POA: Diagnosis not present

## 2017-05-03 DIAGNOSIS — Z86711 Personal history of pulmonary embolism: Secondary | ICD-10-CM | POA: Diagnosis present

## 2017-05-03 DIAGNOSIS — O9089 Other complications of the puerperium, not elsewhere classified: Secondary | ICD-10-CM | POA: Insufficient documentation

## 2017-05-03 MED ORDER — METOCLOPRAMIDE HCL 5 MG/ML IJ SOLN
10.0000 mg | Freq: Three times a day (TID) | INTRAMUSCULAR | Status: DC
  Administered 2017-05-04: 10 mg via INTRAVENOUS
  Filled 2017-05-03: qty 2

## 2017-05-03 MED ORDER — KETOROLAC TROMETHAMINE 30 MG/ML IJ SOLN
30.0000 mg | Freq: Three times a day (TID) | INTRAMUSCULAR | Status: DC
  Administered 2017-05-03: 30 mg via INTRAVENOUS
  Filled 2017-05-03: qty 1

## 2017-05-03 MED ORDER — SODIUM CHLORIDE 0.9 % IV SOLN
INTRAVENOUS | Status: DC
  Administered 2017-05-03: via INTRAVENOUS

## 2017-05-03 MED ORDER — DIPHENHYDRAMINE HCL 50 MG/ML IJ SOLN
25.0000 mg | Freq: Four times a day (QID) | INTRAMUSCULAR | Status: DC | PRN
  Administered 2017-05-03: 25 mg via INTRAVENOUS
  Filled 2017-05-03: qty 1

## 2017-05-03 NOTE — MAU Note (Signed)
Have blood clot in R lung. For 3 days have been having pain in R side. Hard to swallow water and food. Keep getting headaches since having c/s and finding out about blood clot

## 2017-05-04 ENCOUNTER — Inpatient Hospital Stay (HOSPITAL_COMMUNITY): Payer: Medicaid Other

## 2017-05-04 DIAGNOSIS — G43019 Migraine without aura, intractable, without status migrainosus: Secondary | ICD-10-CM | POA: Diagnosis not present

## 2017-05-04 NOTE — MAU Provider Note (Signed)
History     CSN: [id number]  Arrival date and time: 05/03/17 2227   None     Chief Complaint  Patient presents with  . pulmonary embolus  . Headache   HPI  [name] is a 27 y.o. [id number] now 15 days postpartum s/p LTCS/BTL, and who was diagnosed with pulmonary embolism in [place], [place] at [facility] on 04/23/17 with 12 hours of IV heparin and put on Eloquis with subsequent hosptial admission for two days.  Currently presenting to maternity admissions reporting SOB, and headache. When asked what brought her to MAU tonight, she reports that she is just not feeling well, and the fact that she has had unrelieved headache and the right sided chest pain. Headache has been unrelieved with OTC Tylenol or Fioricet. Denies visual disturbances, or focal deficits.  Also denies fever of chills. She reports taking Eliquis as prescribed. Was seen for f/u in the office on 04/30/17, normotensive.  Denies any problems with C-section wound pain currently.       Past Medical History:  Diagnosis Date  . Bacterial vaginosis 08/15/2016  . Headache(784.0)   . Hypertension   . Incompetent cervix   . Pregnancy induced hypertension   . Preterm labor   . Pulmonary embolism during pregnancy, antepartum   . Urinary tract infection     Past Surgical History:  Procedure Laterality Date  . BREAST FIBROADENOMA SURGERY  2006-7.   Removal;right  . CESAREAN SECTION    . CESAREAN SECTION WITH BILATERAL TUBAL LIGATION Bilateral 04/19/2017   Procedure: CESAREAN SECTION WITH BILATERAL TUBAL LIGATION;  Surgeon: Lesly Dukes, MD;  Location: [facility] BIRTHING SUIT[facility];  Service: Obstetrics;  Laterality: Bilateral;  . DILATION AND CURETTAGE OF UTERUS    . THERAPEUTIC ABORTION    . TUBAL LIGATION      Family History  Problem Relation Age of Onset  . Diabetes Mother   . Diabetes Maternal Grandmother   . Hypertension Maternal Grandmother   . Cancer Maternal Grandmother        colon cancer, brain tumour   . Other Neg Hx     Social History   Tobacco Use  . Smoking status: Former Smoker    Packs/day: 0.25    Types: Cigarettes    Last attempt to quit: 08/17/2016    Years since quitting: 0.7  . Smokeless tobacco: Former Engineer, water Use Topics  . Alcohol use: No    Alcohol/week: 0.0 oz  . Drug use: No    Comment: last use begin of preg    Allergies: No Known Allergies  Medications Prior to Admission  Medication Sig Dispense Refill Last Dose  . cephALEXin (KEFLEX) 500 MG capsule Take 1 capsule (500 mg total) by mouth 2 (two) times daily for 10 days. 20 capsule 0 05/03/2017 at Unknown time  . docusate sodium (COLACE) 100 MG capsule Take 1 capsule (100 mg total) by mouth daily as needed. 60 capsule 1 05/03/2017 at Unknown time  . acetaminophen (TYLENOL) 500 MG tablet Take 1,000 mg by mouth every 6 (six) hours as needed for mild pain or headache.    Not Taking  . amLODipine (NORVASC) 5 MG tablet Take 1 tablet (5 mg total) by mouth daily. (Patient not taking: Reported on 04/30/2017) 14 tablet 0 Not Taking  . butalbital-acetaminophen-caffeine (FIORICET, ESGIC) 50-325-40 MG tablet Take 1-2 tablets by mouth every 6 (six) hours as needed for headache. 20 tablet 0 Taking  . ELIQUIS 5 MG TABS tablet pt states  is taking 2 tabs BID and then will start 1 bid in February  0   . polyethylene glycol powder (GLYCOLAX/MIRALAX) powder Take 17 g by mouth daily. 500 g 0 Taking    Review of Systems Physical Exam   Blood pressure 126/77, pulse 93, temperature 98.6 F (37 C), resp. rate 18, SpO2 100 %, not currently breastfeeding.  Physical Exam Constitutional: Well-developed, well-nourished female in no acute distress.  HENT: [place]/AT, normal oropharynx mucosa. MMM  Eyes: normal conjunctivae, no scleral icterus  Cardiovascular: normal rate, regular rhythm, no murmrus  Respiratory: normal effort, lungs CTAB, no wheezes, rales or ronchi.  GI: Abd soft, non-tender, non-distended, normoactive bowel sounds   GU: Neg CVAT.  MSK: Extremities nontender, no edema  Neurologic: Alert and oriented x 4.  Psych: Normal mood and affect  Skin: warm and dry; LTCS incision scar healing, no s/s infection noted, C/D/I, incision edges well approximated, non tender to palpation.  MAU Course  Procedures  MDM Nursing notes and VS reviewed. Patient seen and examined, as noted above.  I reviewed [facility].   Recent dx of PE, on Eliquis. She has no respiratory distress here, VSS, and O2s sat is 100% on room air.   She did have elevated BP postpartum, but BP here wnl, not taking Norvasc.   Ordered: CT: WNL Migraine cocktail & IVF  CLINICAL DATA:  Shortness of breath, right-sided chest pain  EXAM: CT ANGIOGRAPHY CHEST WITH CONTRAST  TECHNIQUE: Multidetector CT imaging of the chest was performed using the standard protocol during bolus administration of intravenous contrast. Multiplanar CT image reconstructions and MIPs were obtained to evaluate the vascular anatomy.  CONTRAST:  100 mL Isovue 370 intravenous  COMPARISON:  07/26/2015  FINDINGS: Cardiovascular: Satisfactory opacification of the pulmonary arteries to the segmental level. Mild respiratory motion artifact limits evaluation of lower lobe pulmonary artery branches. No definite acute embolus is seen. Nonaneurysmal aorta. No dissection. Normal heart size. No pericardial effusion.  Mediastinum/Nodes: No enlarged mediastinal, hilar, or axillary lymph nodes. Thyroid gland, trachea, and esophagus demonstrate no significant findings.  Lungs/Pleura: Lungs are clear. No pleural effusion or pneumothorax.  Upper Abdomen: No acute abnormality.  Musculoskeletal: No chest wall abnormality. No acute or significant osseous findings.  Review of the MIP images confirms the above findings.  IMPRESSION: 1. Negative for acute pulmonary embolus or aortic  dissection 2. Clear lung fields   Electronically Signed   By: Jasmine PangKim  Fujinaga M.D.   On: 05/04/2017 02:36  Assessment and Plan  Migraine type HA resolved with cocktail No acute distress CT scan normal: continue Eloquis as previously prescribed.  S/P RLTCS with BTL --Continue Fioricet for HA --Finish Keflex rx previously prescribed --Discharge home in stable condition --Has F/U appointment scheduled  --Has Pulmonology referral in process for management of hx of PE  Roe CoombsRachelle A Obelia Bonello, CNM 05/04/2017, 2:09 AM

## 2017-05-04 NOTE — Discharge Instructions (Signed)

## 2017-05-13 ENCOUNTER — Telehealth: Payer: Self-pay

## 2017-05-13 ENCOUNTER — Other Ambulatory Visit: Payer: Self-pay | Admitting: Obstetrics and Gynecology

## 2017-05-13 MED ORDER — ELIQUIS 5 MG PO TABS: 5.0000 mg | ORAL_TABLET | Freq: Every day | ORAL | 4 refills | Status: DC

## 2017-05-13 NOTE — Telephone Encounter (Signed)
Patient walked in the office today  States she is out of her blood thinners Pt states she has not f/u with a PCP I consulted w/ Dr.Constant Rx sent  And pt advised to contact [facility]. Pt voiced her understanding and stated she will make appt w/ her PCP.

## 2017-05-16 ENCOUNTER — Encounter: Payer: Self-pay | Admitting: Obstetrics & Gynecology

## 2017-05-16 ENCOUNTER — Ambulatory Visit (INDEPENDENT_AMBULATORY_CARE_PROVIDER_SITE_OTHER): Payer: Medicaid Other | Admitting: Obstetrics & Gynecology

## 2017-05-16 DIAGNOSIS — Z1389 Encounter for screening for other disorder: Secondary | ICD-10-CM

## 2017-05-16 NOTE — Patient Instructions (Signed)
Return to clinic for any scheduled appointments or for any gynecologic concerns as needed.   

## 2017-05-16 NOTE — Progress Notes (Signed)
Post Partum Exam  Breanna EmeryMary E Moreno is a 27 y.o. [phone] [id number] female who presents for a postpartum visit. She is 4 weeks postpartum following a low cervical transverse cesarean section and BTS. I have fully reviewed the prenatal and intrapartum course. The delivery was at 39 gestational weeks.  Anesthesia: spinal. Postpartum course has been good. Baby's course has been good. Baby is feeding by bottle - Enfamil Neuropro. Bleeding staining only. Bowel function is normal. Bladder function is normal. Patient is sexually active. Contraception method is none. Postpartum depression screening: neg  The following portions of the patient's history were reviewed and updated as appropriate: allergies, current medications, past family history, past medical history, past social history, past surgical history and problem list. Last pap smear done 09/20/2016 and was Normal  Review of Systems Pertinent items noted in HPI and remainder of comprehensive ROS otherwise negative.    Objective:  Blood pressure 129/82, pulse 96, weight 244 lb 14.4 oz (111.1 kg), not currently breastfeeding.  General:  alert and no distress   Breasts:  deferred  Lungs: clear to auscultation bilaterally  Heart:  regular rate and rhythm  Abdomen: soft, non-tender; bowel sounds normal; no masses,  no organomegaly and incision is well-healed, C/Di/I, no erythema  Pelvic:  not evaluated        Assessment:   Normal postpartum exam. Pap smear not done at today's visit.   Plan:   1. Contraception: tubal ligation 2. Follow up as needed.   Jaynie CollinsUGONNA  ANYANWU, MD, FACOG Obstetrician & Gynecologist, [facility] for [facility], [facility]

## 2017-05-20 ENCOUNTER — Emergency Department (HOSPITAL_COMMUNITY): Payer: Medicaid Other

## 2017-05-20 ENCOUNTER — Encounter (HOSPITAL_COMMUNITY): Payer: Self-pay

## 2017-05-20 ENCOUNTER — Emergency Department (HOSPITAL_COMMUNITY)
Admission: EM | Admit: 2017-05-20 | Discharge: 2017-05-20 | Disposition: A | Discharge status: Home / Self Care | Payer: Medicaid Other | Attending: Emergency Medicine | Admitting: Emergency Medicine

## 2017-05-20 DIAGNOSIS — R079 Chest pain, unspecified: Secondary | ICD-10-CM | POA: Insufficient documentation

## 2017-05-20 DIAGNOSIS — N390 Urinary tract infection, site not specified: Secondary | ICD-10-CM

## 2017-05-20 DIAGNOSIS — Z87891 Personal history of nicotine dependence: Secondary | ICD-10-CM | POA: Insufficient documentation

## 2017-05-20 DIAGNOSIS — R0789 Other chest pain: Secondary | ICD-10-CM

## 2017-05-20 DIAGNOSIS — I10 Essential (primary) hypertension: Secondary | ICD-10-CM | POA: Diagnosis not present

## 2017-05-20 DIAGNOSIS — Z79899 Other long term (current) drug therapy: Secondary | ICD-10-CM | POA: Diagnosis not present

## 2017-05-20 DIAGNOSIS — M545 Low back pain: Secondary | ICD-10-CM | POA: Diagnosis present

## 2017-05-20 HISTORY — DX: Other pulmonary embolism without acute cor pulmonale: I26.99

## 2017-05-20 LAB — COMPREHENSIVE METABOLIC PANEL
ALBUMIN: 3.2 g/dL — AB (ref 3.5–5.0)
ALT: 23 U/L (ref 14–54)
ANION GAP: 8 (ref 5–15)
AST: 26 U/L (ref 15–41)
Alkaline Phosphatase: 115 U/L (ref 38–126)
BUN: 8 mg/dL (ref 6–20)
CO2: 26 mmol/L (ref 22–32)
Calcium: 8.7 mg/dL — ABNORMAL LOW (ref 8.9–10.3)
Chloride: 105 mmol/L (ref 101–111)
Creatinine, Ser: 0.78 mg/dL (ref 0.44–1.00)
GFR calc Af Amer: >60 mL/min (ref >60)
GFR calc non Af Amer: >60 mL/min (ref >60)
GLUCOSE: 84 mg/dL (ref 65–99)
POTASSIUM: 4 mmol/L (ref 3.5–5.1)
SODIUM: 139 mmol/L (ref 135–145)
Total Bilirubin: 0.5 mg/dL (ref 0.3–1.2)
Total Protein: 7.1 g/dL (ref 6.5–8.1)

## 2017-05-20 LAB — CBC WITH DIFFERENTIAL/PLATELET
Basophils Absolute: 0 10*3/uL (ref 0.0–0.1)
Basophils Relative: 0 %
Eosinophils Absolute: 0 10*3/uL (ref 0.0–0.7)
Eosinophils Relative: 1 %
HEMATOCRIT: 35 % — AB (ref 36.0–46.0)
HEMOGLOBIN: 11.5 g/dL — AB (ref 12.0–15.0)
LYMPHS PCT: 31 %
Lymphs Abs: 1.7 10*3/uL (ref 0.7–4.0)
MCH: 29.3 pg (ref 26.0–34.0)
MCHC: 32.9 g/dL (ref 30.0–36.0)
MCV: 89.3 fL (ref 78.0–100.0)
MONO ABS: 0.4 10*3/uL (ref 0.1–1.0)
MONOS PCT: 8 %
NEUTROS ABS: 3.2 10*3/uL (ref 1.7–7.7)
Neutrophils Relative %: 60 %
Platelets: 277 10*3/uL (ref 150–400)
RBC: 3.92 MIL/uL (ref 3.87–5.11)
RDW: 13.5 % (ref 11.5–15.5)
WBC: 5.3 10*3/uL (ref 4.0–10.5)

## 2017-05-20 LAB — I-STAT TROPONIN, ED: Troponin i, poc: 0 ng/mL (ref 0.00–0.08)

## 2017-05-20 LAB — URINALYSIS, ROUTINE W REFLEX MICROSCOPIC
BILIRUBIN URINE: NEGATIVE
GLUCOSE, UA: NEGATIVE mg/dL
KETONES UR: NEGATIVE mg/dL
LEUKOCYTES UA: NEGATIVE
Nitrite: POSITIVE — AB
PROTEIN: NEGATIVE mg/dL
Specific Gravity, Urine: >1.046 — ABNORMAL HIGH (ref 1.005–1.030)
pH: 6 (ref 5.0–8.0)

## 2017-05-20 LAB — BRAIN NATRIURETIC PEPTIDE: B NATRIURETIC PEPTIDE 5: 13.2 pg/mL (ref 0.0–100.0)

## 2017-05-20 LAB — INFLUENZA PANEL BY PCR (TYPE A & B)
Influenza A By PCR: NEGATIVE
Influenza B By PCR: NEGATIVE

## 2017-05-20 MED ORDER — PROMETHAZINE HCL 25 MG PO TABS: 25.0000 mg | ORAL_TABLET | Freq: Three times a day (TID) | ORAL | 0 refills | Status: DC | PRN

## 2017-05-20 MED ORDER — IOPAMIDOL (ISOVUE-370) INJECTION 76%
INTRAVENOUS | Status: AC
  Administered 2017-05-20: 100 mL
  Filled 2017-05-20: qty 100

## 2017-05-20 MED ORDER — BUTALBITAL-APAP-CAFFEINE 50-325-40 MG PO TABS: 1.0000 | ORAL_TABLET | Freq: Four times a day (QID) | ORAL | 0 refills | Status: DC | PRN

## 2017-05-20 MED ORDER — SODIUM CHLORIDE 0.9 % IV BOLUS (SEPSIS)
500.0000 mL | Freq: Once | INTRAVENOUS | Status: AC
  Administered 2017-05-20: 500 mL via INTRAVENOUS

## 2017-05-20 MED ORDER — CEPHALEXIN 500 MG PO CAPS
500.0000 mg | ORAL_CAPSULE | Freq: Once | ORAL | Status: AC
  Administered 2017-05-20: 500 mg via ORAL
  Filled 2017-05-20: qty 1

## 2017-05-20 MED ORDER — KETOROLAC TROMETHAMINE 30 MG/ML IJ SOLN
15.0000 mg | Freq: Once | INTRAMUSCULAR | Status: AC
  Administered 2017-05-20: 15 mg via INTRAVENOUS
  Filled 2017-05-20: qty 1

## 2017-05-20 MED ORDER — ONDANSETRON HCL 4 MG/2ML IJ SOLN
4.0000 mg | Freq: Once | INTRAMUSCULAR | Status: AC
  Administered 2017-05-20: 4 mg via INTRAVENOUS
  Filled 2017-05-20: qty 2

## 2017-05-20 MED ORDER — CEPHALEXIN 500 MG PO CAPS: 500.0000 mg | ORAL_CAPSULE | Freq: Three times a day (TID) | ORAL | 0 refills | Status: DC

## 2017-05-20 NOTE — ED Triage Notes (Signed)
Pt with recent birth 1 month ago. Placed on blood thinners for PE to the right lung. Pt presents with upper center chest pain and  lower back pain that started last night 1900. Frontal Headache that varies with intensity however today has been constant. Pt c/o of dizziness with standing at times. Blurred vision earlier today only with standing however "fine right now".  Denies shortness of breath. C/o generalized weakness started yesterday. N/V/D this am. Vomited twice today. Diarrhea once. Denies fever

## 2017-05-20 NOTE — ED Notes (Signed)
Patient and visitor given graham crackers and pb and coffee

## 2017-05-20 NOTE — ED Notes (Addendum)
Patient adamantly refused flu swab. Provider aware.

## 2017-05-20 NOTE — ED Provider Notes (Signed)
[facility]-EMERGENCY DEPT Provider Note   CSN: [id number] Arrival date & time: 05/20/17  1443     History   Chief Complaint Chief Complaint  Patient presents with  . Chest Pain  . Headache  . Back Pain    HPI [name] is a 27 y.o. female.  The history is provided by the patient and the spouse. No language interpreter was used.  Chest Pain   Associated symptoms include back pain and headaches.  Headache    Back Pain   Associated symptoms include chest pain and headaches.    [name] is a 27 y.o. female who presents to the Emergency Department complaining of multiple complaints.  She presents to the emergency department complaining of chest pain, nausea, vomiting, body aches that began at 716 last night.  She is 1 month postpartum following a cesarean delivery.  Her postpartum course was complicated by a pulmonary embolism that was diagnosed on January 11.  She has been treated with Eliquis since that time.  She states that her chest pain had been improved until last night when she developed sudden onset central and left-sided chest tightness with an irregular and rapid heartbeat.  Her symptoms are worse with laying supine.  No reports of fevers but her husband states that she felt very warm at home.  No diarrhea, leg swelling or pain.  She is having no vaginal bleeding.  She is having some lower back discomfort.  She also reports a generalized headache.  She is not currently breast-feeding.  She did have problems with elevated blood pressure during her pregnancy, which is currently resolved.  No prior similar symptoms.  Past Medical History:  Diagnosis Date  . Bacterial vaginosis 08/15/2016  . Headache(784.0)   . Hypertension   . Incompetent cervix   . Preeclampsia, third trimester 03/21/2017  . Pregnancy induced hypertension   . Preterm labor   . Pulmonary emboli (HCC)    RIGHT LUNG  . Pulmonary embolism during pregnancy, antepartum   . Urinary  tract infection     Patient Active Problem List   Diagnosis Date Noted  . TOBACCO USE, QUIT 01/03/2009  . OBESITY 11/30/2008    Past Surgical History:  Procedure Laterality Date  . BREAST FIBROADENOMA SURGERY  2006-7.   Removal;right  . CESAREAN SECTION    . CESAREAN SECTION WITH BILATERAL TUBAL LIGATION Bilateral 04/19/2017   Procedure: CESAREAN SECTION WITH BILATERAL TUBAL LIGATION;  Surgeon: Lesly Dukes, MD;  Location: [facility] BIRTHING SUITES;  Service: Obstetrics;  Laterality: Bilateral;  . DILATION AND CURETTAGE OF UTERUS    . THERAPEUTIC ABORTION    . TUBAL LIGATION      OB History    Gravida Para Term Preterm AB Living   5 2 1 1 3 3    SAB TAB Ectopic Multiple Live Births   2 1   1 3       Obstetric Comments   D&C       Home Medications    Prior to Admission medications   Medication Sig Start Date End Date Taking? Authorizing Provider  amLODipine (NORVASC) 5 MG tablet Take 1 tablet (5 mg total) by mouth daily. 04/21/17  Yes Sharyon Cable, CNM  ELIQUIS 5 MG TABS tablet Take 1 tablet (5 mg total) by mouth daily. 05/13/17  Yes Constant, Peggy, MD  butalbital-acetaminophen-caffeine (FIORICET, ESGIC) [phone] MG tablet Take 1-2 tablets by mouth every 6 (six) hours as needed for headache. 05/20/17 05/20/18  Madilyn Hook,  Lanora Manis, MD  cephALEXin (KEFLEX) 500 MG capsule Take 1 capsule (500 mg total) by mouth 3 (three) times daily. 05/20/17   Tilden Fossa, MD  promethazine (PHENERGAN) 25 MG tablet Take 1 tablet (25 mg total) by mouth every 8 (eight) hours as needed for nausea or vomiting. 05/20/17   Tilden Fossa, MD    Family History Family History  Problem Relation Age of Onset  . Diabetes Mother   . Diabetes Maternal Grandmother   . Hypertension Maternal Grandmother   . Cancer Maternal Grandmother        colon cancer, brain tumour  . Other Neg Hx     Social History Social History   Tobacco Use  . Smoking status: Former Smoker    Packs/day: 0.25    Types:  Cigarettes    Last attempt to quit: 08/17/2016    Years since quitting: 0.7  . Smokeless tobacco: Former Engineer, water Use Topics  . Alcohol use: No    Alcohol/week: 0.0 oz  . Drug use: No    Comment: last use begin of preg     Allergies   Patient has no known allergies.   Review of Systems Review of Systems  Cardiovascular: Positive for chest pain.  Musculoskeletal: Positive for back pain.  Neurological: Positive for headaches.  All other systems reviewed and are negative.    Physical Exam Updated Vital Signs BP 120/74 (BP Location: Right Arm)   Pulse 88   Temp 98.7 F (37.1 C) (Oral)   Resp 14   Ht 5\' 4"  (1.626 m)   Wt 107.5 kg (237 lb)   SpO2 97%   BMI 40.68 kg/m   Physical Exam  Constitutional: She is oriented to person, place, and time. She appears well-developed and well-nourished.  HENT:  Head: Normocephalic and atraumatic.  Cardiovascular: Normal rate and regular rhythm.  No murmur heard. Pulmonary/Chest: Effort normal and breath sounds normal. No respiratory distress.  Abdominal: Soft. There is no tenderness. There is no rebound and no guarding.  Musculoskeletal: She exhibits no edema or tenderness.  Neurological: She is alert and oriented to person, place, and time.  Skin: Skin is warm and dry.  Psychiatric: She has a normal mood and affect. Her behavior is normal.  Nursing note and vitals reviewed.    ED Treatments / Results  Labs (all labs ordered are listed, but only abnormal results are displayed) Labs Reviewed  COMPREHENSIVE METABOLIC PANEL - Abnormal; Notable for the following components:      Result Value   Calcium 8.7 (*)    Albumin 3.2 (*)    All other components within normal limits  CBC WITH DIFFERENTIAL/PLATELET - Abnormal; Notable for the following components:   Hemoglobin 11.5 (*)    HCT 35.0 (*)    All other components within normal limits  URINALYSIS, ROUTINE W REFLEX MICROSCOPIC - Abnormal; Notable for the following  components:   Specific Gravity, Urine >1.046 (*)    Hgb urine dipstick SMALL (*)    Nitrite POSITIVE (*)    Bacteria, UA RARE (*)    Squamous Epithelial / LPF 0-5 (*)    All other components within normal limits  URINE CULTURE  BRAIN NATRIURETIC PEPTIDE  INFLUENZA PANEL BY PCR (TYPE A & B)  I-STAT TROPONIN, ED    EKG  EKG Interpretation  Date/Time:  Monday May 20 2017 15:22:05 EST Ventricular Rate:  91 PR Interval:    QRS Duration: 91 QT Interval:  334 QTC Calculation: 411 R Axis:  52 Text Interpretation:  Sinus rhythm Borderline T abnormalities, diffuse leads Confirmed by Tilden Fossaees, Madix Blowe [phone]([id number]) on 05/20/2017 4:12:46 PM       Radiology Ct Angio Chest Pe W/cm &/or Wo Cm  Result Date: 05/20/2017 CLINICAL DATA:  Reported history of recent pulmonary embolism, on anticoagulation. Postpartum for 1 month. Patient presents with chest and low back pain since last night. EXAM: CT ANGIOGRAPHY CHEST WITH CONTRAST TECHNIQUE: Multidetector CT imaging of the chest was performed using the standard protocol during bolus administration of intravenous contrast. Multiplanar CT image reconstructions and MIPs were obtained to evaluate the vascular anatomy. CONTRAST:  100mL ISOVUE-370 IOPAMIDOL (ISOVUE-370) INJECTION 76% COMPARISON:  05/04/2017 chest CT angiogram. FINDINGS: Cardiovascular: The study is moderate quality for the evaluation of pulmonary embolism, with some motion degradation limiting image quality. There are no filling defects in the central, lobar, segmental or subsegmental pulmonary artery branches to suggest acute pulmonary embolism. Great vessels are normal in course and caliber. Normal heart size. No significant pericardial fluid/thickening. Mediastinum/Nodes: No discrete thyroid nodules. Unremarkable esophagus. No pathologically enlarged axillary, mediastinal or hilar lymph nodes. Lungs/Pleura: No pneumothorax. No pleural effusion. Subpleural 3 mm apical left upper lobe solid  pulmonary nodule (series 6/image 25), stable since 05/04/2017 chest CT, for which no follow-up is required unless the patient has significant risk factors for lung malignancy. No acute consolidative airspace disease, lung masses or additional significant pulmonary nodules. Upper abdomen: No acute abnormality. Musculoskeletal:  No aggressive appearing focal osseous lesions. Review of the MIP images confirms the above findings. IMPRESSION: Motion degraded scan. No evidence of pulmonary embolism. No active disease in the chest. Electronically Signed   By: Delbert PhenixJason A Poff M.D.   On: 05/20/2017 17:34    Procedures Procedures (including critical care time)  Medications Ordered in ED Medications  sodium chloride 0.9 % bolus 500 mL (0 mLs Intravenous Stopped 05/20/17 1800)  ondansetron (ZOFRAN) injection 4 mg (4 mg Intravenous Given 05/20/17 1617)  iopamidol (ISOVUE-370) 76 % injection (100 mLs  Contrast Given 05/20/17 1712)  ketorolac (TORADOL) 30 MG/ML injection 15 mg (15 mg Intravenous Given 05/20/17 1919)  cephALEXin (KEFLEX) capsule 500 mg (500 mg Oral Given 05/20/17 2103)     Initial Impression / Assessment and Plan / ED Course  I have reviewed the triage vital signs and the nursing notes.  Pertinent labs & imaging results that were available during my care of the patient were reviewed by me and considered in my medical decision making (see chart for details).     Patient 1 month postpartum following a cesarean delivery complicated by pulmonary embolism, currently on Eliquis here for evaluation of chest pain, body aches, vomiting.  CT obtained due to concern for possible recurrent PE, CT is negative for PE.  Labs do demonstrate concentrated urine with positive nitrates, concern for a urinary tract infection.  She was treated with IV fluids as well as oral antibiotics in the emergency department.  She was feeling improved on repeat assessment in the emergency department.  Discussed with patient home care  for a urinary tract infection as well as body aches.  Discussed outpatient follow-up and return precautions.  Final Clinical Impressions(s) / ED Diagnoses   Final diagnoses:  Acute UTI  Atypical chest pain    ED Discharge Orders        Ordered    cephALEXin (KEFLEX) 500 MG capsule  3 times daily     05/20/17 2053    promethazine (PHENERGAN) 25 MG tablet  Every 8 hours PRN  05/20/17 2053    butalbital-acetaminophen-caffeine (FIORICET, ESGIC) 50-325-40 MG tablet  Every 6 hours PRN     05/20/17 2053       Tilden Fossa, MD 05/21/17 [phone]

## 2017-05-20 NOTE — ED Notes (Signed)
ED Provider at bedside. EDP REES 

## 2017-05-20 NOTE — ED Notes (Signed)
PT IS COMPLAINING OF BACK PAIN. BACK PAIN HAS BEEN PRESENT SINCE SPINAL BLOCK DURING RECENT BIRTH OF SON 1 MONTH AGO. NO NEW PAIN. PT'S GAIT TO BATHROOM STEADY. PT DENIES DIZZINESS AND BLURRED VISION

## 2017-05-23 LAB — URINE CULTURE

## 2017-05-24 ENCOUNTER — Telehealth: Payer: Self-pay

## 2017-05-24 NOTE — Telephone Encounter (Signed)
Post ED Visit - Positive Culture Follow-up  Culture report reviewed by antimicrobial stewardship pharmacist:  [x]  Enzo BiNathan Batchelder, Pharm.D. []  Celedonio MiyamotoJeremy Frens, [address] BoulevardPharm.D., BCPS AQ-ID []  Garvin FilaMike Maccia, Pharm.D., BCPS []  Georgina PillionElizabeth Martin, Pharm.D., BCPS []  St. JamesMinh Pham, [place]arm.D., BCPS, AAHIVP []  Estella HuskMichelle Turner, Pharm.D., BCPS, AAHIVP []  Lysle Pearlachel Rumbarger, PharmD, BCPS []  Blake DivineShannon Parkey, PharmD []  Pollyann SamplesAndy Johnston, PharmD, BCPS  Positive urine culture Treated with Cephalexin, organism sensitive to the same and no further patient follow-up is required at this time.  Jerry CarasCullom, Marsha Hillman Burnett 05/24/2017, 10:03 AM

## 2017-08-25 ENCOUNTER — Emergency Department (HOSPITAL_COMMUNITY)
Admission: EM | Admit: 2017-08-25 | Discharge: 2017-08-25 | Discharge status: Against Medical Advice | Payer: Medicaid Other

## 2017-08-25 NOTE — ED Notes (Signed)
Called Pt in lobby with no response x3.

## 2017-08-26 ENCOUNTER — Emergency Department (HOSPITAL_COMMUNITY)
Admission: EM | Admit: 2017-08-26 | Discharge: 2017-08-27 | Disposition: A | Discharge status: Home / Self Care | Payer: Medicaid Other | Attending: Emergency Medicine | Admitting: Emergency Medicine

## 2017-08-26 ENCOUNTER — Other Ambulatory Visit: Payer: Self-pay

## 2017-08-26 ENCOUNTER — Institutional Professional Consult (permissible substitution): Payer: Medicaid Other | Admitting: Emergency Medicine

## 2017-08-26 ENCOUNTER — Encounter (HOSPITAL_COMMUNITY): Payer: Self-pay

## 2017-08-26 DIAGNOSIS — R51 Headache: Secondary | ICD-10-CM | POA: Insufficient documentation

## 2017-08-26 DIAGNOSIS — Z5321 Procedure and treatment not carried out due to patient leaving prior to being seen by health care provider: Secondary | ICD-10-CM | POA: Diagnosis not present

## 2017-08-26 NOTE — ED Notes (Signed)
No answer when called for recheck on vital signs 

## 2017-08-26 NOTE — ED Triage Notes (Signed)
Patient reports that 2 days ago she began having a pain on the right side of her head. Patient states she had a PE and has been on blood thinners since January 2019. Patient also c/o intermittent right arm numbness, pins and needles feeling. Patient denies any blurred vision.

## 2018-02-03 ENCOUNTER — Encounter: Payer: Self-pay | Admitting: *Deleted

## 2018-04-25 ENCOUNTER — Emergency Department (HOSPITAL_COMMUNITY): Payer: Medicaid Other

## 2018-04-25 ENCOUNTER — Other Ambulatory Visit: Payer: Self-pay

## 2018-04-25 ENCOUNTER — Encounter (HOSPITAL_COMMUNITY): Payer: Self-pay | Admitting: Emergency Medicine

## 2018-04-25 ENCOUNTER — Emergency Department (HOSPITAL_COMMUNITY)
Admission: EM | Admit: 2018-04-25 | Discharge: 2018-04-25 | Disposition: A | Discharge status: Home / Self Care | Payer: Medicaid Other | Attending: Emergency Medicine | Admitting: Emergency Medicine

## 2018-04-25 DIAGNOSIS — Z79899 Other long term (current) drug therapy: Secondary | ICD-10-CM | POA: Diagnosis not present

## 2018-04-25 DIAGNOSIS — Z87891 Personal history of nicotine dependence: Secondary | ICD-10-CM | POA: Insufficient documentation

## 2018-04-25 DIAGNOSIS — R059 Cough, unspecified: Secondary | ICD-10-CM

## 2018-04-25 DIAGNOSIS — N766 Ulceration of vulva: Secondary | ICD-10-CM

## 2018-04-25 DIAGNOSIS — R05 Cough: Secondary | ICD-10-CM

## 2018-04-25 DIAGNOSIS — I10 Essential (primary) hypertension: Secondary | ICD-10-CM | POA: Diagnosis not present

## 2018-04-25 DIAGNOSIS — R600 Localized edema: Secondary | ICD-10-CM | POA: Diagnosis present

## 2018-04-25 DIAGNOSIS — N390 Urinary tract infection, site not specified: Secondary | ICD-10-CM | POA: Diagnosis not present

## 2018-04-25 LAB — URINALYSIS, ROUTINE W REFLEX MICROSCOPIC
BILIRUBIN URINE: NEGATIVE
Glucose, UA: NEGATIVE mg/dL
Hgb urine dipstick: NEGATIVE
Ketones, ur: NEGATIVE mg/dL
Leukocytes, UA: NEGATIVE
Nitrite: POSITIVE — AB
Protein, ur: 30 mg/dL — AB
Specific Gravity, Urine: 1.016 (ref 1.005–1.030)
pH: 6 (ref 5.0–8.0)

## 2018-04-25 LAB — POC URINE PREG, ED: PREG TEST UR: NEGATIVE

## 2018-04-25 MED ORDER — BENZONATATE 100 MG PO CAPS: 100.0000 mg | ORAL_CAPSULE | Freq: Three times a day (TID) | ORAL | 0 refills | Status: DC

## 2018-04-25 MED ORDER — DOXYCYCLINE HYCLATE 100 MG PO TABS
100.0000 mg | ORAL_TABLET | Freq: Once | ORAL | Status: AC
  Administered 2018-04-25: 100 mg via ORAL
  Filled 2018-04-25: qty 1

## 2018-04-25 MED ORDER — DOXYCYCLINE HYCLATE 100 MG PO CAPS: 100.0000 mg | ORAL_CAPSULE | Freq: Two times a day (BID) | ORAL | 0 refills | Status: DC

## 2018-04-25 MED ORDER — BENZONATATE 100 MG PO CAPS
100.0000 mg | ORAL_CAPSULE | Freq: Once | ORAL | Status: AC
  Administered 2018-04-25: 100 mg via ORAL
  Filled 2018-04-25: qty 1

## 2018-04-25 NOTE — ED Notes (Signed)
Patient verbalizes understanding of discharge instructions. Opportunity for questioning and answers were provided. Armband removed by staff, pt discharged from ED.  

## 2018-04-25 NOTE — Discharge Instructions (Addendum)
Your ulceration could be due to herpes or potential syphilis infection.  We have sent cultures to test for such.  You will be notified if tested positive infection.  In the meantime, please take doxycycline as prescribed as treatment for urinary tract infection and for your cough.  Return to ED if your condition worsen or if you have any other concern.

## 2018-04-25 NOTE — ED Provider Notes (Signed)
[facility]   CSN: [id number] Arrival date & time: 04/25/18  [id number]     History   Chief Complaint Chief Complaint  Patient presents with  . Multiple Complaints    HPI [name] is a 28 y.o. female.  The history is provided by the patient. No language interpreter was used.     28 year old female presenting to the ED with multiple complaints.  Patient report her son recently had a cold, since then she has been having a persistent cough ongoing for the past week.  Cough is mostly nonproductive.  There is no associated runny nose sneezing sore throat or ear pain fever or chills.  Cough which is not improved despite using over-the-counter medication.  Notes no shortness of breath.  Denies any pleuritic chest pain.  Furthermore, patient noticed a small bump to the left side of her Vagina ongoing for the past 2 days.  It is nontender except mild stinging sensation when she washed.  She also noticed dark cloudy urine without any burning sensation when urinating, no urinary frequency or urgency.  She denies any vaginal bleeding or vaginal discharge.  Last menstrual period was a month ago.  No new sexual partner.  Past Medical History:  Diagnosis Date  . Bacterial vaginosis 08/15/2016  . Headache(784.0)   . Hypertension   . Incompetent cervix   . Preeclampsia, third trimester 03/21/2017  . Pregnancy induced hypertension   . Preterm labor   . Pulmonary emboli (HCC)    RIGHT LUNG  . Pulmonary embolism during pregnancy, antepartum   . Urinary tract infection     Patient Active Problem List   Diagnosis Date Noted  . TOBACCO USE, QUIT 01/03/2009  . OBESITY 11/30/2008    Past Surgical History:  Procedure Laterality Date  . BREAST FIBROADENOMA SURGERY  2006-7.   Removal;right  . CESAREAN SECTION    . CESAREAN SECTION WITH BILATERAL TUBAL LIGATION Bilateral 04/19/2017   Procedure: CESAREAN SECTION WITH BILATERAL TUBAL LIGATION;   Surgeon: Lesly DukesLeggett, Kelly H, MD;  [facility];  Service: Obstetrics;  Laterality: Bilateral;  . DILATION AND CURETTAGE OF UTERUS    . THERAPEUTIC ABORTION    . TUBAL LIGATION       OB History    Gravida  5   Para  2   Term  1   Preterm  1   AB  3   Living  3     SAB  2   TAB  1   Ectopic      Multiple  1   Live Births  3        Obstetric Comments  D&C         Home Medications    Prior to Admission medications   Medication Sig Start Date End Date Taking? Authorizing Provider  amLODipine (NORVASC) 5 MG tablet Take 1 tablet (5 mg total) by mouth daily. 04/21/17   Sharyon Cableogers, Veronica C, CNM  butalbital-acetaminophen-caffeine (FIORICET, ESGIC) [phone] MG tablet Take 1-2 tablets by mouth every 6 (six) hours as needed for headache. 05/20/17 05/20/18  Tilden Fossaees, Elizabeth, MD  cephALEXin (KEFLEX) 500 MG capsule Take 1 capsule (500 mg total) by mouth 3 (three) times daily. 05/20/17   Tilden Fossaees, Elizabeth, MD  ELIQUIS 5 MG TABS tablet Take 1 tablet (5 mg total) by mouth daily. 05/13/17   Constant, Peggy, MD  promethazine (PHENERGAN) 25 MG tablet Take 1 tablet (25 mg total) by mouth every 8 (eight)  hours as needed for nausea or vomiting. 05/20/17   Tilden Fossaees, Elizabeth, MD    Family History Family History  Problem Relation Age of Onset  . Diabetes Mother   . Diabetes Maternal Grandmother   . Hypertension Maternal Grandmother   . Cancer Maternal Grandmother        colon cancer, brain tumour  . Other Neg Hx     Social History Social History   Tobacco Use  . Smoking status: Former Smoker    Packs/day: 0.25    Types: Cigarettes    Last attempt to quit: 08/17/2016    Years since quitting: 1.6  . Smokeless tobacco: Former Engineer, waterUser  Substance Use Topics  . Alcohol use: No    Alcohol/week: 0.0 standard drinks  . Drug use: No    Comment: last use begin of preg     Allergies   Patient has no known allergies.   Review of Systems Review of Systems  All other systems  reviewed and are negative.    Physical Exam Updated Vital Signs BP 138/84 (BP Location: Right Arm)   Pulse 90   Temp 98.4 F (36.9 C) (Oral)   Resp 17   Ht 5\' 4"  (1.626 m)   Wt 127 kg   SpO2 100%   Breastfeeding No   BMI 48.06 kg/m   Physical Exam Vitals signs and nursing note reviewed. Exam conducted with a chaperone present.  Constitutional:      General: She is not in acute distress.    Appearance: She is well-developed.  HENT:     Head: Atraumatic.     Nose: Nose normal.     Mouth/Throat:     Mouth: Mucous membranes are moist.  Eyes:     Conjunctiva/sclera: Conjunctivae normal.  Neck:     Musculoskeletal: Neck supple.  Cardiovascular:     Rate and Rhythm: Normal rate and regular rhythm.  Pulmonary:     Effort: Pulmonary effort is normal.     Breath sounds: Normal breath sounds. No wheezing, rhonchi or rales.  Abdominal:     Palpations: Abdomen is soft.     Tenderness: There is no abdominal tenderness.  Genitourinary:    Comments: Small nontender ulceration approximately 2 mm in diameter noted to left mid labial region without surrounding erythema. Skin:    Findings: No rash.  Neurological:     Mental Status: She is alert.      ED Treatments / Results  Labs (all labs ordered are listed, but only abnormal results are displayed) Labs Reviewed  URINALYSIS, ROUTINE W REFLEX MICROSCOPIC - Abnormal; Notable for the following components:      Result Value   Protein, ur 30 (*)    Nitrite POSITIVE (*)    Bacteria, UA MANY (*)    All other components within normal limits  HSV CULTURE AND TYPING  HIV ANTIBODY (ROUTINE TESTING W REFLEX)  RPR  POC URINE PREG, ED    EKG None  Radiology Dg Chest 2 View  Result Date: 04/25/2018 CLINICAL DATA:  Cough and congestion. EXAM: CHEST - 2 VIEW COMPARISON:  Chest radiograph July 26, 2015 and chest CT May 20, 2017 FINDINGS: The lungs are clear. The heart size and pulmonary vascularity are normal. No adenopathy.  No bone lesions. IMPRESSION: No edema or consolidation. Electronically Signed   By: Bretta BangWilliam  Woodruff III M.D.   On: 04/25/2018 08:32    Procedures Procedures (including critical care time)  Medications Ordered in ED Medications  doxycycline (VIBRA-TABS) tablet 100  mg (100 mg Oral Given 04/25/18 1019)  benzonatate (TESSALON) capsule 100 mg (100 mg Oral Given 04/25/18 1019)     Initial Impression / Assessment and Plan / ED Course  I have reviewed the triage vital signs and the nursing notes.  Pertinent labs & imaging results that were available during my care of the patient were reviewed by me and considered in my medical decision making (see chart for details).     BP 138/84 (BP Location: Right Arm)   Pulse 90   Temp 98.4 F (36.9 C) (Oral)   Resp 17   Ht 5\' 4"  (1.626 m)   Wt 127 kg   SpO2 100%   Breastfeeding No   BMI 48.06 kg/m    Final Clinical Impressions(s) / ED Diagnoses   Final diagnoses:  Genital labial ulcer  Cough  Acute lower UTI    ED Discharge Orders         Ordered    doxycycline (VIBRAMYCIN) 100 MG capsule  2 times daily     04/25/18 1036    benzonatate (TESSALON) 100 MG capsule  Every 8 hours     04/25/18 1036         10:34 AM Patient had a cough, chest x-ray without evidence of pneumonia.  She complaining of cloudy urine, UA shows positive nitrite concerning for UTI.  She also noticed a nontender with mild stinging sensation ulceration to her left labial region.  This could be HSV versus syphilis.  Appropriate test was obtained.  Patient discharged home with doxycycline, she will be notified if she test positive for any specific STD.  Her pregnancy test was negative.   Fayrene Helper, PA-C 04/25/18 1038    Derwood Kaplan, MD 04/26/18 1127

## 2018-04-25 NOTE — ED Triage Notes (Signed)
Pt arrives to ED from home with complaints of a cough for the last couple of days, a new bump on her vagina that appeared yesterday and cloudy urine. Pt denies fevers or chills at home. Pt placed in position of comfort with bed locked and lowered, call bell in reach.

## 2018-04-26 LAB — RPR: RPR: NONREACTIVE

## 2018-04-26 LAB — HIV ANTIBODY (ROUTINE TESTING W REFLEX): HIV SCREEN 4TH GENERATION: NONREACTIVE

## 2018-04-27 LAB — HSV CULTURE AND TYPING

## 2018-04-30 ENCOUNTER — Emergency Department (HOSPITAL_COMMUNITY)
Admission: EM | Admit: 2018-04-30 | Discharge: 2018-04-30 | Disposition: A | Discharge status: Home / Self Care | Payer: Medicaid Other | Attending: Emergency Medicine | Admitting: Emergency Medicine

## 2018-04-30 DIAGNOSIS — B009 Herpesviral infection, unspecified: Secondary | ICD-10-CM | POA: Diagnosis not present

## 2018-04-30 DIAGNOSIS — Z87891 Personal history of nicotine dependence: Secondary | ICD-10-CM | POA: Insufficient documentation

## 2018-04-30 DIAGNOSIS — I10 Essential (primary) hypertension: Secondary | ICD-10-CM | POA: Diagnosis not present

## 2018-04-30 DIAGNOSIS — Z79899 Other long term (current) drug therapy: Secondary | ICD-10-CM | POA: Insufficient documentation

## 2018-04-30 DIAGNOSIS — R899 Unspecified abnormal finding in specimens from other organs, systems and tissues: Secondary | ICD-10-CM | POA: Diagnosis present

## 2018-04-30 NOTE — ED Triage Notes (Signed)
Pt reports she was here on 1/17 and had std testing, states she looked at her results on mychart last night and was uncertain about what they meant so she returned to have results explained or to get retested if needed.

## 2018-04-30 NOTE — ED Provider Notes (Signed)
[facility]   CSN: [id number] Arrival date & time: 04/30/18  [id number]     History   Chief Complaint Chief Complaint  Patient presents with  . Results    HPI [name] is a 28 y.o. female with a hx of HTN, PE during pregnancy, and prior tobacco use who presents to discuss results from prior ED visit. Per chart review: patient seen in the ED 01/17 with vaginal lesion, appears this lesion was cultured, thought to be HSV vs syphilis. She states she checked online and it said that she was positive for herpes and she wanted to discuss this. She has questions regarding this diagnosis. She states her vaginal lesion is improving. No specific alleviating/aggravating factors. Denies fever, chills, dysuria, or vomiting.   HPI  Past Medical History:  Diagnosis Date  . Bacterial vaginosis 08/15/2016  . Headache(784.0)   . Hypertension   . Incompetent cervix   . Preeclampsia, third trimester 03/21/2017  . Pregnancy induced hypertension   . Preterm labor   . Pulmonary emboli (HCC)    RIGHT LUNG  . Pulmonary embolism during pregnancy, antepartum   . Urinary tract infection     Patient Active Problem List   Diagnosis Date Noted  . TOBACCO USE, QUIT 01/03/2009  . OBESITY 11/30/2008    Past Surgical History:  Procedure Laterality Date  . BREAST FIBROADENOMA SURGERY  2006-7.   Removal;right  . CESAREAN SECTION    . CESAREAN SECTION WITH BILATERAL TUBAL LIGATION Bilateral 04/19/2017   Procedure: CESAREAN SECTION WITH BILATERAL TUBAL LIGATION;  Surgeon: Lesly DukesLeggett, Kelly H, MD;  [facility];  Service: Obstetrics;  Laterality: Bilateral;  . DILATION AND CURETTAGE OF UTERUS    . THERAPEUTIC ABORTION    . TUBAL LIGATION       OB History    Gravida  5   Para  2   Term  1   Preterm  1   AB  3   Living  3     SAB  2   TAB  1   Ectopic      Multiple  1   Live Births  3        Obstetric Comments  D&C         Home Medications    Prior to Admission medications   Medication Sig Start Date End Date Taking? Authorizing Provider  benzonatate (TESSALON) 100 MG capsule Take 1 capsule (100 mg total) by mouth every 8 (eight) hours. 04/25/18   Fayrene Helperran, Bowie, PA-C  doxycycline (VIBRAMYCIN) 100 MG capsule Take 1 capsule (100 mg total) by mouth 2 (two) times daily. One po bid x 7 days 04/25/18   Fayrene Helperran, Bowie, PA-C    Family History Family History  Problem Relation Age of Onset  . Diabetes Mother   . Diabetes Maternal Grandmother   . Hypertension Maternal Grandmother   . Cancer Maternal Grandmother        colon cancer, brain tumour  . Other Neg Hx     Social History Social History   Tobacco Use  . Smoking status: Former Smoker    Packs/day: 0.25    Types: Cigarettes    Last attempt to quit: 08/17/2016    Years since quitting: 1.7  . Smokeless tobacco: Former Engineer, waterUser  Substance Use Topics  . Alcohol use: No    Alcohol/week: 0.0 standard drinks  . Drug use: No    Comment: last use begin of preg  Allergies   Patient has no known allergies.   Review of Systems Review of Systems  Constitutional: Negative for chills and fever.  Gastrointestinal: Negative for vomiting.  Genitourinary: Positive for genital sores.     Physical Exam Updated Vital Signs BP (!) 143/102   Pulse 99   Temp 98.2 F (36.8 C) (Oral)   Resp 16   SpO2 100%   Physical Exam Vitals signs and nursing note reviewed.  Constitutional:      General: She is not in acute distress.    Appearance: She is well-developed.  HENT:     Head: Normocephalic and atraumatic.  Eyes:     General:        Right eye: No discharge.        Left eye: No discharge.     Conjunctiva/sclera: Conjunctivae normal.  Neurological:     Mental Status: She is alert.     Comments: Clear speech.   Psychiatric:        Behavior: Behavior normal.        Thought Content: Thought content normal.      ED Treatments / Results   Labs (all labs ordered are listed, but only abnormal results are displayed) Labs Reviewed - No data to display  EKG None  Radiology No results found.  Procedures Procedures (including critical care time)  Medications Ordered in ED Medications - No data to display   Initial Impression / Assessment and Plan / ED Course  I have reviewed the triage vital signs and the nursing notes.  Pertinent labs & imaging results that were available during my care of the patient were reviewed by me and considered in my medical decision making (see chart for details).   Patient presents to the ED to discuss results form prior visit. 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 I discussed results, need to discuss with sexual partners, potential spread of infection, and safe sex with the patient. We discussed option of tx for current lesion with anti-viral therapy. She became upset, states that the lesions is improving and she does not wish to have tx. We discussed discharge paperwork with further information and patient did not wish to remain in the ER for this. Discussed results, tx plan, need for follow up, and return precautions. Patient confirmed understanding.   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

## 2018-04-30 NOTE — ED Notes (Signed)
ED Provider at bedside. 

## 2018-04-30 NOTE — ED Notes (Signed)
Patient left prior to discharge papers being printed.

## 2018-07-06 IMAGING — US US MFM OB TRANSVAGINAL
1 series · 14 of 28 positions shown · non-contrast
Comparison: none

[Series 1: us mfm ob transvaginal · 14 of 68 slices shown]
[im 3/68]
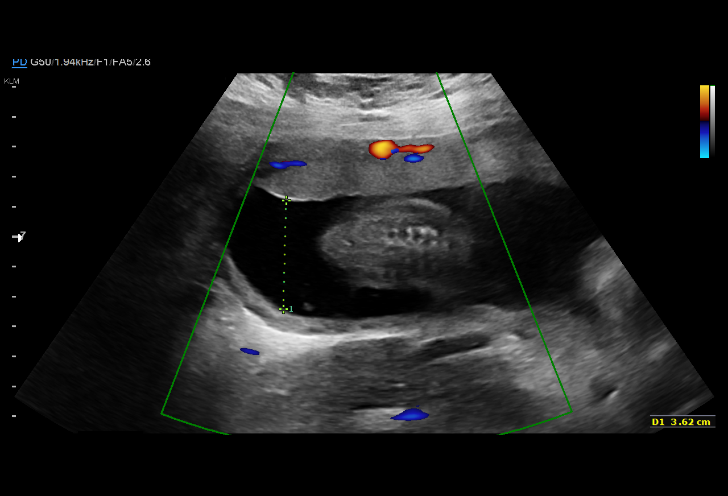
[im 8/68]
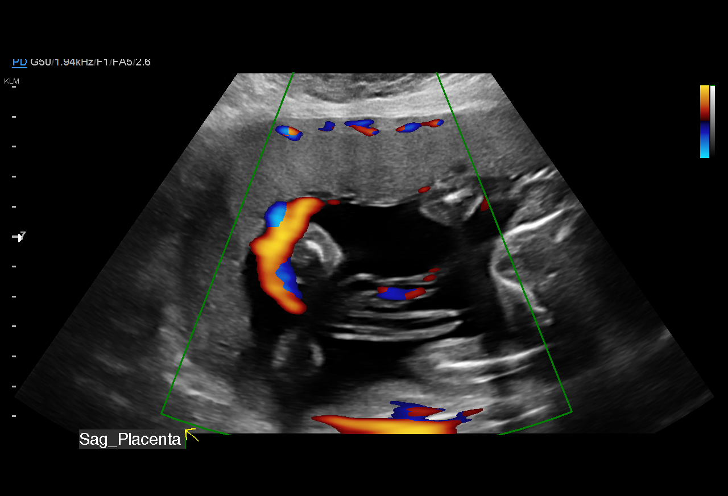
[im 13/68]
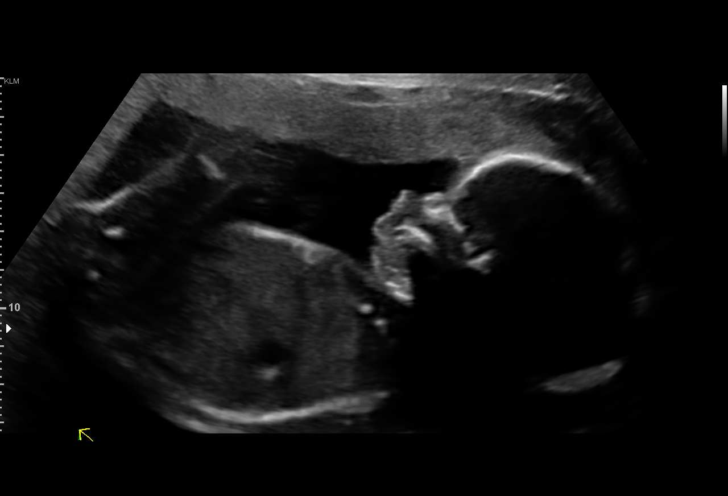
[im 18/68]
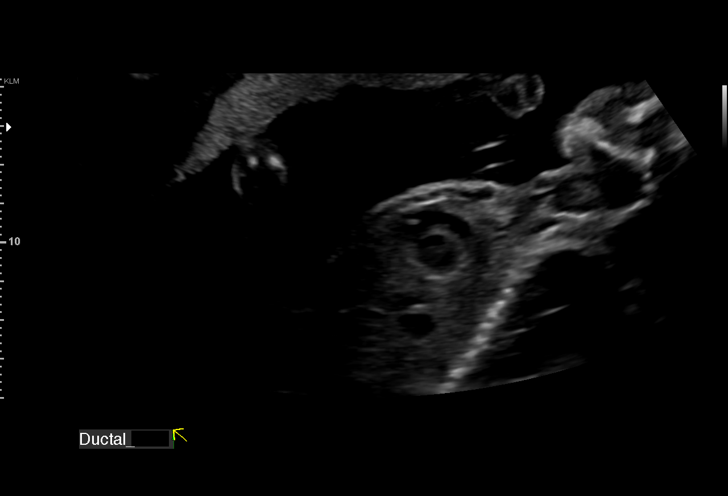
[im 23/68]
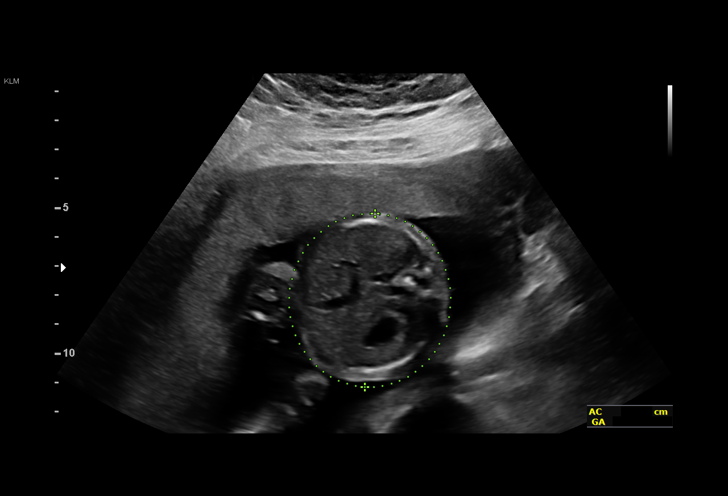
[im 28/68]
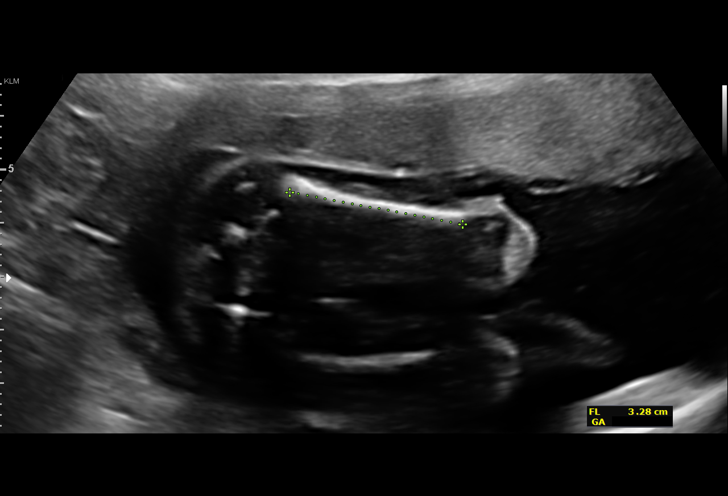
[im 33/68]
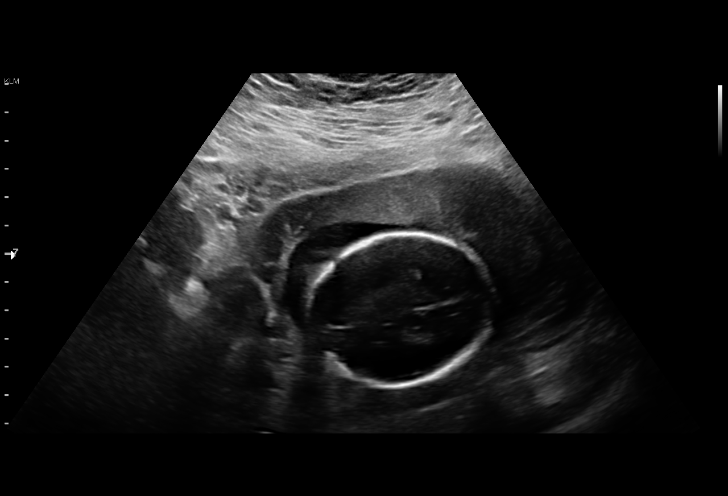
[im 38/68]
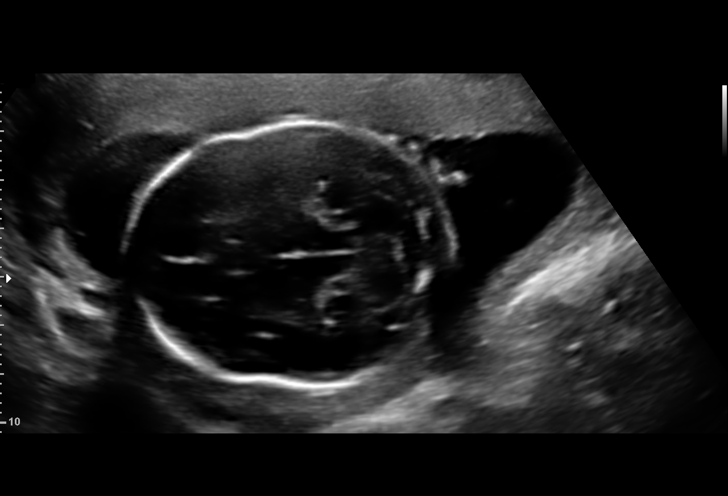
[im 43/68]
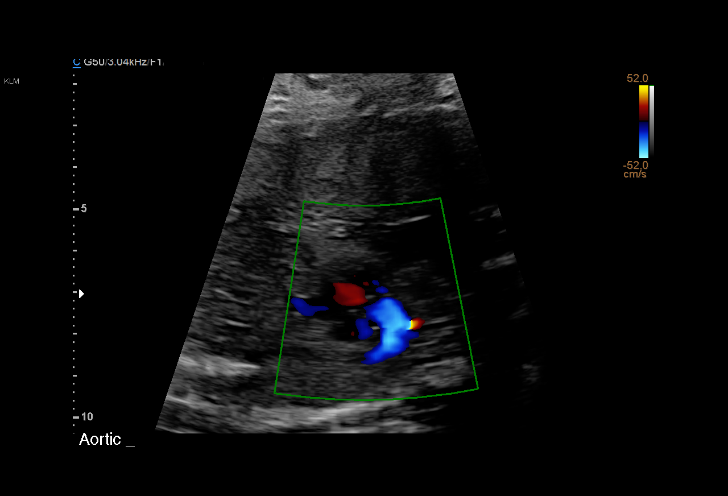
[im 48/68]
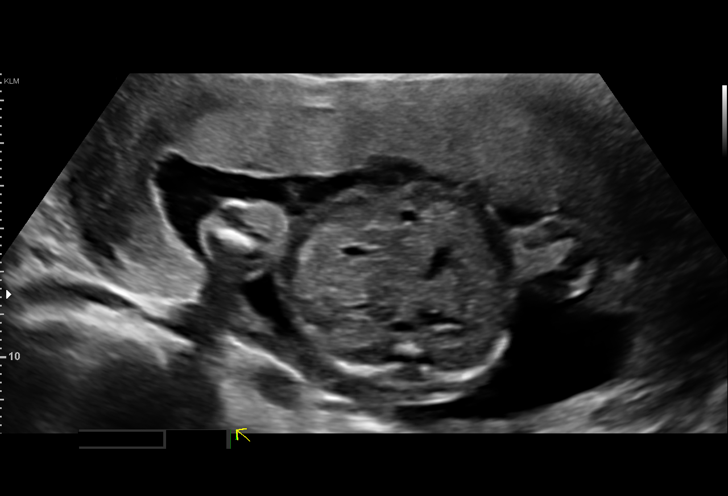
[im 53/68]
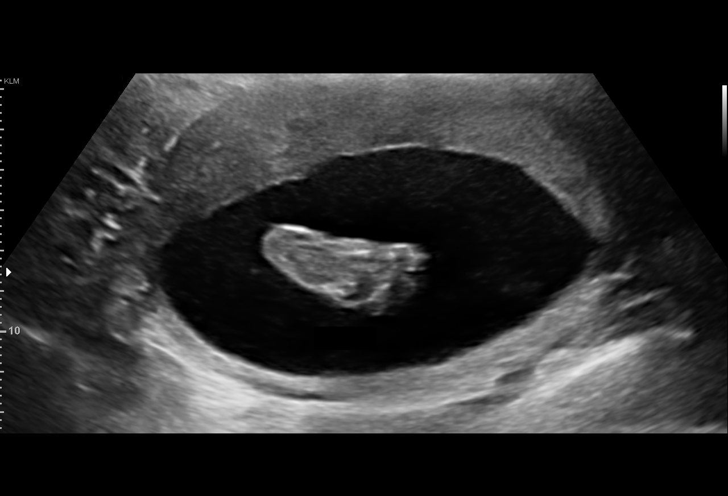
[im 58/68]
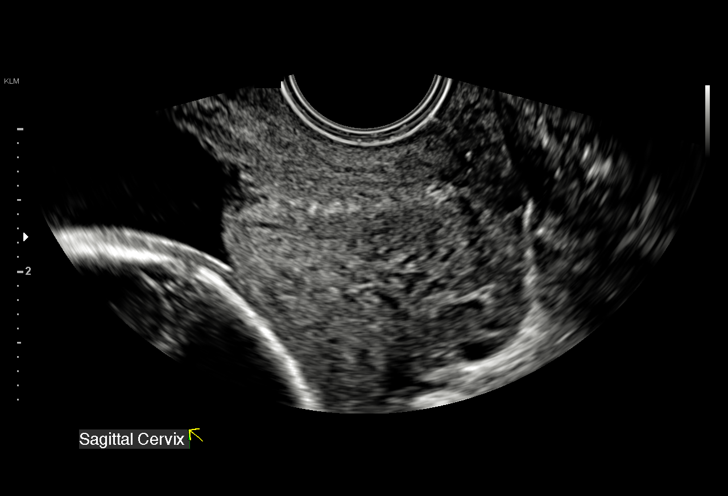
[im 63/68]
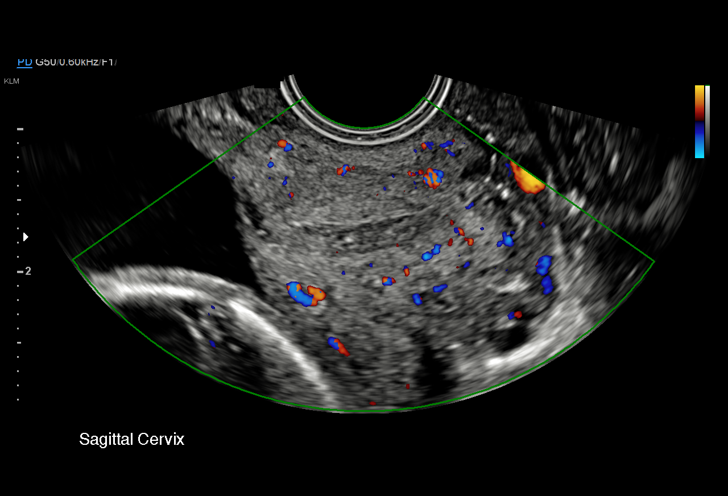
[im 68/68]
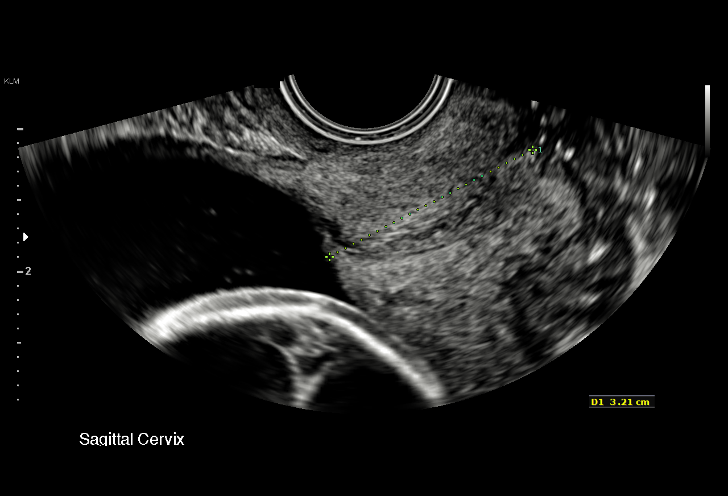

[14 of 28 positions shown; findings below may reference images not displayed]

am)


Indications

23 weeks gestation of pregnancy
Cervical incompetence, second trimester
Poor obstetric history: Previous preterm
delivery, antepartum x 2 35 weeks
Previous cesarean delivery, antepartum x 1
H/O termination x 5
Obesity complicating pregnancy, second
trimester
Poor obstetric history: Previous
preeclampsia / eclampsia/gestational HTN
Antenatal follow-up for nonvisualized fetal
anatomy
OB History

Blood Type:            Height:  5'4"   Weight (lb):  238       BMI:
Gravidity:    10        Prem:   2         SAB:   3
TOP:          5        Living:  2
Fetal Evaluation

Num Of Fetuses:     1
Fetal Heart         150
Rate(bpm):
Cardiac Activity:   Observed
Presentation:       Cephalic
Placenta:           Anterior, above cervical os
P. Cord Insertion:  Visualized, central
Amniotic Fluid
AFI FV:      Subjectively within normal limits

Largest Pocket(cm)
3.6
Biometry

BPD:      55.4  mm     G. Age:  22w 6d         30  %    CI:        75.68   %    70 - 86
FL/HC:      17.5   %    19.2 -
HC:      201.9  mm     G. Age:  22w 2d          8  %    HC/AC:      1.11        1.05 -
AC:      181.9  mm     G. Age:  23w 0d         34  %    FL/BPD:     63.9   %    71 - 87
FL:       35.4  mm     G. Age:  21w 1d        < 3  %    FL/AC:      19.5   %    20 - 24

Est. FW:     489  gm      1 lb 1 oz     28  %
Gestational Age

LMP:           23w 2d        Date:  07/11/16                 EDD:   04/17/17
U/S Today:     22w 2d                                        EDD:   04/24/17
Best:          23w 2d     Det. By:  LMP  (07/11/16)          EDD:   04/17/17
Anatomy

Cranium:               Appears normal         Aortic Arch:            Appears normal
Cavum:                 Appears normal         Ductal Arch:            Appears normal
Ventricles:            Appears normal         Diaphragm:              Appears normal
Choroid Plexus:        Appears normal         Stomach:                Appears normal, left
sided
Cerebellum:            Appears normal         Abdomen:                Appears normal
Posterior Fossa:       Appears normal         Abdominal Wall:         Appears nml (cord
insert, abd wall)
Nuchal Fold:           Previously seen        Cord Vessels:           Previously seen
Face:                  Appears normal         Kidneys:                Appear normal
(orbits and profile)
Lips:                  Previously seen        Bladder:                Appears normal
Thoracic:              Appears normal         Spine:                  Previously seen
Heart:                 Appears normal         Upper Extremities:      Previously seen
(4CH, axis, and situs
RVOT:                  Appears normal         Lower Extremities:      Previously seen
LVOT:                  Appears normal

Other:  Male gender. Heels previously visualized. Technically difficult due to
maternal habitus and fetal position.
Cervix Uterus Adnexa

Cervix
Length:              3  cm.
Normal appearance by transvaginal scan
Impression

Singleton intrauterine pregnancy at 23+2 weeks, here to
complete anatomic survey. history of 5 previous TAB
Interval review of the anatomy shows no sonographic
markers for aneuploidy or structural anomalies
Amniotic fluid volume is normal with an AFI of
Estimated fetal weight is 489g which is growth in the 28th
percentile
Transvaginal scan shows a cervical length of 30 mm without
funneling
Recommendations

Would consider evaluation of fetal growth c. 28-30 weeks due
to low growth percentile today

## 2018-11-17 IMAGING — CT CT ANGIO CHEST
1 of 5 series · 18 of 31 positions shown · IV contrast (isovue)
Comparison: 07/26/2015

CLINICAL DATA: Shortness of breath, right-sided chest pain

EXAM:
CT ANGIOGRAPHY CHEST WITH CONTRAST
TECHNIQUE: Multidetector CT imaging of the chest was performed using the
standard protocol during bolus administration of intravenous
contrast. Multiplanar CT image reconstructions and MIPs were
obtained to evaluate the vascular anatomy.
CONTRAST:  100 mL Isovue 370 intravenous

[Series 8: (person_name) thins · axial · 0.82mm/px · z∈[+843,+1086]mm · 18 of 391 slices shown]
[im 22/391  lung]
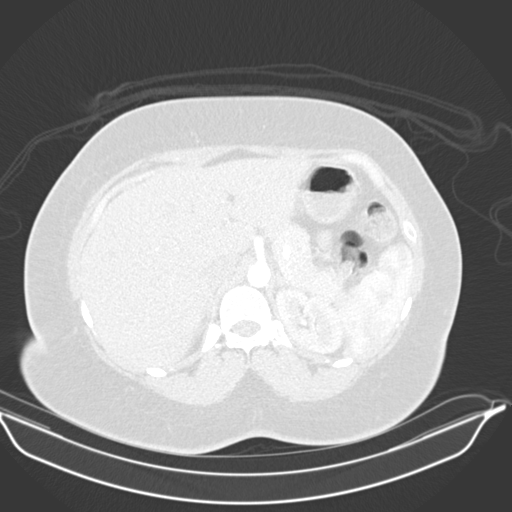
[im 44/391  mediastinal]
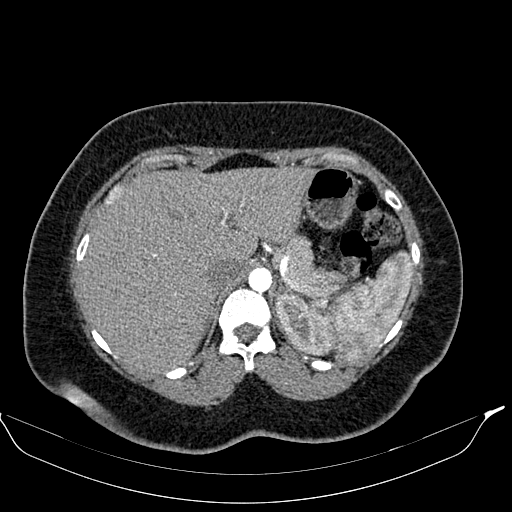
[im 66/391  lung]
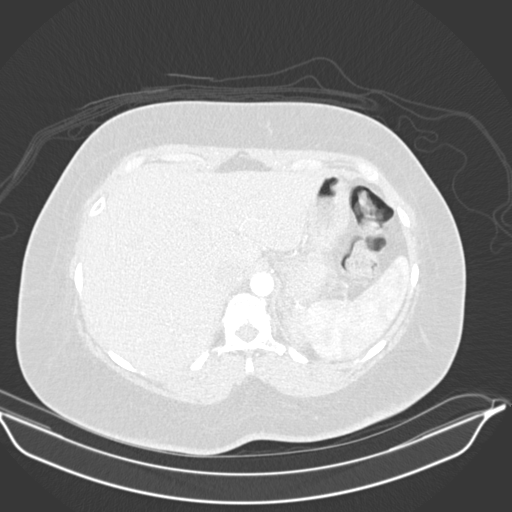
[im 87/391  mediastinal]
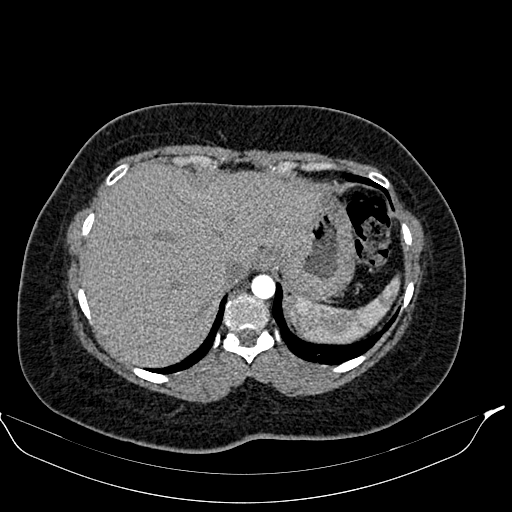
[im 109/391  lung]
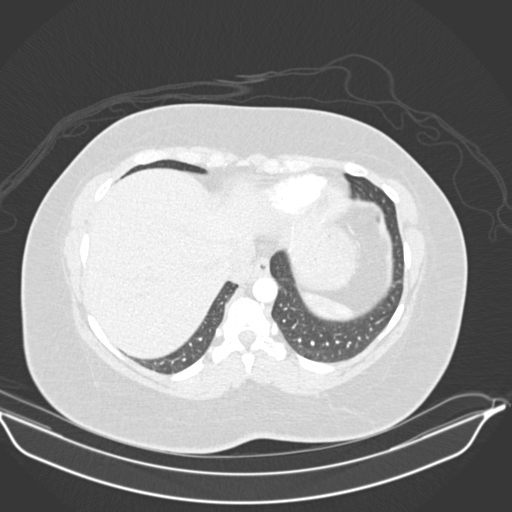
[im 131/391  mediastinal]
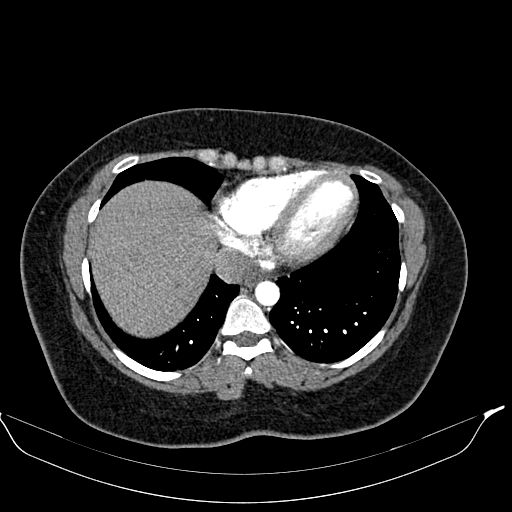
[im 152/391  lung]
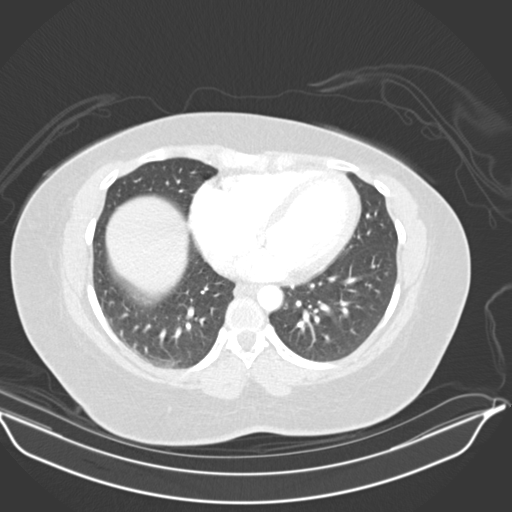
[im 174/391  mediastinal]
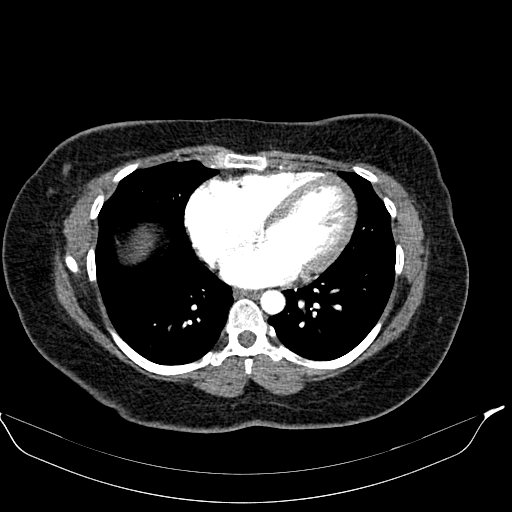
[im 182/391  lung]
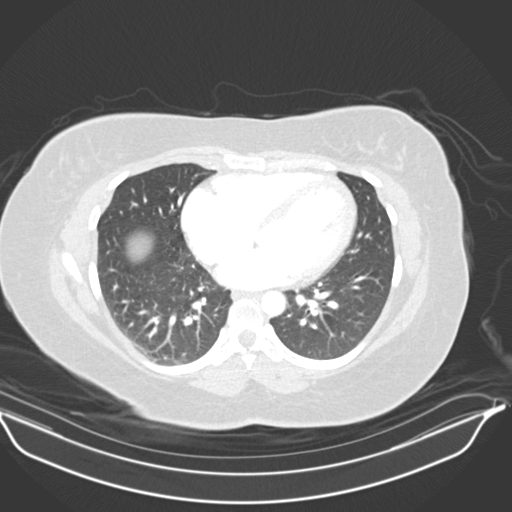
[im 196/391  mediastinal]
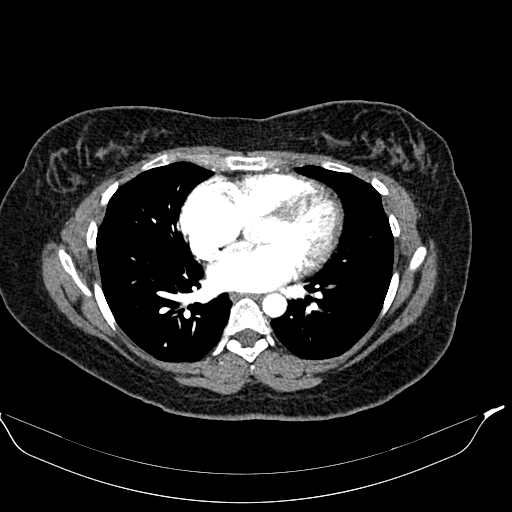
[im 217/391  lung]
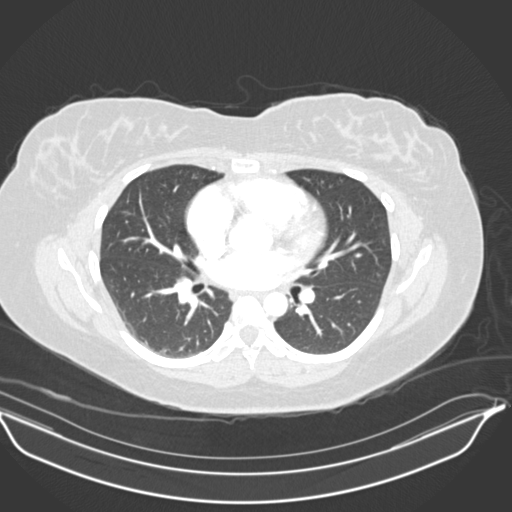
[im 239/391  mediastinal]
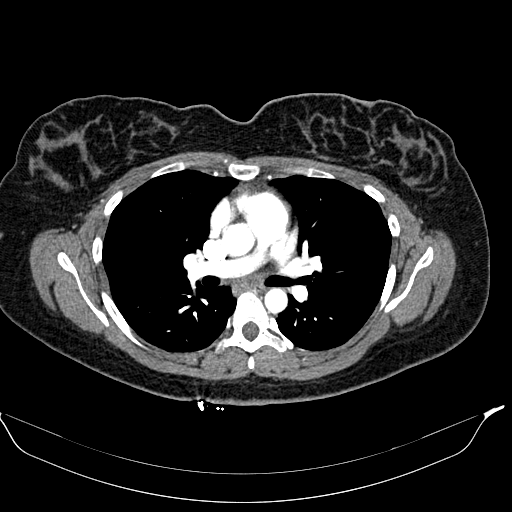
[im 261/391  lung]
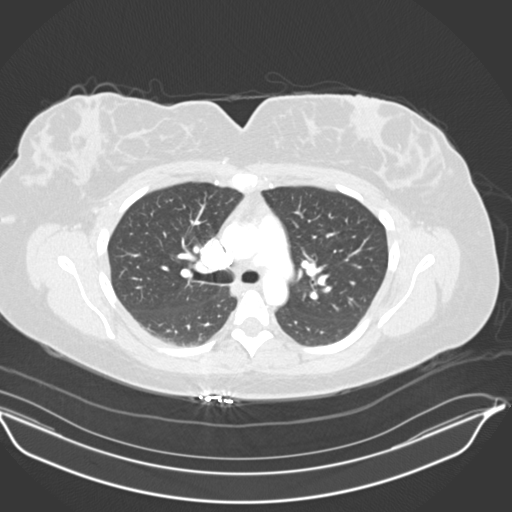
[im 282/391  mediastinal]
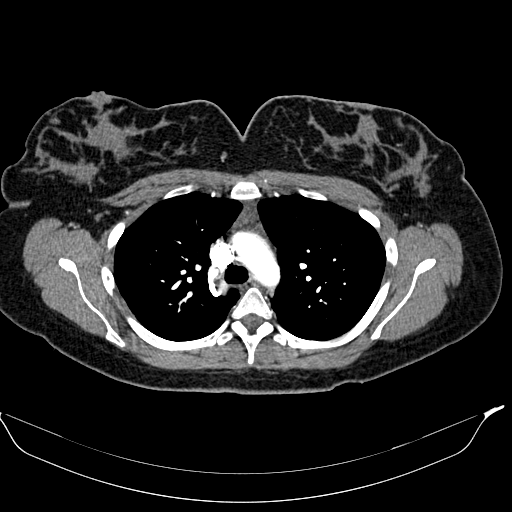
[im 304/391  lung]
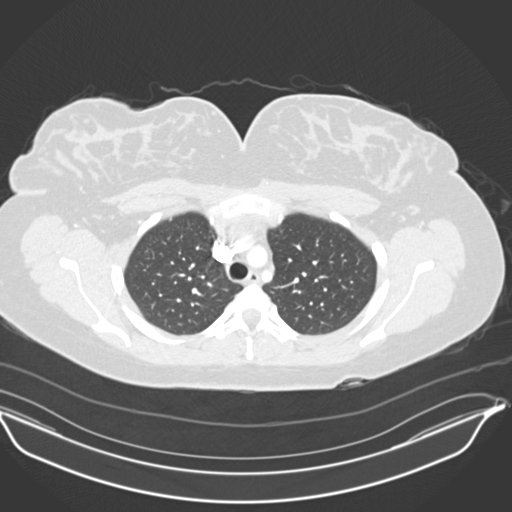
[im 326/391  mediastinal]
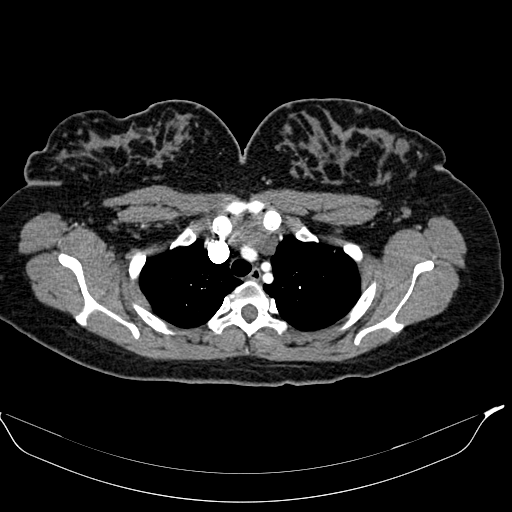
[im 347/391  lung]
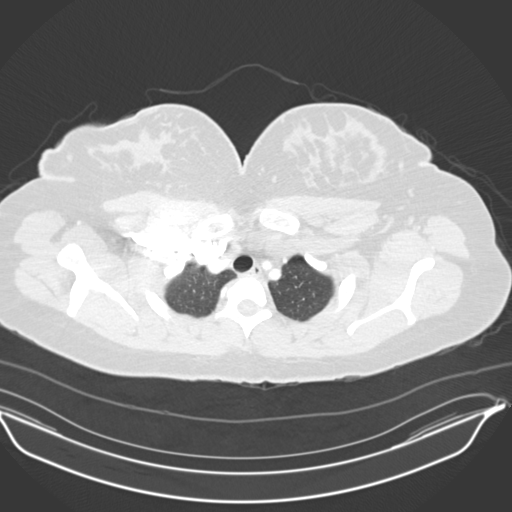
[im 369/391  mediastinal]
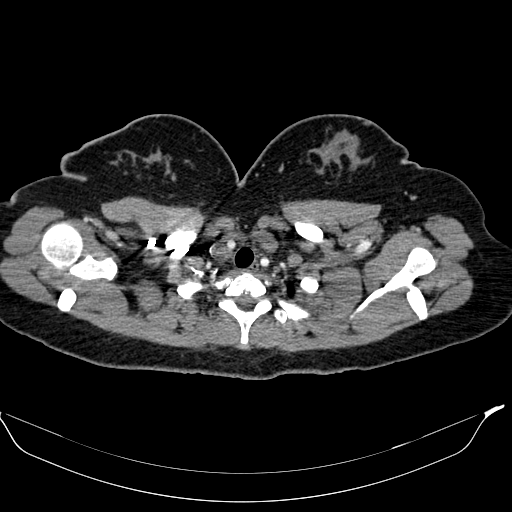

[18 of 31 positions shown; findings below may reference images not displayed]

FINDINGS: Cardiovascular: Satisfactory opacification of the pulmonary arteries
to the segmental level. Mild respiratory motion artifact limits
evaluation of lower lobe pulmonary artery branches. No definite
acute embolus is seen. Nonaneurysmal aorta. No dissection. Normal
heart size. No pericardial effusion.

Mediastinum/Nodes: No enlarged mediastinal, hilar, or axillary lymph
nodes. Thyroid gland, trachea, and esophagus demonstrate no
significant findings.

Lungs/Pleura: Lungs are clear. No pleural effusion or pneumothorax.

Upper Abdomen: No acute abnormality.

Musculoskeletal: No chest wall abnormality. No acute or significant
osseous findings.

Review of the MIP images confirms the above findings.
IMPRESSION: 1. Negative for acute pulmonary embolus or aortic dissection
2. Clear lung fields

## 2019-01-08 ENCOUNTER — Other Ambulatory Visit: Payer: Self-pay

## 2019-01-08 DIAGNOSIS — Z20822 Contact with and (suspected) exposure to covid-19: Secondary | ICD-10-CM

## 2019-01-09 LAB — NOVEL CORONAVIRUS, NAA: SARS-CoV-2, NAA: NOT DETECTED

## 2019-02-23 ENCOUNTER — Other Ambulatory Visit: Payer: Self-pay

## 2019-02-23 ENCOUNTER — Encounter (HOSPITAL_COMMUNITY): Payer: Self-pay | Admitting: Emergency Medicine

## 2019-02-23 ENCOUNTER — Emergency Department (HOSPITAL_COMMUNITY)
Admission: EM | Admit: 2019-02-23 | Discharge: 2019-02-24 | Disposition: A | Discharge status: Home / Self Care | Payer: Medicaid Other | Attending: Emergency Medicine | Admitting: Emergency Medicine

## 2019-02-23 ENCOUNTER — Emergency Department (HOSPITAL_COMMUNITY): Payer: Medicaid Other

## 2019-02-23 DIAGNOSIS — R079 Chest pain, unspecified: Secondary | ICD-10-CM | POA: Insufficient documentation

## 2019-02-23 DIAGNOSIS — Z5321 Procedure and treatment not carried out due to patient leaving prior to being seen by health care provider: Secondary | ICD-10-CM | POA: Insufficient documentation

## 2019-02-23 LAB — BASIC METABOLIC PANEL
Anion gap: 12 (ref 5–15)
BUN: 13 mg/dL (ref 6–20)
CO2: 23 mmol/L (ref 22–32)
Calcium: 9.3 mg/dL (ref 8.9–10.3)
Chloride: 105 mmol/L (ref 98–111)
Creatinine, Ser: 1.26 mg/dL — ABNORMAL HIGH (ref 0.44–1.00)
GFR calc Af Amer: >60 mL/min (ref >60)
GFR calc non Af Amer: 57 mL/min — ABNORMAL LOW (ref >60)
Glucose, Bld: 68 mg/dL — ABNORMAL LOW (ref 70–99)
Potassium: 3.9 mmol/L (ref 3.5–5.1)
Sodium: 140 mmol/L (ref 135–145)

## 2019-02-23 LAB — CBC
HCT: 41.2 % (ref 36.0–46.0)
Hemoglobin: 13.2 g/dL (ref 12.0–15.0)
MCH: 28.4 pg (ref 26.0–34.0)
MCHC: 32 g/dL (ref 30.0–36.0)
MCV: 88.8 fL (ref 80.0–100.0)
Platelets: 327 10*3/uL (ref 150–400)
RBC: 4.64 MIL/uL (ref 3.87–5.11)
RDW: 13.6 % (ref 11.5–15.5)
WBC: 7.5 10*3/uL (ref 4.0–10.5)
nRBC: 0 % (ref 0.0–0.2)

## 2019-02-23 LAB — I-STAT BETA HCG BLOOD, ED (MC, WL, AP ONLY): I-stat hCG, quantitative: <5 m[IU]/mL (ref <5)

## 2019-02-23 LAB — TROPONIN I (HIGH SENSITIVITY): Troponin I (High Sensitivity): 3 ng/L (ref <18)

## 2019-02-23 MED ORDER — SODIUM CHLORIDE 0.9% FLUSH: 3.0000 mL | Freq: Once | INTRAVENOUS | Status: DC

## 2019-02-23 NOTE — ED Triage Notes (Signed)
Patient here with chest pain since yesterday.  She states that it hurts when she takes a deep breath, she states that she has a throbbing sensation.  No shortness of breath, no nausea or vomiting.

## 2019-02-24 LAB — TROPONIN I (HIGH SENSITIVITY): Troponin I (High Sensitivity): <2 ng/L (ref <18)

## 2019-05-04 ENCOUNTER — Ambulatory Visit: Payer: Medicaid Other | Attending: Internal Medicine

## 2019-05-04 DIAGNOSIS — Z20822 Contact with and (suspected) exposure to covid-19: Secondary | ICD-10-CM

## 2019-05-05 LAB — NOVEL CORONAVIRUS, NAA: SARS-CoV-2, NAA: NOT DETECTED

## 2019-11-02 ENCOUNTER — Telehealth: Payer: Self-pay | Admitting: *Deleted

## 2019-11-02 NOTE — Telephone Encounter (Signed)
Spoke with pt today, had questions about tubal. Pt had tubal in 2019 and wanted to know "how her tubes were tied". Pt was made aware, per note, her tubes had Filshie clips.   Pt states she wants it reversed and how to proceed. Pt made aware that in the past insurance, [employer], would not normally cover a reversal.  Pt advised to contact insurance carrier as [employer] is now under managed plans to verify if procedure would be covered or not.  Pt made aware that she may then make an appt in office to discuss provider.  Pt states understanding.

## 2020-04-22 ENCOUNTER — Ambulatory Visit (HOSPITAL_COMMUNITY)
Admission: EM | Admit: 2020-04-22 | Discharge: 2020-04-22 | Disposition: A | Discharge status: Home / Self Care | Payer: Medicaid Other | Attending: Student | Admitting: Student

## 2020-04-22 ENCOUNTER — Encounter (HOSPITAL_COMMUNITY): Payer: Self-pay

## 2020-04-22 ENCOUNTER — Other Ambulatory Visit: Payer: Self-pay

## 2020-04-22 DIAGNOSIS — U071 COVID-19: Secondary | ICD-10-CM | POA: Insufficient documentation

## 2020-04-22 DIAGNOSIS — J069 Acute upper respiratory infection, unspecified: Secondary | ICD-10-CM | POA: Diagnosis present

## 2020-04-22 DIAGNOSIS — Z8759 Personal history of other complications of pregnancy, childbirth and the puerperium: Secondary | ICD-10-CM | POA: Diagnosis not present

## 2020-04-22 DIAGNOSIS — Z87891 Personal history of nicotine dependence: Secondary | ICD-10-CM | POA: Diagnosis not present

## 2020-04-22 DIAGNOSIS — Z86711 Personal history of pulmonary embolism: Secondary | ICD-10-CM | POA: Diagnosis not present

## 2020-04-22 MED ORDER — PROMETHAZINE-DM 6.25-15 MG/5ML PO SYRP: 5.0000 mL | ORAL_SOLUTION | Freq: Four times a day (QID) | ORAL | 0 refills | Status: DC | PRN

## 2020-04-22 MED ORDER — ACETAMINOPHEN 325 MG PO TABS
650.0000 mg | ORAL_TABLET | Freq: Once | ORAL | Status: AC
  Administered 2020-04-22: 650 mg via ORAL

## 2020-04-22 MED ORDER — BENZONATATE 100 MG PO CAPS: 100.0000 mg | ORAL_CAPSULE | Freq: Three times a day (TID) | ORAL | 0 refills | Status: DC

## 2020-04-22 MED ORDER — ACETAMINOPHEN 325 MG PO TABS
ORAL_TABLET | ORAL | Status: AC
  Filled 2020-04-22: qty 2

## 2020-04-22 NOTE — ED Triage Notes (Signed)
Pt presents with non productive cough, nasal drainage, chills, generalized body aches, and dizziness since yesterday.

## 2020-04-22 NOTE — Discharge Instructions (Addendum)
-  Tessalon as needed for cough. Take one pill up to 3x daily (every 8 hours) -Promethazine DM cough syrup for congestion/cough. This could make you drowsy, so take at night before bed. -For fevers/chills, body aches, headaches- use Tylenol and Ibuprofen. You can alternate these for maximum effect. Use up to 3000mg  Tylenol daily and 3200mg  Ibuprofen daily. Make sure to take ibuprofen with food. Check the bottle of ibuprofen/tylenol for specific dosage instructions. -Head straight to ED if you experience swelling in your legs; shortness of breath; chest pain; etc.   We are currently awaiting result of your PCR covid-19 test. This typically comes back in 1-2 days. We'll call you if the result is positive. Otherwise, the result will be sent electronically to your MyChart. You can also call this clinic and ask for your result via telephone.   Please isolate at home while awaiting these results. If your test is positive for Covid-19, continue to isolate at home for 5 days if you have mild symptoms, or a total of 10 days from symptom onset if you have more severe symptoms. If you quarantine for a shorter period of time (i.e. 5 days), make sure to wear a mask until day 10 of symptoms. Treat your symptoms at home with OTC remedies like tylenol/ibuprofen, mucinex, nyquil, etc. Seek medical attention if you develop high fevers, chest pain, shortness of breath, ear pain, facial pain, etc. Make sure to get up and move around every 2-3 hours while convalescing to help prevent blood clots. Drink plenty of fluids, and rest as much as possible.

## 2020-04-22 NOTE — ED Provider Notes (Addendum)
[facility]    CSN: [id number] Arrival date & time: 04/22/20  1809      History   Chief Complaint Chief Complaint  Patient presents with  . Cough  . Generalized Body Aches  . Chills  . Dizziness    HPI [name] is a 30 y.o. female Presenting for URI symptoms for 2 days. History of hypertension, one isolated PE during pregnancy. Presenting with nonproductive cough, dizziness, congestion. History of one PE while pregnant, no issues with this since then. Pt is not on blood thinner. Denies swelling or pain in calves, denies shortness of breath. States she's dizzy when standing. Feels like the room is spinning. Denies hearing changes, ringing in ears, sensation of room spinning at rest, headaches, vision changes. States she has been hydrating well at home. Denies fevers/chills, n/v/d, shortness of breath, chest pain,  facial pain, teeth pain, headaches, sore throat, loss of taste/smell, swollen lymph nodes, ear pain. Denies chest pain, shortness of breath, confusion, high fevers.    HPI  Past Medical History:  Diagnosis Date  . Bacterial vaginosis 08/15/2016  . Headache(784.0)   . Hypertension   . Incompetent cervix   . Preeclampsia, third trimester 03/21/2017  . Pregnancy induced hypertension   . Preterm labor   . Pulmonary emboli (HCC)    RIGHT LUNG  . Pulmonary embolism during pregnancy, antepartum   . Urinary tract infection     Patient Active Problem List   Diagnosis Date Noted  . TOBACCO USE, QUIT 01/03/2009  . OBESITY 11/30/2008    Past Surgical History:  Procedure Laterality Date  . BREAST FIBROADENOMA SURGERY  2006-7.   Removal;right  . CESAREAN SECTION    . CESAREAN SECTION WITH BILATERAL TUBAL LIGATION Bilateral 04/19/2017   Procedure: CESAREAN SECTION WITH BILATERAL TUBAL LIGATION;  Surgeon: Lesly Dukes, MD;  Location: [facility] BIRTHING SUITES;  Service: Obstetrics;  Laterality: Bilateral;  . DILATION AND CURETTAGE OF UTERUS    . THERAPEUTIC  ABORTION    . TUBAL LIGATION      OB History    Gravida  5   Para  2   Term  1   Preterm  1   AB  3   Living  3     SAB  2   IAB  1   Ectopic      Multiple  1   Live Births  3        Obstetric Comments  D&C         Home Medications    Prior to Admission medications   Medication Sig Start Date End Date Taking? Authorizing Provider  benzonatate (TESSALON) 100 MG capsule Take 1 capsule (100 mg total) by mouth every 8 (eight) hours. 04/22/20  Yes Rhys Martini, PA-C  promethazine-dextromethorphan (PROMETHAZINE-DM) 6.25-15 MG/5ML syrup Take 5 mLs by mouth 4 (four) times daily as needed for cough. 04/22/20  Yes Rhys Martini, PA-C    Family History Family History  Problem Relation Age of Onset  . Diabetes Mother   . Diabetes Maternal Grandmother   . Hypertension Maternal Grandmother   . Cancer Maternal Grandmother        colon cancer, brain tumour  . Other Neg Hx     Social History Social History   Tobacco Use  . Smoking status: Former Smoker    Packs/day: 0.25    Types: Cigarettes    Quit date: 08/17/2016    Years since quitting: 3.6  . Smokeless tobacco:  Former Clinical biochemist  . Vaping Use: Never used  Substance Use Topics  . Alcohol use: No    Alcohol/week: 0.0 standard drinks  . Drug use: No    Comment: last use begin of preg     Allergies   Patient has no known allergies.   Review of Systems Review of Systems  Constitutional: Negative for appetite change, chills and fever.  HENT: Positive for congestion. Negative for ear pain, rhinorrhea, sinus pressure, sinus pain and sore throat.   Eyes: Negative for redness and visual disturbance.  Respiratory: Positive for cough. Negative for chest tightness, shortness of breath and wheezing.   Cardiovascular: Negative for chest pain and palpitations.  Gastrointestinal: Negative for abdominal pain, constipation, diarrhea, nausea and vomiting.  Genitourinary: Negative for dysuria, frequency  and urgency.  Musculoskeletal: Negative for myalgias.  Neurological: Positive for dizziness. Negative for weakness and headaches.  Psychiatric/Behavioral: Negative for confusion.  All other systems reviewed and are negative.    Physical Exam Triage Vital Signs ED Triage Vitals  Enc Vitals Group     BP      Pulse      Resp      Temp      Temp src      SpO2      Weight      Height      Head Circumference      Peak Flow      Pain Score      Pain Loc      Pain Edu?      Excl. in GC?    Orthostatic VS for the past 24 hrs:  BP- Lying Pulse- Lying BP- Sitting Pulse- Sitting Pulse- Standing at 0 minutes  04/22/20 2017 117/80 99 122/81 101 125    Updated Vital Signs BP 113/79 (BP Location: Left Arm)   Pulse (!) 108   Temp (!) 101.7 F (38.7 C) (Oral)   Resp 20   LMP 04/13/2020   SpO2 96%   Visual Acuity Right Eye Distance:   Left Eye Distance:   Bilateral Distance:    Right Eye Near:   Left Eye Near:    Bilateral Near:     Physical Exam Vitals reviewed.  Constitutional:      General: She is not in acute distress.    Appearance: Normal appearance. She is not ill-appearing.  HENT:     Head: Normocephalic and atraumatic.     Right Ear: Hearing, tympanic membrane, ear canal and external ear normal. No swelling or tenderness. There is no impacted cerumen. No mastoid tenderness. Tympanic membrane is not perforated, erythematous, retracted or bulging.     Left Ear: Hearing, tympanic membrane, ear canal and external ear normal. No swelling or tenderness. There is no impacted cerumen. No mastoid tenderness. Tympanic membrane is not perforated, erythematous, retracted or bulging.     Nose:     Right Sinus: No maxillary sinus tenderness or frontal sinus tenderness.     Left Sinus: No maxillary sinus tenderness or frontal sinus tenderness.     Mouth/Throat:     Mouth: Mucous membranes are moist.     Pharynx: Uvula midline. No oropharyngeal exudate or posterior oropharyngeal  erythema.     Tonsils: No tonsillar exudate.  Cardiovascular:     Rate and Rhythm: Normal rate and regular rhythm.     Heart sounds: Normal heart sounds.     Comments: Calves are symmetric and nontender, negative homan's sign Pulmonary:  Breath sounds: Normal breath sounds and air entry. No wheezing, rhonchi or rales.  Chest:     Chest wall: No tenderness.  Abdominal:     General: Abdomen is flat. Bowel sounds are normal.     Tenderness: There is no abdominal tenderness. There is no guarding or rebound.  Musculoskeletal:     Right lower leg: No edema.     Left lower leg: No edema.  Lymphadenopathy:     Cervical: No cervical adenopathy.  Neurological:     General: No focal deficit present.     Mental Status: She is alert and oriented to person, place, and time.     Cranial Nerves: Cranial nerves are intact.     Sensory: Sensation is intact.     Motor: Motor function is intact. No weakness.     Comments: Significant dizziness with going from sitting to standing  Psychiatric:        Attention and Perception: Attention and perception normal.        Mood and Affect: Mood and affect normal.        Behavior: Behavior normal. Behavior is cooperative.        Thought Content: Thought content normal.        Judgment: Judgment normal.      UC Treatments / Results  Labs (all labs ordered are listed, but only abnormal results are displayed) Labs Reviewed  SARS CORONAVIRUS 2 (TAT 6-24 HRS) - Abnormal; Notable for the following components:      Result Value   SARS Coronavirus 2 POSITIVE (*)    All other components within normal limits    EKG   Radiology No results found.  Procedures Procedures (including critical care time)  Medications Ordered in UC Medications  acetaminophen (TYLENOL) tablet 650 mg (650 mg Oral Given 04/22/20 1954)    Initial Impression / Assessment and Plan / UC Course  I have reviewed the triage vital signs and the nursing notes.  Pertinent labs &  imaging results that were available during my care of the patient were reviewed by me and considered in my medical decision making (see chart for details).     Unable to collect orthostatic vitals due to significant dizziness with standing. Pt is here transported by mother. She is otherwise hemodynamically stable, without chest pain, high fevers, calf swelling, etc. Will discharge home with strict return precautions - worsening of dizziness, chest pain, shortness of breath, confusion, fevers >103 not reduced by tylenol/ibuprofen, calf pain/swelling, etc. Patient verbalizes understanding and agreement. Verbalizes she will not drive herself home.   Wells score 1.5 due to history of PE. Calves are symmetric and nontender, negative homan's sign  Covid test sent today. Isolation precautions per CDC guidelines until negative result. Symptomatic relief with OTC Mucinex, Nyquil, etc. Also promethazine DM and tessalon as below. Return precautions- new/worsening fevers/chills, shortness of breath, chest pain, abd pain, etc.   Stressed the importance of ambulating frequently to prevent clotting. Seek immediate medical attention if calf pain/swelling, racing heart, chest pain, shortness of breath, etc.   Final Clinical Impressions(s) / UC Diagnoses   Final diagnoses:  Acute upper respiratory infection  History of maternal pulmonary embolus  Viral upper respiratory tract infection     Discharge Instructions     -Tessalon as needed for cough. Take one pill up to 3x daily (every 8 hours) -Promethazine DM cough syrup for congestion/cough. This could make you drowsy, so take at night before bed. -For fevers/chills, body aches, headaches- use  Tylenol and Ibuprofen. You can alternate these for maximum effect. Use up to 3000mg  Tylenol daily and 3200mg  Ibuprofen daily. Make sure to take ibuprofen with food. Check the bottle of ibuprofen/tylenol for specific dosage instructions. -Head straight to ED if you  experience swelling in your legs; shortness of breath; chest pain; etc.   We are currently awaiting result of your PCR covid-19 test. This typically comes back in 1-2 days. We'll call you if the result is positive. Otherwise, the result will be sent electronically to your MyChart. You can also call this clinic and ask for your result via telephone.   Please isolate at home while awaiting these results. If your test is positive for Covid-19, continue to isolate at home for 5 days if you have mild symptoms, or a total of 10 days from symptom onset if you have more severe symptoms. If you quarantine for a shorter period of time (i.e. 5 days), make sure to wear a mask until day 10 of symptoms. Treat your symptoms at home with OTC remedies like tylenol/ibuprofen, mucinex, nyquil, etc. Seek medical attention if you develop high fevers, chest pain, shortness of breath, ear pain, facial pain, etc. Make sure to get up and move around every 2-3 hours while convalescing to help prevent blood clots. Drink plenty of fluids, and rest as much as possible.     ED Prescriptions    Medication Sig Dispense Auth. Provider   benzonatate (TESSALON) 100 MG capsule Take 1 capsule (100 mg total) by mouth every 8 (eight) hours. 21 capsule , PA-C   promethazine-dextromethorphan (PROMETHAZINE-DM) 6.25-15 MG/5ML syrup Take 5 mLs by mouth 4 (four) times daily as needed for cough. 118 mL Rhys Martini, PA-C     PDMP not reviewed this encounter.   06-11-1992, PA-C 04/22/20 1916    Rhys Martini, PA-C 04/23/20 1018

## 2020-04-22 NOTE — ED Provider Notes (Incomplete Revision)
[facility]    CSN: [id number] Arrival date & time: 04/22/20  1809      History   Chief Complaint No chief complaint on file.   HPI [name] is a 30 y.o. female Presenting for URI symptoms for 2 days. History of hypertension, PE during pregnancy. Presenting with nonproductive cough, dizziness, congestion. History of one PE while pregnant, no issues with this since then. Pt is not on blood thinner. Denies swelling or pain in calves, denies shortness of breath. Denies fevers/chills, n/v/d, shortness of breath, chest pain,  facial pain, teeth pain, headaches, sore throat, loss of taste/smell, swollen lymph nodes, ear pain.  Denies chest pain, shortness of breath, confusion, high fevers.    HPI  Past Medical History:  Diagnosis Date  . Bacterial vaginosis 08/15/2016  . Headache(784.0)   . Hypertension   . Incompetent cervix   . Preeclampsia, third trimester 03/21/2017  . Pregnancy induced hypertension   . Preterm labor   . Pulmonary emboli (HCC)    RIGHT LUNG  . Pulmonary embolism during pregnancy, antepartum   . Urinary tract infection     Patient Active Problem List   Diagnosis Date Noted  . TOBACCO USE, QUIT 01/03/2009  . OBESITY 11/30/2008    Past Surgical History:  Procedure Laterality Date  . BREAST FIBROADENOMA SURGERY  2006-7.   Removal;right  . CESAREAN SECTION    . CESAREAN SECTION WITH BILATERAL TUBAL LIGATION Bilateral 04/19/2017   Procedure: CESAREAN SECTION WITH BILATERAL TUBAL LIGATION;  Surgeon: Lesly Dukes, MD;  Location: [facility] BIRTHING SUITES;  Service: Obstetrics;  Laterality: Bilateral;  . DILATION AND CURETTAGE OF UTERUS    . THERAPEUTIC ABORTION    . TUBAL LIGATION      OB History    Gravida  5   Para  2   Term  1   Preterm  1   AB  3   Living  3     SAB  2   IAB  1   Ectopic      Multiple  1   Live Births  3        Obstetric Comments  D&C         Home Medications    Prior to Admission  medications   Medication Sig Start Date End Date Taking? Authorizing Provider  benzonatate (TESSALON) 100 MG capsule Take 1 capsule (100 mg total) by mouth every 8 (eight) hours. 04/25/18   Fayrene Helper, PA-C  doxycycline (VIBRAMYCIN) 100 MG capsule Take 1 capsule (100 mg total) by mouth 2 (two) times daily. One po bid x 7 days 04/25/18   Fayrene Helper, PA-C    Family History Family History  Problem Relation Age of Onset  . Diabetes Mother   . Diabetes Maternal Grandmother   . Hypertension Maternal Grandmother   . Cancer Maternal Grandmother        colon cancer, brain tumour  . Other Neg Hx     Social History Social History   Tobacco Use  . Smoking status: Former Smoker    Packs/day: 0.25    Types: Cigarettes    Quit date: 08/17/2016    Years since quitting: 3.6  . Smokeless tobacco: Former Clinical biochemist  . Vaping Use: Never used  Substance Use Topics  . Alcohol use: No    Alcohol/week: 0.0 standard drinks  . Drug use: No    Comment: last use begin of preg     Allergies  Patient has no known allergies.   Review of Systems Review of Systems  Constitutional: Negative for appetite change, chills and fever.  HENT: Positive for congestion. Negative for ear pain, rhinorrhea, sinus pressure, sinus pain and sore throat.   Eyes: Negative for redness and visual disturbance.  Respiratory: Positive for cough. Negative for chest tightness, shortness of breath and wheezing.   Cardiovascular: Negative for chest pain and palpitations.  Gastrointestinal: Negative for abdominal pain, constipation, diarrhea, nausea and vomiting.  Genitourinary: Negative for dysuria, frequency and urgency.  Musculoskeletal: Negative for myalgias.  Neurological: Negative for dizziness, weakness and headaches.  Psychiatric/Behavioral: Negative for confusion.  All other systems reviewed and are negative.    Physical Exam Triage Vital Signs ED Triage Vitals  Enc Vitals Group     BP      Pulse       Resp      Temp      Temp src      SpO2      Weight      Height      Head Circumference      Peak Flow      Pain Score      Pain Loc      Pain Edu?      Excl. in GC?    No data found.  Updated Vital Signs There were no vitals taken for this visit.  Visual Acuity Right Eye Distance:   Left Eye Distance:   Bilateral Distance:    Right Eye Near:   Left Eye Near:    Bilateral Near:     Physical Exam Vitals reviewed.  Constitutional:      General: She is not in acute distress.    Appearance: Normal appearance. She is not ill-appearing.  HENT:     Head: Normocephalic and atraumatic.     Right Ear: Hearing, tympanic membrane, ear canal and external ear normal. No swelling or tenderness. There is no impacted cerumen. No mastoid tenderness. Tympanic membrane is not perforated, erythematous, retracted or bulging.     Left Ear: Hearing, tympanic membrane, ear canal and external ear normal. No swelling or tenderness. There is no impacted cerumen. No mastoid tenderness. Tympanic membrane is not perforated, erythematous, retracted or bulging.     Nose:     Right Sinus: No maxillary sinus tenderness or frontal sinus tenderness.     Left Sinus: No maxillary sinus tenderness or frontal sinus tenderness.     Mouth/Throat:     Mouth: Mucous membranes are moist.     Pharynx: Uvula midline. No oropharyngeal exudate or posterior oropharyngeal erythema.     Tonsils: No tonsillar exudate.  Cardiovascular:     Rate and Rhythm: Normal rate and regular rhythm.     Heart sounds: Normal heart sounds.     Comments: Calves are symmetric and nontender, negative homan's sign Pulmonary:     Breath sounds: Normal breath sounds and air entry. No wheezing, rhonchi or rales.  Chest:     Chest wall: No tenderness.  Abdominal:     General: Abdomen is flat. Bowel sounds are normal.     Tenderness: There is no abdominal tenderness. There is no guarding or rebound.  Musculoskeletal:     Right lower leg:  No edema.     Left lower leg: No edema.  Lymphadenopathy:     Cervical: No cervical adenopathy.  Neurological:     General: No focal deficit present.     Mental Status: She is alert  and oriented to person, place, and time.  Psychiatric:        Attention and Perception: Attention and perception normal.        Mood and Affect: Mood and affect normal.        Behavior: Behavior normal. Behavior is cooperative.        Thought Content: Thought content normal.        Judgment: Judgment normal.      UC Treatments / Results  Labs (all labs ordered are listed, but only abnormal results are displayed) Labs Reviewed - No data to display  EKG   Radiology No results found.  Procedures Procedures (including critical care time)  Medications Ordered in UC Medications - No data to display  Initial Impression / Assessment and Plan / UC Course  I have reviewed the triage vital signs and the nursing notes.  Pertinent labs & imaging results that were available during my care of the patient were reviewed by me and considered in my medical decision making (see chart for details).     Wells score 1.5 due to history of PE. Calves are symmetric and nontender, negative homan's sign  Covid test sent today. Isolation precautions per CDC guidelines until negative result. Symptomatic relief with OTC Mucinex, Nyquil, etc. Return precautions- new/worsening fevers/chills, shortness of breath, chest pain, abd pain, etc.   Stressed the importance of ambulating frequently to prevent clotting. Seek immediate medical attention if calf pain/swelling, racing heart, chest pain, shortness of breath, etc.   Final Clinical Impressions(s) / UC Diagnoses   Final diagnoses:  None   Discharge Instructions   None    ED Prescriptions    None     PDMP not reviewed this encounter.   Rhys Martini, PA-C 04/22/20 1916

## 2020-04-23 LAB — SARS CORONAVIRUS 2 (TAT 6-24 HRS): SARS Coronavirus 2: POSITIVE — AB

## 2020-04-28 ENCOUNTER — Other Ambulatory Visit: Payer: Self-pay

## 2020-04-28 ENCOUNTER — Encounter (HOSPITAL_COMMUNITY): Payer: Self-pay

## 2020-04-28 ENCOUNTER — Ambulatory Visit (HOSPITAL_COMMUNITY)
Admission: EM | Admit: 2020-04-28 | Discharge: 2020-04-28 | Disposition: A | Discharge status: Home / Self Care | Payer: Medicaid Other | Attending: Family Medicine | Admitting: Family Medicine

## 2020-04-28 DIAGNOSIS — K0889 Other specified disorders of teeth and supporting structures: Secondary | ICD-10-CM | POA: Diagnosis not present

## 2020-04-28 MED ORDER — PENICILLIN V POTASSIUM 500 MG PO TABS: 500.0000 mg | ORAL_TABLET | Freq: Three times a day (TID) | ORAL | 0 refills | Status: DC

## 2020-04-28 MED ORDER — HYDROCODONE-ACETAMINOPHEN 5-325 MG PO TABS: 1.0000 | ORAL_TABLET | Freq: Four times a day (QID) | ORAL | 0 refills | Status: DC | PRN

## 2020-04-28 NOTE — ED Provider Notes (Signed)
[facility]   [id number] 04/28/20 Arrival Time: 0911  ASSESSMENT & PLAN:  1. Pain, dental     No sign of abscess requiring I&D at this time. Discussed.  Begin: Meds ordered this encounter  Medications  . penicillin v potassium (VEETID) 500 MG tablet    Sig: Take 1 tablet (500 mg total) by mouth 3 (three) times daily.    Dispense:  30 tablet    Refill:  0  . HYDROcodone-acetaminophen (NORCO/VICODIN) 5-325 MG tablet    Sig: Take 1 tablet by mouth every 6 (six) hours as needed for moderate pain or severe pain.    Dispense:  8 tablet    Refill:  0    [place] Controlled Substances Registry consulted for this patient. I feel the risk/benefit ratio today is favorable for proceeding with this prescription for a controlled substance. Medication sedation precautions given.  Dental resource written instructions given. She will schedule dental evaluation as soon as possible if not improving over the next 24-48 hours.  Reviewed expectations re: course of current medical issues. Questions answered. Outlined signs and symptoms indicating need for more acute intervention. Patient verbalized understanding. After Visit Summary given.   SUBJECTIVE:  [name] is a 30 y.o. female who reports gradual onset of left lower dental pain described as aching/throbbing. Present for 2 days. Fever: absent. Tolerating PO intake but reports pain with chewing. Normal swallowing. She does not see a dentist regularly. No neck swelling or pain. OTC analgesics without relief. Is COVID +.  OBJECTIVE: Vitals:   04/28/20 1038 04/28/20 1039  BP:  (!) 147/83  Pulse: 80   Resp: 18   Temp: 98.5 F (36.9 C)   TempSrc: Oral   SpO2: 100%     General appearance: alert; no distress HENT: normocephalic; atraumatic; dentition: fair; left lower gums without areas of fluctuance, drainage, or bleeding and with tenderness to palpation; normal jaw movement without difficulty Neck: supple without LAD; FROM;  trachea midline Lungs: normal respirations; unlabored; speaks full sentences without difficulty Skin: warm and dry Psychological: alert and cooperative; normal mood and affect  No Known Allergies  Past Medical History:  Diagnosis Date  . Bacterial vaginosis 08/15/2016  . Headache(784.0)   . Hypertension   . Incompetent cervix   . Preeclampsia, third trimester 03/21/2017  . Pregnancy induced hypertension   . Preterm labor   . Pulmonary emboli (HCC)    RIGHT LUNG  . Pulmonary embolism during pregnancy, antepartum   . Urinary tract infection    Social History   Socioeconomic History  . Marital status: Single    Spouse name: Not on file  . Number of children: Not on file  . Years of education: Not on file  . Highest education level: Not on file  Occupational History  . Occupation: None    Employer: UNEMPLOYED  Tobacco Use  . Smoking status: Former Smoker    Packs/day: 0.25    Types: Cigarettes    Quit date: 08/17/2016    Years since quitting: 3.6  . Smokeless tobacco: Former Clinical biochemist  . Vaping Use: Never used  Substance and Sexual Activity  . Alcohol use: No    Alcohol/week: 0.0 standard drinks  . Drug use: No    Comment: last use begin of preg  . Sexual activity: Not Currently    Birth control/protection: Surgical  Other Topics Concern  . Not on file  Social History Narrative  . Not on file   Social Determinants of  Health   Financial Resource Strain: Not on file  Food Insecurity: Not on file  Transportation Needs: Not on file  Physical Activity: Not on file  Stress: Not on file  Social Connections: Not on file  Intimate Partner Violence: Not on file   Family History  Problem Relation Age of Onset  . Diabetes Mother   . Diabetes Maternal Grandmother   . Hypertension Maternal Grandmother   . Cancer Maternal Grandmother        colon cancer, brain tumour  . Other Neg Hx    Past Surgical History:  Procedure Laterality Date  . BREAST FIBROADENOMA  SURGERY  2006-7.   Removal;right  . CESAREAN SECTION    . CESAREAN SECTION WITH BILATERAL TUBAL LIGATION Bilateral 04/19/2017   Procedure: CESAREAN SECTION WITH BILATERAL TUBAL LIGATION;  Surgeon: Lesly Dukes, MD;  Location: [facility] BIRTHING SUIT[facility];  Service: Obstetrics;  Laterality: Bilateral;  . DILATION AND CURETTAGE OF UTERUS    . THERAPEUTIC ABORTION    . TUBAL LIGATION       Mardella Layman, MD 04/28/20 1109

## 2020-04-28 NOTE — ED Triage Notes (Signed)
Pt c/o left side jaw pain x 2 days. Pt states she has not taken OTC pain medication. She states she noticed an abscess on the inside and outside of her mouth.

## 2020-04-28 NOTE — Discharge Instructions (Signed)

## 2020-06-08 ENCOUNTER — Ambulatory Visit: Payer: Medicaid Other | Admitting: Family Medicine

## 2020-08-01 ENCOUNTER — Ambulatory Visit (HOSPITAL_COMMUNITY)
Admission: EM | Admit: 2020-08-01 | Discharge: 2020-08-01 | Disposition: A | Discharge status: Home / Self Care | Payer: Medicaid Other | Attending: Physician Assistant | Admitting: Physician Assistant

## 2020-08-01 ENCOUNTER — Encounter (HOSPITAL_COMMUNITY): Payer: Self-pay

## 2020-08-01 ENCOUNTER — Other Ambulatory Visit: Payer: Self-pay

## 2020-08-01 DIAGNOSIS — K0889 Other specified disorders of teeth and supporting structures: Secondary | ICD-10-CM | POA: Diagnosis not present

## 2020-08-01 MED ORDER — PENICILLIN V POTASSIUM 500 MG PO TABS: 500.0000 mg | ORAL_TABLET | Freq: Three times a day (TID) | ORAL | 0 refills | Status: DC

## 2020-08-01 MED ORDER — HYDROCODONE-ACETAMINOPHEN 5-325 MG PO TABS: 1.0000 | ORAL_TABLET | Freq: Four times a day (QID) | ORAL | 0 refills | Status: DC | PRN

## 2020-08-01 NOTE — ED Triage Notes (Signed)
Pt presents with c/o left lower dental pain that began yesterday

## 2020-08-01 NOTE — Discharge Instructions (Signed)
Take medication as prescribed Follow up with dental clinic as soon as you can

## 2020-08-01 NOTE — ED Provider Notes (Signed)
[facility]    CSN: [id number] Arrival date & time: 08/01/20  1658      History   Chief Complaint Chief Complaint  Patient presents with  . Dental Pain    HPI [name] is a 30 y.o. female.   Pt complains of left lower dental pain that became worse yesterday, chronic dental problems.  She reports a left lower tooth broke more over the last few days.  She has been trying to get in to see a dentist and reports no availability.  She has taken tylenol with minimal relief.  Denies fevers at home.      Past Medical History:  Diagnosis Date  . Bacterial vaginosis 08/15/2016  . Headache(784.0)   . Hypertension   . Incompetent cervix   . Preeclampsia, third trimester 03/21/2017  . Pregnancy induced hypertension   . Preterm labor   . Pulmonary emboli (HCC)    RIGHT LUNG  . Pulmonary embolism during pregnancy, antepartum   . Urinary tract infection     Patient Active Problem List   Diagnosis Date Noted  . TOBACCO USE, QUIT 01/03/2009  . OBESITY 11/30/2008    Past Surgical History:  Procedure Laterality Date  . BREAST FIBROADENOMA SURGERY  2006-7.   Removal;right  . CESAREAN SECTION    . CESAREAN SECTION WITH BILATERAL TUBAL LIGATION Bilateral 04/19/2017   Procedure: CESAREAN SECTION WITH BILATERAL TUBAL LIGATION;  Surgeon: Lesly Dukes, MD;  Location: [facility] BIRTHING SUITES;  Service: Obstetrics;  Laterality: Bilateral;  . DILATION AND CURETTAGE OF UTERUS    . THERAPEUTIC ABORTION    . TUBAL LIGATION      OB History    Gravida  5   Para  2   Term  1   Preterm  1   AB  3   Living  3     SAB  2   IAB  1   Ectopic      Multiple  1   Live Births  3        Obstetric Comments  D&C         Home Medications    Prior to Admission medications   Medication Sig Start Date End Date Taking? Authorizing Provider  benzonatate (TESSALON) 100 MG capsule Take 1 capsule (100 mg total) by mouth every 8 (eight) hours. 04/22/20   Rhys Martini,  PA-C  HYDROcodone-acetaminophen (NORCO/VICODIN) 5-325 MG tablet Take 1 tablet by mouth every 6 (six) hours as needed for moderate pain or severe pain. 08/01/20   Jodell Cipro, PA-C  penicillin v potassium (VEETID) 500 MG tablet Take 1 tablet (500 mg total) by mouth 3 (three) times daily. 08/01/20   Jodell Cipro, PA-C  promethazine-dextromethorphan (PROMETHAZINE-DM) 6.25-15 MG/5ML syrup Take 5 mLs by mouth 4 (four) times daily as needed for cough. 04/22/20   Rhys Martini, PA-C    Family History Family History  Problem Relation Age of Onset  . Diabetes Mother   . Diabetes Maternal Grandmother   . Hypertension Maternal Grandmother   . Cancer Maternal Grandmother        colon cancer, brain tumour  . Other Neg Hx     Social History Social History   Tobacco Use  . Smoking status: Former Smoker    Packs/day: 0.25    Types: Cigarettes    Quit date: 08/17/2016    Years since quitting: 3.9  . Smokeless tobacco: Former Clinical biochemist  . Vaping Use: Never used  Substance Use Topics  . Alcohol use: No    Alcohol/week: 0.0 standard drinks  . Drug use: No    Comment: last use begin of preg     Allergies   Patient has no known allergies.   Review of Systems Review of Systems  Constitutional: Negative for chills and fever.  HENT: Positive for dental problem. Negative for ear pain and sore throat.   Eyes: Negative for pain and visual disturbance.  Respiratory: Negative for cough and shortness of breath.   Cardiovascular: Negative for chest pain and palpitations.  Gastrointestinal: Negative for abdominal pain and vomiting.  Genitourinary: Negative for dysuria and hematuria.  Musculoskeletal: Negative for arthralgias and back pain.  Skin: Negative for color change and rash.  Neurological: Negative for seizures and syncope.  All other systems reviewed and are negative.    Physical Exam Triage Vital Signs ED Triage Vitals  Enc Vitals Group     BP 08/01/20 1752 128/80      Pulse Rate 08/01/20 1752 81     Resp 08/01/20 1752 20     Temp 08/01/20 1752 98.4 F (36.9 C)     Temp src --      SpO2 08/01/20 1752 100 %     Weight --      Height --      Head Circumference --      Peak Flow --      Pain Score 08/01/20 1750 7     Pain Loc --      Pain Edu? --      Excl. in GC? --    No data found.  Updated Vital Signs BP 128/80   Pulse 81   Temp 98.4 F (36.9 C)   Resp 20   LMP 06/28/2020 (Approximate)   SpO2 100%   Visual Acuity Right Eye Distance:   Left Eye Distance:   Bilateral Distance:    Right Eye Near:   Left Eye Near:    Bilateral Near:     Physical Exam Vitals and nursing note reviewed.  Constitutional:      General: She is not in acute distress.    Appearance: She is well-developed.  HENT:     Head: Normocephalic and atraumatic.     Mouth/Throat:     Dentition: Abnormal dentition. Dental caries present. No dental abscesses or gum lesions.   Eyes:     Conjunctiva/sclera: Conjunctivae normal.  Cardiovascular:     Rate and Rhythm: Normal rate and regular rhythm.     Heart sounds: No murmur heard.   Pulmonary:     Effort: Pulmonary effort is normal. No respiratory distress.     Breath sounds: Normal breath sounds.  Abdominal:     Palpations: Abdomen is soft.     Tenderness: There is no abdominal tenderness.  Musculoskeletal:     Cervical back: Neck supple.  Skin:    General: Skin is warm and dry.  Neurological:     Mental Status: She is alert.      UC Treatments / Results  Labs (all labs ordered are listed, but only abnormal results are displayed) Labs Reviewed - No data to display  EKG   Radiology No results found.  Procedures Procedures (including critical care time)  Medications Ordered in UC Medications - No data to display  Initial Impression / Assessment and Plan / UC Course  I have reviewed the triage vital signs and the nursing notes.  Pertinent labs & imaging results that were available  during my care of the patient were reviewed by me and considered in my medical decision making (see chart for details).     Advised to follow up with dentist as soon as possible.  List of dental clinics given.  Antibiotics and hydrocodone prescribed. Return precautions discussed.  Final Clinical Impressions(s) / UC Diagnoses   Final diagnoses:  Pain, dental     Discharge Instructions     Take medication as prescribed Follow up with dental clinic as soon as you can    ED Prescriptions    Medication Sig Dispense Auth. Provider   penicillin v potassium (VEETID) 500 MG tablet Take 1 tablet (500 mg total) by mouth 3 (three) times daily. 30 tablet Calia Napp, PA-C   HYDROcodone-acetaminophen (NORCO/VICODIN) 5-325 MG tablet Take 1 tablet by mouth every 6 (six) hours as needed for moderate pain or severe pain. 8 tablet Jodell Cipro, PA-C     I have reviewed the PDMP during this encounter.   Jodell Cipro, PA-C 08/01/20 1821

## 2020-09-01 ENCOUNTER — Encounter: Payer: Self-pay | Admitting: *Deleted

## 2020-10-17 ENCOUNTER — Emergency Department
Admission: EM | Admit: 2020-10-17 | Discharge: 2020-10-17 | Disposition: A | Discharge status: Home / Self Care | Payer: Medicaid Other | Attending: Emergency Medicine | Admitting: Emergency Medicine

## 2020-10-17 ENCOUNTER — Other Ambulatory Visit: Payer: Self-pay

## 2020-10-17 DIAGNOSIS — Z87891 Personal history of nicotine dependence: Secondary | ICD-10-CM | POA: Diagnosis not present

## 2020-10-17 DIAGNOSIS — I10 Essential (primary) hypertension: Secondary | ICD-10-CM | POA: Diagnosis not present

## 2020-10-17 DIAGNOSIS — H9201 Otalgia, right ear: Secondary | ICD-10-CM | POA: Insufficient documentation

## 2020-10-17 MED ORDER — CIPROFLOXACIN-DEXAMETHASONE 0.3-0.1 % OT SUSP: 4.0000 [drp] | Freq: Two times a day (BID) | OTIC | 0 refills | Status: AC

## 2020-10-17 MED ORDER — FLUTICASONE PROPIONATE 50 MCG/ACT NA SUSP: 1.0000 | Freq: Every day | NASAL | 2 refills | Status: DC

## 2020-10-17 MED ORDER — CIPROFLOXACIN-DEXAMETHASONE 0.3-0.1 % OT SUSP: 4.0000 [drp] | Freq: Two times a day (BID) | OTIC | 0 refills | Status: DC

## 2020-10-17 NOTE — Discharge Instructions (Addendum)
You can apply 4 drops of Ciprodex to right ear twice daily for the next 7 days. Apply 1 spray of Flonase each side for the next 7 days.

## 2020-10-17 NOTE — ED Provider Notes (Signed)
[facility]  ____________________________________________  Time seen: Approximately 8:27 PM  I have reviewed the triage vital signs and the nursing notes.   HISTORY  Chief Complaint Otalgia   Historian Patient     HPI [name] is a 30 y.o. female presents to the emergency department with right ear pain.  Pain is reproducible with palpation of the tragus.  No discharge from the right ear.  Patient denies recent otitis media.  No blurry vision, headache or other new constitutional measures   Past Medical History:  Diagnosis Date   Bacterial vaginosis 08/15/2016   Headache(784.0)    Hypertension    Incompetent cervix    Preeclampsia, third trimester 03/21/2017   Pregnancy induced hypertension    Preterm labor    Pulmonary emboli (HCC)    RIGHT LUNG   Pulmonary embolism during pregnancy, antepartum    Urinary tract infection      Immunizations up to date:  Yes.     Past Medical History:  Diagnosis Date   Bacterial vaginosis 08/15/2016   Headache(784.0)    Hypertension    Incompetent cervix    Preeclampsia, third trimester 03/21/2017   Pregnancy induced hypertension    Preterm labor    Pulmonary emboli (HCC)    RIGHT LUNG   Pulmonary embolism during pregnancy, antepartum    Urinary tract infection     Patient Active Problem List   Diagnosis Date Noted   TOBACCO USE, QUIT 01/03/2009   OBESITY 11/30/2008    Past Surgical History:  Procedure Laterality Date   BREAST FIBROADENOMA SURGERY  2006-7.   Removal;right   CESAREAN SECTION     CESAREAN SECTION WITH BILATERAL TUBAL LIGATION Bilateral 04/19/2017   Procedure: CESAREAN SECTION WITH BILATERAL TUBAL LIGATION;  Surgeon: Lesly Dukes, MD;  Location: [facility] BIRTHING SUITES;  Service: Obstetrics;  Laterality: Bilateral;   DILATION AND CURETTAGE OF UTERUS     THERAPEUTIC ABORTION     TUBAL LIGATION      Prior to Admission medications   Medication Sig Start Date End Date Taking?  Authorizing Provider  fluticasone (FLONASE) 50 MCG/ACT nasal spray Place 1 spray into both nostrils daily. 10/17/20 10/17/21 Yes Pia Mau M, PA-C  benzonatate (TESSALON) 100 MG capsule Take 1 capsule (100 mg total) by mouth every 8 (eight) hours. 04/22/20   Rhys Martini, PA-C  ciprofloxacin-dexamethasone (CIPRODEX) OTIC suspension Place 4 drops into the right ear 2 (two) times daily for 7 days. 10/17/20 10/24/20  Orvil Feil, PA-C  HYDROcodone-acetaminophen (NORCO/VICODIN) 5-325 MG tablet Take 1 tablet by mouth every 6 (six) hours as needed for moderate pain or severe pain. 08/01/20   Jodell Cipro, PA-C  penicillin v potassium (VEETID) 500 MG tablet Take 1 tablet (500 mg total) by mouth 3 (three) times daily. 08/01/20   Jodell Cipro, PA-C  promethazine-dextromethorphan (PROMETHAZINE-DM) 6.25-15 MG/5ML syrup Take 5 mLs by mouth 4 (four) times daily as needed for cough. 04/22/20   Rhys Martini, PA-C    Allergies Patient has no known allergies.  Family History  Problem Relation Age of Onset   Diabetes Mother    Diabetes Maternal Grandmother    Hypertension Maternal Grandmother    Cancer Maternal Grandmother        colon cancer, brain tumour   Other Neg Hx     Social History Social History   Tobacco Use   Smoking status: Former    Packs/day: 0.25    Pack years: 0.00    Types: Cigarettes  Quit date: 08/17/2016    Years since quitting: 4.1   Smokeless tobacco: Former  Building services engineer Use: Never used  Substance Use Topics   Alcohol use: No    Alcohol/week: 0.0 standard drinks   Drug use: No    Comment: last use begin of preg     Review of Systems  Constitutional: No fever/chills Eyes:  No discharge ENT: Patient has right ear pain.  Respiratory: no cough. No SOB/ use of accessory muscles to breath Gastrointestinal:   No nausea, no vomiting.  No diarrhea.  No constipation. Musculoskeletal: Negative for musculoskeletal pain. Skin: Negative for rash,  abrasions, lacerations, ecchymosis.    ____________________________________________   PHYSICAL EXAM:  VITAL SIGNS: ED Triage Vitals  Enc Vitals Group     BP 10/17/20 2014 (!) 152/85     Pulse Rate 10/17/20 2014 75     Resp 10/17/20 2014 20     Temp 10/17/20 2014 98.5 F (36.9 C)     Temp Source 10/17/20 2014 Oral     SpO2 10/17/20 2014 98 %     Weight 10/17/20 2016 275 lb (124.7 kg)     Height 10/17/20 2016 5\' 3"  (1.6 m)     Head Circumference --      Peak Flow --      Pain Score 10/17/20 2015 8     Pain Loc --      Pain Edu? --      Excl. in GC? --      Constitutional: Alert and oriented. Well appearing and in no acute distress. Eyes: Conjunctivae are normal. PERRL. EOMI. Head: Atraumatic. ENT:      Ears: Patient's right otalgia is reproducible with palpation of the tragus.  Middle ear is effused without evidence of otitis media.      Nose: No congestion/rhinnorhea.      Mouth/Throat: Mucous membranes are moist.  Neck: No stridor.  No cervical spine tenderness to palpation. Cardiovascular: Normal rate, regular rhythm. Normal S1 and S2.  Good peripheral circulation. Respiratory: Normal respiratory effort without tachypnea or retractions. Lungs CTAB. Good air entry to the bases with no decreased or absent breath sounds Gastrointestinal: Bowel sounds x 4 quadrants. Soft and nontender to palpation. No guarding or rigidity. No distention. Musculoskeletal: Full range of motion to all extremities. No obvious deformities noted Neurologic:  Normal for age. No gross focal neurologic deficits are appreciated.  Skin:  Skin is warm, dry and intact. No rash noted. Psychiatric: Mood and affect are normal for age. Speech and behavior are normal.   ____________________________________________   LABS (all labs ordered are listed, but only abnormal results are displayed)  Labs Reviewed - No data to  display ____________________________________________  EKG   ____________________________________________  RADIOLOGY   No results found.  ____________________________________________    PROCEDURES  Procedure(s) performed:     Procedures     Medications - No data to display   ____________________________________________   INITIAL IMPRESSION / ASSESSMENT AND PLAN / ED COURSE  Pertinent labs & imaging results that were available during my care of the patient were reviewed by me and considered in my medical decision making (see chart for details).      Assessment and plan Right ear pain 30 year old female presents to the emergency department with right-sided otalgia that is occurred for the past 4 to 5 days.  Patient had reproducible pain with palpation of the tragus.  History and physical exam findings suggest otitis externa.  Will treat with  Ciprodex twice daily for the next 7 days.  All patient questions were answered.     ____________________________________________  FINAL CLINICAL IMPRESSION(S) / ED DIAGNOSES  Final diagnoses:  Right ear pain      NEW MEDICATIONS STARTED DURING THIS VISIT:  ED Discharge Orders          Ordered    ciprofloxacin-dexamethasone (CIPRODEX) OTIC suspension  2 times daily,   Status:  Discontinued        10/17/20 2017    fluticasone (FLONASE) 50 MCG/ACT nasal spray  Daily        10/17/20 2018    ciprofloxacin-dexamethasone (CIPRODEX) OTIC suspension  2 times daily        10/17/20 2026                This chart was dictated using voice recognition software/Dragon. Despite best efforts to proofread, errors can occur which can change the meaning. Any change was purely unintentional.     Gasper Lloyd 10/17/20 2031    Willy Eddy, MD 10/18/20 2046

## 2020-10-17 NOTE — ED Triage Notes (Signed)
Pt in with co right earache and right sided head pain for few days.

## 2020-10-25 ENCOUNTER — Emergency Department (HOSPITAL_COMMUNITY)
Admission: EM | Admit: 2020-10-25 | Discharge: 2020-10-25 | Disposition: A | Discharge status: Home / Self Care | Payer: Medicaid Other | Attending: Emergency Medicine | Admitting: Emergency Medicine

## 2020-10-25 ENCOUNTER — Other Ambulatory Visit: Payer: Self-pay

## 2020-10-25 ENCOUNTER — Encounter (HOSPITAL_COMMUNITY): Payer: Self-pay

## 2020-10-25 DIAGNOSIS — R519 Headache, unspecified: Secondary | ICD-10-CM | POA: Diagnosis not present

## 2020-10-25 DIAGNOSIS — Z87891 Personal history of nicotine dependence: Secondary | ICD-10-CM | POA: Diagnosis not present

## 2020-10-25 DIAGNOSIS — M542 Cervicalgia: Secondary | ICD-10-CM | POA: Insufficient documentation

## 2020-10-25 DIAGNOSIS — H6991 Unspecified Eustachian tube disorder, right ear: Secondary | ICD-10-CM

## 2020-10-25 DIAGNOSIS — I10 Essential (primary) hypertension: Secondary | ICD-10-CM | POA: Insufficient documentation

## 2020-10-25 DIAGNOSIS — G44209 Tension-type headache, unspecified, not intractable: Secondary | ICD-10-CM

## 2020-10-25 DIAGNOSIS — H6981 Other specified disorders of Eustachian tube, right ear: Secondary | ICD-10-CM

## 2020-10-25 MED ORDER — LIDOCAINE 5 % EX PTCH: 1.0000 | MEDICATED_PATCH | CUTANEOUS | 0 refills | Status: DC

## 2020-10-25 MED ORDER — ACETAMINOPHEN 325 MG PO TABS
650.0000 mg | ORAL_TABLET | Freq: Once | ORAL | Status: AC
  Administered 2020-10-25: 650 mg via ORAL
  Filled 2020-10-25: qty 2

## 2020-10-25 MED ORDER — LIDOCAINE 5 % EX PTCH
1.0000 | MEDICATED_PATCH | CUTANEOUS | Status: DC
  Administered 2020-10-25: 1 via TRANSDERMAL
  Filled 2020-10-25: qty 1

## 2020-10-25 NOTE — ED Provider Notes (Signed)
[facility] EMERGENCY DEPARTMENT Provider Note   CSN: [id number] Arrival date & time: 10/25/20  1632     History Chief Complaint  Patient presents with   Ear Drainage    ALVARETTA EISENBERGER is a 30 y.o. female who presents with concern for 3 to 4 weeks of popping sensation in her abdomen "sounds like the ocean is there".  Additionally has had 2 weeks of posterior neck pain and posterior headache.  Denies any blurry or double vision.  Denies any discharge from the ear.  Was seen at [facility] recently and diagnosed with otitis externa and administered Ciprodex drops.  She states they may been helping her pain.  Denies any fevers or chills.  I personally read this patient's medical records.  She is history of hypertension, PE during pregnancy, no longer on anticoagulation.  HPI     Past Medical History:  Diagnosis Date   Bacterial vaginosis 08/15/2016   Headache(784.0)    Hypertension    Incompetent cervix    Preeclampsia, third trimester 03/21/2017   Pregnancy induced hypertension    Preterm labor    Pulmonary emboli (HCC)    RIGHT LUNG   Pulmonary embolism during pregnancy, antepartum    Urinary tract infection     Patient Active Problem List   Diagnosis Date Noted   TOBACCO USE, QUIT 01/03/2009   OBESITY 11/30/2008    Past Surgical History:  Procedure Laterality Date   BREAST FIBROADENOMA SURGERY  2006-7.   Removal;right   CESAREAN SECTION     CESAREAN SECTION WITH BILATERAL TUBAL LIGATION Bilateral 04/19/2017   Procedure: CESAREAN SECTION WITH BILATERAL TUBAL LIGATION;  Surgeon: Lesly Dukes, MD;  Location: [facility] BIRTHING SUITES;  Service: Obstetrics;  Laterality: Bilateral;   DILATION AND CURETTAGE OF UTERUS     THERAPEUTIC ABORTION     TUBAL LIGATION       OB History     Gravida  5   Para  2   Term  1   Preterm  1   AB  3   Living  3      SAB  2   IAB  1   Ectopic      Multiple  1   Live Births  3        Obstetric Comments   D&C         Family History  Problem Relation Age of Onset   Diabetes Mother    Diabetes Maternal Grandmother    Hypertension Maternal Grandmother    Cancer Maternal Grandmother        colon cancer, brain tumour   Other Neg Hx     Social History   Tobacco Use   Smoking status: Former    Packs/day: 0.25    Types: Cigarettes    Quit date: 08/17/2016    Years since quitting: 4.1   Smokeless tobacco: Former  Building services engineer Use: Never used  Substance Use Topics   Alcohol use: No    Alcohol/week: 0.0 standard drinks   Drug use: No    Comment: last use begin of preg    Home Medications Prior to Admission medications   Medication Sig Start Date End Date Taking? Authorizing Provider  lidocaine (LIDODERM) 5 % Place 1 patch onto the skin daily. Remove & Discard patch within 12 hours or as directed by MD 10/25/20  Yes Myron Stankovich, Lupe Carney R, PA-C  benzonatate (TESSALON) 100 MG capsule Take 1 capsule (100 mg total) by  mouth every 8 (eight) hours. 04/22/20   Rhys Martini, PA-C  fluticasone (FLONASE) 50 MCG/ACT nasal spray Place 1 spray into both nostrils daily. 10/17/20 10/17/21  Orvil Feil, PA-C  HYDROcodone-acetaminophen (NORCO/VICODIN) 5-325 MG tablet Take 1 tablet by mouth every 6 (six) hours as needed for moderate pain or severe pain. 08/01/20   Jodell Cipro, PA-C  penicillin v potassium (VEETID) 500 MG tablet Take 1 tablet (500 mg total) by mouth 3 (three) times daily. 08/01/20   Jodell Cipro, PA-C  promethazine-dextromethorphan (PROMETHAZINE-DM) 6.25-15 MG/5ML syrup Take 5 mLs by mouth 4 (four) times daily as needed for cough. 04/22/20   Rhys Martini, PA-C    Allergies    Patient has no known allergies.  Review of Systems   Review of Systems  Constitutional: Negative.   HENT:  Positive for ear pain. Negative for congestion, dental problem, drooling, ear discharge, facial swelling, hearing loss, mouth sores, nosebleeds, postnasal drip, rhinorrhea, sinus  pressure, sinus pain, sneezing, sore throat, tinnitus, trouble swallowing and voice change.   Respiratory: Negative.    Cardiovascular: Negative.   Gastrointestinal: Negative.   Musculoskeletal:  Positive for myalgias.  Neurological:  Positive for headaches. Negative for dizziness, tremors, syncope, weakness and light-headedness.   Physical Exam Updated Vital Signs BP 115/87 (BP Location: Left Arm)   Pulse 82   Temp 99 F (37.2 C) (Oral)   Resp 16   Ht 5\' 3"  (1.6 m)   Wt 113.4 kg   LMP 10/07/2020 (Exact Date)   SpO2 100%   BMI 44.29 kg/m   Physical Exam Vitals and nursing note reviewed.  Constitutional:      Appearance: She is obese. She is not toxic-appearing.  HENT:     Head: Normocephalic and atraumatic.     Right Ear: Hearing, ear canal and external ear normal. A middle ear effusion is present.     Left Ear: Hearing, tympanic membrane, ear canal and external ear normal.     Ears:      Nose: Nose normal.     Mouth/Throat:     Palate: No mass.     Pharynx: Oropharynx is clear. Uvula midline.     Tonsils: No tonsillar exudate.  Eyes:     General: Lids are normal. Vision grossly intact. No scleral icterus.       Right eye: No discharge.        Left eye: No discharge.     Extraocular Movements: Extraocular movements intact.     Conjunctiva/sclera: Conjunctivae normal.     Pupils: Pupils are equal, round, and reactive to light.  Neck:     Trachea: Trachea and phonation normal.     Comments: Bilateral cervical paraspinous musculature spasm and tenderness to palpation without crepitus.  F ROM of the cervical spine without difficulty both passively and actively without pain Cardiovascular:     Rate and Rhythm: Normal rate and regular rhythm.     Pulses: Normal pulses.     Heart sounds: Normal heart sounds. No murmur heard. Pulmonary:     Effort: Pulmonary effort is normal. No tachypnea, bradypnea, accessory muscle usage or prolonged expiration.     Breath sounds: Normal  breath sounds.  Chest:     Chest wall: No mass, lacerations, deformity, swelling, tenderness, crepitus or edema.  Abdominal:     Palpations: Abdomen is soft.     Tenderness: There is no right CVA tenderness, left CVA tenderness, guarding or rebound.  Musculoskeletal:     Cervical back:  Full passive range of motion without pain and normal range of motion. No edema, rigidity or crepitus. Muscular tenderness present. No pain with movement or spinous process tenderness.     Right lower leg: No edema.     Left lower leg: No edema.  Lymphadenopathy:     Cervical: No cervical adenopathy.  Skin:    General: Skin is warm and dry.     Capillary Refill: Capillary refill takes less than 2 seconds.  Neurological:     General: No focal deficit present.     Mental Status: She is alert and oriented to person, place, and time.     Cranial Nerves: Cranial nerves are intact.     Sensory: Sensation is intact.     Motor: Motor function is intact.     Gait: Gait is intact.  Psychiatric:        Mood and Affect: Mood normal.    ED Results / Procedures / Treatments   Labs (all labs ordered are listed, but only abnormal results are displayed) Labs Reviewed - No data to display  EKG None  Radiology No results found.  Procedures Procedures   Medications Ordered in ED Medications  lidocaine (LIDODERM) 5 % 1 patch (1 patch Transdermal Patch Applied 10/25/20 1946)  acetaminophen (TYLENOL) tablet 650 mg (650 mg Oral Given 10/25/20 1929)    ED Course  I have reviewed the triage vital signs and the nursing notes.  Pertinent labs & imaging results that were available during my care of the patient were reviewed by me and considered in my medical decision making (see chart for details).    MDM Rules/Calculators/A&P                        30 year old female presents with concern for chronic headaches and right ear pain x3 weeks.  Differential diagnosis for the headache includes but is not limited to  migraine, tension type headache, cluster headache, trigeminal neuralgia.  Differential diagnosis for your pain includes but is limited to otitis media, otitis externa, serous effusion, eustachian tube dysfunction.  Vital signs are normal intake.  Physical exam is very reassuring.  Cardiopulmonary exam is normal abdominal exam is benign.  EGD exam did reveal middle ear effusion of the right ear without signs of infection.  Suspect eustachian tube dysfunction likely secondary to seasonal allergies.  Will have patient begin antihistamine.  Additionally spasm tenderness palpation of the paraspinous musculature of the neck bilaterally without crepitus.  Suspect this is the source of the patient's headache causing tension type headaches.  Will recommend NSAID, heat application, massage, and topical pain relief.  Dron patch is offered in the emergency department.  No antibiotics warranted based on physical exam.  No further work-up warranted in ED at this time.  Recommend close follow-up with your primary care doctor.  Tacoya voiced understanding of her medical evaluation to plan.  She for questions was answered to her expressed satisfaction.  Return precautions are given.  Patient is well-appearing, stable, and appropriate for discharge at this time.  This chart was dictated using voice recognition software, Dragon. Despite the best efforts of this provider to proofread and correct errors, errors may still occur which can change documentation meaning.  Final Clinical Impression(s) / ED Diagnoses Final diagnoses:  Acute non intractable tension-type headache  Dysfunction of right eustachian tube    Rx / DC Orders ED Discharge Orders          Ordered  lidocaine (LIDODERM) 5 %  Every 24 hours        10/25/20 1939             Sherrilee GillesSponseller, Seanna Sisler R, PA-C 10/25/20 2215    Tegeler, Canary Brimhristopher J, MD 10/25/20 2249

## 2020-10-25 NOTE — ED Notes (Signed)
RN reviewed discharge instructions w/ pt. Follow up and medications reviewed, pt had no further questions °

## 2020-10-25 NOTE — ED Triage Notes (Signed)
Right ear pain with whitish colored discharge x 3-4 weeks. Reports being seen at [facility] with prescribed ear drops with no improvement.   Also adds having  (right sided ) headaches with photosensitivity. Denies any nausea and vomiting

## 2020-10-25 NOTE — Discharge Instructions (Addendum)
You were seen in the emergency department today for your headache and your ear pain.  Your physical exam and vital signs are very reassuring.  Regarding your ear pain, there is no sign of infection.  You have some fluid by your ear.  This is likely secondary to seasonal allergies.  Please begin taking over-the-counter allergy medicine such as cetirizine (brand is Zyrtec) or loratadine (brand is Claritin).  The generic over-the-counter brand is just as effective as the brand name.  Begin taking this daily to help with drainage of her middle ear.  The muscles in your neck are in what is called spasm, meaning they are inappropriately tightened up.  This is likely what is causing your headaches, call tension type headaches as those muscles pull on the back of your head where they attach to your skull.  This can be quite painful.  To help with your pain you may take Tylenol and / or NSAID medication (such as ibuprofen or naproxen) to help with your pain.   You may also utilize topical pain relief such as Biofreeze, IcyHot, or topical lidocaine patches.  I also recommend that you apply heat to the area, such as a hot shower or heating pad, and follow heat application with massage of the muscles that are most tight.  Please return to the emergency department if you develop any numbness/tingling/weakness in your arms or legs, any difficulty urinating, or urinary incontinence chest pain, shortness of breath, abdominal pain, nausea or vomiting that does not stop, or any other new severe symptoms.

## 2022-12-29 ENCOUNTER — Encounter (HOSPITAL_COMMUNITY): Payer: Self-pay | Admitting: *Deleted

## 2022-12-29 ENCOUNTER — Emergency Department (HOSPITAL_COMMUNITY)
Admission: EM | Admit: 2022-12-29 | Discharge: 2022-12-29 | Disposition: A | Discharge status: Home / Self Care | Payer: Medicaid Other | Attending: Emergency Medicine | Admitting: Emergency Medicine

## 2022-12-29 ENCOUNTER — Other Ambulatory Visit: Payer: Self-pay

## 2022-12-29 DIAGNOSIS — Z79899 Other long term (current) drug therapy: Secondary | ICD-10-CM | POA: Diagnosis not present

## 2022-12-29 DIAGNOSIS — I10 Essential (primary) hypertension: Secondary | ICD-10-CM | POA: Insufficient documentation

## 2022-12-29 DIAGNOSIS — K047 Periapical abscess without sinus: Secondary | ICD-10-CM

## 2022-12-29 DIAGNOSIS — R82998 Other abnormal findings in urine: Secondary | ICD-10-CM | POA: Diagnosis not present

## 2022-12-29 DIAGNOSIS — K0889 Other specified disorders of teeth and supporting structures: Secondary | ICD-10-CM | POA: Diagnosis present

## 2022-12-29 LAB — URINALYSIS, ROUTINE W REFLEX MICROSCOPIC
Bilirubin Urine: NEGATIVE
Glucose, UA: NEGATIVE mg/dL
Hgb urine dipstick: NEGATIVE
Ketones, ur: NEGATIVE mg/dL
Leukocytes,Ua: NEGATIVE
Nitrite: NEGATIVE
Protein, ur: NEGATIVE mg/dL
Specific Gravity, Urine: 1.013 (ref 1.005–1.030)
pH: 7 (ref 5.0–8.0)

## 2022-12-29 MED ORDER — CLINDAMYCIN HCL 300 MG PO CAPS: 300.0000 mg | ORAL_CAPSULE | Freq: Four times a day (QID) | ORAL | 0 refills | Status: AC

## 2022-12-29 NOTE — ED Provider Notes (Signed)
[place] EMERGENCY DEPARTMENT AT [facility] Provider Note   CSN: [id number] Arrival date & time: 12/29/22  1252     History  Chief Complaint  Patient presents with   Dental Pain   Malodorous urine    [name] is a 32 y.o. female.   Dental Pain    Patient has history of hypertension prior UTIs pulmonary embolism who presents to the ED with complaints of dental pain and cloudy urine.  Patient states she has noticed when she wakes up in the morning her urine is been cloudy.  She is not having any pain.  No fevers or chills.  Patient also has noticed a bump and soreness on the gums in her lower mandible area.  Patient states her teeth are tender.  She has some pain going up towards her ear.  She denies any difficulty swallowing.  No swelling  Home Medications Prior to Admission medications   Medication Sig Start Date End Date Taking? Authorizing Provider  clindamycin (CLEOCIN) 300 MG capsule Take 1 capsule (300 mg total) by mouth 4 (four) times daily for 7 days. 12/29/22 01/05/23 Yes Linwood Dibbles, MD  benzonatate (TESSALON) 100 MG capsule Take 1 capsule (100 mg total) by mouth every 8 (eight) hours. 04/22/20   Rhys Martini, PA-C  fluticasone (FLONASE) 50 MCG/ACT nasal spray Place 1 spray into both nostrils daily. 10/17/20 10/17/21  Orvil Feil, PA-C  HYDROcodone-acetaminophen (NORCO/VICODIN) 5-325 MG tablet Take 1 tablet by mouth every 6 (six) hours as needed for moderate pain or severe pain. 08/01/20   Ward, Tylene Fantasia, PA-C  lidocaine (LIDODERM) 5 % Place 1 patch onto the skin daily. Remove & Discard patch within 12 hours or as directed by MD 10/25/20   Sponseller, Lupe Carney R, PA-C  penicillin v potassium (VEETID) 500 MG tablet Take 1 tablet (500 mg total) by mouth 3 (three) times daily. 08/01/20   Ward, Tylene Fantasia, PA-C  promethazine-dextromethorphan (PROMETHAZINE-DM) 6.25-15 MG/5ML syrup Take 5 mLs by mouth 4 (four) times daily as needed for cough. 04/22/20   Rhys Martini, PA-C      Allergies    Penicillins    Review of Systems   Review of Systems  Physical Exam Updated Vital Signs BP 133/80 (BP Location: Right Arm)   Pulse (!) 103   Temp 98.9 F (37.2 C)   Resp 14   Ht 1.6 m (5\' 3" )   Wt 116.6 kg   SpO2 99%   BMI 45.53 kg/m  Physical Exam Vitals and nursing note reviewed.  Constitutional:      General: She is not in acute distress.    Appearance: She is well-developed.  HENT:     Head: Normocephalic and atraumatic.     Comments: No obvious oropharyngeal swelling noted on exam, findings suggestive of gingivitis, mild dental pain, submandibular lymph node tenderness, normal TMs bilateral ear exam    Right Ear: External ear normal.     Left Ear: External ear normal.  Eyes:     General: No scleral icterus.       Right eye: No discharge.        Left eye: No discharge.     Conjunctiva/sclera: Conjunctivae normal.  Neck:     Trachea: No tracheal deviation.  Cardiovascular:     Rate and Rhythm: Normal rate.  Pulmonary:     Effort: Pulmonary effort is normal. No respiratory distress.     Breath sounds: No stridor.  Abdominal:  General: There is no distension.  Musculoskeletal:        General: No swelling or deformity.     Cervical back: Neck supple.  Skin:    General: Skin is warm and dry.     Findings: No rash.  Neurological:     Mental Status: She is alert. Mental status is at baseline.     Cranial Nerves: No dysarthria or facial asymmetry.     Motor: No seizure activity.     ED Results / Procedures / Treatments   Labs (all labs ordered are listed, but only abnormal results are displayed) Labs Reviewed  URINALYSIS, ROUTINE W REFLEX MICROSCOPIC    EKG None  Radiology No results found.  Procedures Procedures    Medications Ordered in ED Medications - No data to display  ED Course/ Medical Decision Making/ A&P                                 Medical Decision Making Amount and/or Complexity of Data  Reviewed Labs: ordered.  Risk Prescription drug management.   Patient complained of cloudy urine.  Her urinalysis is normal here in the ED.  No findings to suggest infection.  No proteinuria.  Will have her follow-up with primary care doctor to be rechecked if this persists  She also complains of dental pain.  .  No obvious signs of facial abscess or swelling.  No difficulty handling her secretions or opening her mouth.  Will start her on a course of antibioticsRecommend outpatient follow-up with dentist        Final Clinical Impression(s) / ED Diagnoses Final diagnoses:  Dental infection    Rx / DC Orders ED Discharge Orders          Ordered    clindamycin (CLEOCIN) 300 MG capsule  4 times daily        12/29/22 1551              Linwood Dibbles, MD 12/29/22 1554

## 2022-12-29 NOTE — Discharge Instructions (Addendum)
Take the antibiotics as prescribed.  Follow up with a dentist for further evaluation

## 2022-12-29 NOTE — ED Triage Notes (Signed)
PT presents with 2 complaints.   States L lower she pulled out her L lower molar 2 weeks ago.  For 3 days she has been experiencing increasing L lower jaw pain and swelling.  Also states foul smelling, cloudy urine in the mornings.  Denies back pain, abdominal pain, pain with urination or frequent urination.

## 2023-05-02 ENCOUNTER — Other Ambulatory Visit: Payer: Self-pay

## 2023-05-02 ENCOUNTER — Emergency Department (HOSPITAL_COMMUNITY): Payer: Medicaid Other

## 2023-05-02 ENCOUNTER — Emergency Department (HOSPITAL_COMMUNITY)
Admission: EM | Admit: 2023-05-02 | Discharge: 2023-05-02 | Disposition: A | Discharge status: Home / Self Care | Payer: Medicaid Other | Attending: Emergency Medicine | Admitting: Emergency Medicine

## 2023-05-02 DIAGNOSIS — R079 Chest pain, unspecified: Secondary | ICD-10-CM | POA: Diagnosis not present

## 2023-05-02 DIAGNOSIS — I10 Essential (primary) hypertension: Secondary | ICD-10-CM | POA: Diagnosis not present

## 2023-05-02 DIAGNOSIS — R519 Headache, unspecified: Secondary | ICD-10-CM | POA: Diagnosis present

## 2023-05-02 LAB — BASIC METABOLIC PANEL
Anion gap: 13 (ref 5–15)
BUN: 10 mg/dL (ref 6–20)
CO2: 23 mmol/L (ref 22–32)
Calcium: 9 mg/dL (ref 8.9–10.3)
Chloride: 103 mmol/L (ref 98–111)
Creatinine, Ser: 0.84 mg/dL (ref 0.44–1.00)
GFR, Estimated: >60 mL/min (ref >60)
Glucose, Bld: 116 mg/dL — ABNORMAL HIGH (ref 70–99)
Potassium: 3.7 mmol/L (ref 3.5–5.1)
Sodium: 139 mmol/L (ref 135–145)

## 2023-05-02 LAB — CBC
HCT: 39.9 % (ref 36.0–46.0)
Hemoglobin: 12.9 g/dL (ref 12.0–15.0)
MCH: 29.1 pg (ref 26.0–34.0)
MCHC: 32.3 g/dL (ref 30.0–36.0)
MCV: 90.1 fL (ref 80.0–100.0)
Platelets: 331 10*3/uL (ref 150–400)
RBC: 4.43 MIL/uL (ref 3.87–5.11)
RDW: 12.9 % (ref 11.5–15.5)
WBC: 7.9 10*3/uL (ref 4.0–10.5)
nRBC: 0 % (ref 0.0–0.2)

## 2023-05-02 LAB — URINALYSIS, ROUTINE W REFLEX MICROSCOPIC
Bilirubin Urine: NEGATIVE
Glucose, UA: NEGATIVE mg/dL
Hgb urine dipstick: NEGATIVE
Ketones, ur: NEGATIVE mg/dL
Leukocytes,Ua: NEGATIVE
Nitrite: NEGATIVE
Protein, ur: NEGATIVE mg/dL
Specific Gravity, Urine: 1.019 (ref 1.005–1.030)
pH: 6 (ref 5.0–8.0)

## 2023-05-02 LAB — TROPONIN I (HIGH SENSITIVITY): Troponin I (High Sensitivity): 2 ng/L (ref <18)

## 2023-05-02 LAB — HEPATIC FUNCTION PANEL
ALT: 14 U/L (ref 0–44)
AST: 14 U/L — ABNORMAL LOW (ref 15–41)
Albumin: 3.2 g/dL — ABNORMAL LOW (ref 3.5–5.0)
Alkaline Phosphatase: 84 U/L (ref 38–126)
Bilirubin, Direct: <0.1 mg/dL (ref 0.0–0.2)
Total Bilirubin: 0.4 mg/dL (ref 0.0–1.2)
Total Protein: 7.2 g/dL (ref 6.5–8.1)

## 2023-05-02 LAB — HCG, SERUM, QUALITATIVE: Preg, Serum: NEGATIVE

## 2023-05-02 LAB — D-DIMER, QUANTITATIVE: D-Dimer, Quant: <0.27 ug{FEU}/mL (ref 0.00–0.50)

## 2023-05-02 LAB — LIPASE, BLOOD: Lipase: 25 U/L (ref 11–51)

## 2023-05-02 MED ORDER — KETOROLAC TROMETHAMINE 60 MG/2ML IM SOLN
15.0000 mg | Freq: Once | INTRAMUSCULAR | Status: AC
  Administered 2023-05-02: 15 mg via INTRAMUSCULAR
  Filled 2023-05-02: qty 2

## 2023-05-02 MED ORDER — VALACYCLOVIR HCL 1 G PO TABS: 1000.0000 mg | ORAL_TABLET | Freq: Every day | ORAL | 0 refills | Status: AC

## 2023-05-02 NOTE — Discharge Instructions (Addendum)
Please follow-up with your primary care provider the 1 I attached here for you today in regards recent ER visit.  Today labs imaging are reassuring.  Please use Tylenol or ibuprofen every 6 hours as needed for pain.  I have refilled your Valtrex for you but you will need to follow-up with a primary care provider.  If symptoms change or worsen please return to the ER.

## 2023-05-02 NOTE — ED Provider Triage Note (Signed)
Emergency Medicine Provider Triage Evaluation Note  [name] , a 33 y.o. female  was evaluated in triage.  Pt complains of multiple complaints including 4 days of left-sided squeezing chest pain, mild headache, abdominal cramping, cloudy urine, intermittent shortness of breath, and feeling that she is having a genital herpes outbreak.  Review of Systems  Positive: Chest pain, shortness of breath, mild headache, abdominal cramping, urine changes, vaginal rash Negative: Nausea, vomiting, constipation, diarrhea, vaginal discharge, traumatic injuries, leg pain, leg swelling  Physical Exam  BP 121/71 (BP Location: Right Arm)   Pulse 99   Temp 98.9 F (37.2 C) (Oral)   Resp 20   Ht 5\' 4"  (1.626 m)   Wt 117 kg   LMP 03/30/2023   SpO2 100%   BMI 44.29 kg/m  Gen:   Awake, no distress, resting comfortably in exam chair Resp:  Normal effort, clear breath sounds bilaterally MSK:   Moves extremities without difficulty, chest nontender, abdomen not focally tender.  Good bowel sounds Other:  No leg tenderness or leg swelling  Medical Decision Making  Medically screening exam initiated at 8:34 AM.  Appropriate orders placed.  JENIFFER KOSTERS was informed that the remainder of the evaluation will be completed by another provider, this initial triage assessment does not replace that evaluation, and the importance of remaining in the ED until their evaluation is complete.  [name] is a 33 y.o. female with a past medical history significant for hypertension, previous pulmonary embolism not on anticoagulation now, previous tubal ligation, genital herpes who presents with multiple complaints.  Patient reports that over the last 4 days she has had all the symptoms again with some squeezing left-sided chest discomfort.  It is not pleuritic and she intermittently had some shortness of breath with it.  She is not short right now.  She denies fevers, chills, or cough.  She denies any infectious symptoms.   She denies any nausea or vomiting but does have some mild abdominal discomfort.  She denies any trauma.  She reports that she has had no constipation or diarrhea but says that her urine has been cloudy.  She also says that she is having some rash breakout in her groin area that appears similar to when she previously had genital herpes and said that she was on Valtrex before.  She does out of that medication at this time.  She reports her chest pain is not pleuritic however EKG on arrival does show a new somewhat S1Q3T3 pattern.  Given her history of pulmonary emboli not on anticoagulation now, a dimer will be added.  She will get troponins, chest x-ray, and other labs to rule out concerning causes of abdominal pain and her urinary changes.  As her abdomen is not focally tender, will hold on advanced imaging initially.  Will defer to later team to see if she needs a pelvic exam to reevaluate her groin rash versus treat empirically with Valtrex.  She will wait for her full evaluation.      Shandrika Ambers, Canary Brim, MD 05/02/23 [phone]

## 2023-05-02 NOTE — ED Triage Notes (Signed)
Pt. Stated,,Ive had a headache along with chest pain and I have those together , Ive had this for about 4 days. Im also peeing a lot and it looks cloudy.

## 2023-05-02 NOTE — ED Provider Notes (Signed)
[place] EMERGENCY DEPARTMENT AT [facility] Provider Note   CSN: [id number] Arrival date & time: 05/02/23  4098     History  Chief Complaint  Patient presents with   Chest Pain   Headache    [name] is a 33 y.o. female Struve HSV 2, BV, hypertension, PE during pregnancy not currently anticoagulated presented with 4 days of chest pain and headache.  Patient states the headache is like a band around her head and does get better with Aleve.  Patient denies new onset weakness vision changes jaw claudication neck pain fevers.  Patient states that her urine does appear cloudy however does endorse decreased fluid intake.  Patient states that the chest pain is generalized and is not sharp or ripping in nature and there is no associated shortness of breath.  Patient denies abdominal pain nausea vomiting dysuria.  Patient states she is out of her Valtrex and would like to be prescribed few days worth of medications and be given a primary care provider to follow-up with as she is new to the area.    Home Medications Prior to Admission medications   Medication Sig Start Date End Date Taking? Authorizing Provider  naproxen (NAPROSYN) 500 MG tablet Take 500 mg by mouth daily as needed for mild pain (pain score 1-3) or moderate pain (pain score 4-6).   Yes [provider]  valACYclovir (VALTREX) 1000 MG tablet Take 1 tablet (1,000 mg total) by mouth daily for 3 days. 05/02/23 05/05/23 Yes Netta Corrigan, PA-C      Allergies    Penicillins    Review of Systems   Review of Systems  Cardiovascular:  Positive for chest pain.  Neurological:  Positive for headaches.    Physical Exam Updated Vital Signs BP 121/71 (BP Location: Right Arm)   Pulse 99   Temp 98.9 F (37.2 C) (Oral)   Resp 20   Ht 5\' 4"  (1.626 m)   Wt 117 kg   LMP 03/30/2023   SpO2 100%   BMI 44.29 kg/m  Physical Exam Constitutional:      Appearance: She is normal weight.  HENT:     Head:  Normocephalic.     Comments: No temporal artery tenderness No jaw claudication Eyes:     Extraocular Movements: Extraocular movements intact.     Conjunctiva/sclera: Conjunctivae normal.     Pupils: Pupils are equal, round, and reactive to light.  Cardiovascular:     Rate and Rhythm: Normal rate and regular rhythm.     Pulses: Normal pulses.     Heart sounds: Normal heart sounds.  Pulmonary:     Effort: Pulmonary effort is normal.     Breath sounds: Normal breath sounds.  Abdominal:     Palpations: Abdomen is soft.     Tenderness: There is no abdominal tenderness. There is no guarding or rebound.  Musculoskeletal:        General: No tenderness. Normal range of motion.     Cervical back: Normal range of motion. No rigidity or tenderness.     Right lower leg: No edema.     Left lower leg: No edema.     Comments: No calf tenderness  Skin:    General: Skin is warm and dry.     Capillary Refill: Capillary refill takes less than 2 seconds.  Neurological:     General: No focal deficit present.     Mental Status: She is alert.     Sensory: Sensation  is intact.     Motor: Motor function is intact.     Coordination: Coordination is intact.     Gait: Gait is intact.     Comments: Cranial nerves III through XII intact Vision grossly intact Sensation tact in all 4 extremities  Psychiatric:        Mood and Affect: Mood normal.     ED Results / Procedures / Treatments   Labs (all labs ordered are listed, but only abnormal results are displayed) Labs Reviewed  BASIC METABOLIC PANEL - Abnormal; Notable for the following components:      Result Value   Glucose, Bld 116 (*)    All other components within normal limits  HEPATIC FUNCTION PANEL - Abnormal; Notable for the following components:   Albumin 3.2 (*)    AST 14 (*)    All other components within normal limits  RESP PANEL BY RT-PCR (RSV, FLU A&B, COVID)  RVPGX2  CBC  HCG, SERUM, QUALITATIVE  URINALYSIS, ROUTINE W REFLEX  MICROSCOPIC  LIPASE, BLOOD  D-DIMER, QUANTITATIVE  TROPONIN I (HIGH SENSITIVITY)  TROPONIN I (HIGH SENSITIVITY)    EKG EKG Interpretation Date/Time:  Thursday May 02 2023 08:22:24 EST Ventricular Rate:  100 PR Interval:  142 QRS Duration:  82 QT Interval:  352 QTC Calculation: 454 R Axis:   26  Text Interpretation: Normal sinus rhythm Cannot rule out Inferior infarct , age undetermined Abnormal ECG When compared with ECG of 23-Feb-2019 21:09, PREVIOUS ECG IS PRESENT new q wave in lead 3 when compared to prior No STEMI Confirmed by Theda Belfast ([id number]) on 05/02/2023 8:28:08 AM  Radiology DG Chest 2 View Result Date: 05/02/2023 CLINICAL DATA:  Chest pain. EXAM: CHEST - 2 VIEW COMPARISON:  Chest radiograph dated February 23, 2019. FINDINGS: The heart size and mediastinal contours are within normal limits. Both lungs are clear. The visualized skeletal structures are unremarkable. IMPRESSION: No acute cardiopulmonary findings. Electronically Signed   By: Hart Robinsons M.D.   On: 05/02/2023 09:23    Procedures Procedures    Medications Ordered in ED Medications  ketorolac (TORADOL) injection 15 mg (has no administration in time range)    ED Course/ Medical Decision Making/ A&P                                 Medical Decision Making Amount and/or Complexity of Data Reviewed Labs: ordered. Radiology: ordered.  Risk Prescription drug management.   [name] 33 y.o. presented today for headache. Working DDx that I considered at this time includes, but not limited to, tension headache, migraine, intracranial mass, intracranial hemorrhage, intracranial infection including meningitis vs encephalitis, GCA, trigeminal neuralgia, preeclampsia/eclampsia, AVM, sinusitis, cerebral aneurysm, muscular headache, cavernous sinus thrombosis, carotid artery dissection.  R/o DDx: intracranial mass, intracranial hemorrhage, intracranial infection including meningitis vs  encephalitis, GCA, trigeminal neuralgia, preeclampsia/eclampsia, AVM, sinusitis, cerebral aneurysm, muscular headache, cavernous sinus thrombosis, carotid artery dissection, ACS, PE, pneumonia:  less likely due to history of present illness, physical exam, labs/imaging findings  Review of prior external notes: 12/29/2022 ED  Unique Tests and My Interpretation:  CBC: Unremarkable BMP: Unremarkable UA: Unremarkable Hepatic function panel: Unremarkable D-dimer: Negative Troponin: 2 Serum qualitative: Negative Chest x-ray: No acute findings EKG: Sinus 100 bpm, no ST elevations or depressions noted, no signs of ischemia  Social Determinants of Health: none  Discussion with Independent Historian: None  Discussion of Management of Tests: None  Risk: Medium:  prescription drug management  Risk Stratification Score:  HEART Score: 1  Plan: On exam patient was no acute distress with stable vitals. Physical exam was unremarkable. Presentation is like pts typical HA and non concerning for [facility], ICH, Meningitis, temporal arteritis, or other life-threatening headaches.  No neurodeficits that would necessitate CT imaging at this time.  Pt is afebrile with no focal neuro deficits, nuchal rigidity, or change in vision. Labs and imaging will be ordered. Patient will be given Toradol for HA treatment.  In terms of patient's chest pain patient's cardiac workup is unremarkable she has negative D-dimer and negative troponin.  Patient chest pain is been going on longer than 4 hours at this time we do not need a second as she is not currently having chest discomfort.  I spoke to the patient and agreed to give her a few days worth of Valtrex and a primary care provider to follow-up with.  I spoke to the patient about testing for viral illnesses to which patient stated that she was initially interested in however does not want to be swabbed as she does not want swab of her nose and so we will not obtain a respiratory  panel at this time as per patient's request. encourage patient use Tylenol or ibuprofen every 6 hours needed for pain remain hydrated follow-up with a primary care provider.  Patient headache treated and improved while in ED. Patient is to follow up with PCP to discuss prophylactic medication.  Discussed taking Tylenol every 6 hours as needed for pain.  Patient was given return precautions. Patient stable for discharge at this time.  Patient verbalized understanding of plan.  This chart was dictated using voice recognition software.  Despite best efforts to proofread,  errors can occur which can change the documentation meaning.         Final Clinical Impression(s) / ED Diagnoses Final diagnoses:  Bad headache    Rx / DC Orders ED Discharge Orders          Ordered    valACYclovir (VALTREX) 1000 MG tablet  Daily        05/02/23 1125              Netta Corrigan, PA-C 05/02/23 1138    Anders Simmonds T, DO 05/03/23 2246

## 2023-07-10 ENCOUNTER — Ambulatory Visit (HOSPITAL_COMMUNITY)
Admission: RE | Admit: 2023-07-10 | Discharge: 2023-07-10 | Disposition: A | Discharge status: Home / Self Care | Source: Ambulatory Visit | Attending: Family Medicine | Admitting: Family Medicine

## 2023-07-10 ENCOUNTER — Encounter (HOSPITAL_COMMUNITY): Payer: Self-pay

## 2023-07-10 VITALS — BP 129/89 | HR 92 | Temp 98.5°F | Resp 18

## 2023-07-10 DIAGNOSIS — R300 Dysuria: Secondary | ICD-10-CM

## 2023-07-10 DIAGNOSIS — K047 Periapical abscess without sinus: Secondary | ICD-10-CM

## 2023-07-10 LAB — POCT URINALYSIS DIP (MANUAL ENTRY)
Bilirubin, UA: NEGATIVE
Blood, UA: NEGATIVE
Glucose, UA: NEGATIVE mg/dL
Ketones, POC UA: NEGATIVE mg/dL
Leukocytes, UA: NEGATIVE
Nitrite, UA: NEGATIVE
Protein Ur, POC: NEGATIVE mg/dL
Spec Grav, UA: 1.025 (ref 1.010–1.025)
Urobilinogen, UA: 0.2 U/dL
pH, UA: 6 (ref 5.0–8.0)

## 2023-07-10 MED ORDER — CEPHALEXIN 500 MG PO CAPS: 500.0000 mg | ORAL_CAPSULE | Freq: Three times a day (TID) | ORAL | 0 refills | Status: AC

## 2023-07-10 MED ORDER — PHENAZOPYRIDINE HCL 200 MG PO TABS: 200.0000 mg | ORAL_TABLET | Freq: Three times a day (TID) | ORAL | 0 refills | Status: AC

## 2023-07-10 MED ORDER — IBUPROFEN 800 MG PO TABS: 800.0000 mg | ORAL_TABLET | Freq: Three times a day (TID) | ORAL | 0 refills | Status: AC

## 2023-07-10 NOTE — ED Triage Notes (Signed)
 Pt c/o bladder pain, urine has a "boiled egg smell", and urgency/frequency x2 days.  Pt c/o lt lower toothache since today. Denies taken any meds.

## 2023-07-10 NOTE — ED Provider Notes (Addendum)
 [facility]  Note:  This document was prepared using Dragon voice recognition software and may include unintentional dictation errors.  MRN: [id number] DOB: 1991/03/22  Subjective:   [name] is a 33 y.o. female presenting for lower abdominal pressure and dysuria x 2 days as well as left lower dental pain that started today.  Patient reports she knows that she has a broken tooth and is planning on seeing the dentist however she started having pain today.  Patient has not taken any over-the-counter medication to treat symptoms.  As for urinary symptoms patient reports lower abdominal pressure, urine has a "boiled egg smell" and having increased urinary frequency.  Patient reports past history of urinary tract infection with similar symptoms.  No current facility-administered medications for this encounter.  Current Outpatient Medications:  .  cephALEXin (KEFLEX) 500 MG capsule, Take 1 capsule (500 mg total) by mouth 3 (three) times daily for 7 days., Disp: 21 capsule, Rfl: 0 .  ibuprofen (ADVIL) 800 MG tablet, Take 1 tablet (800 mg total) by mouth 3 (three) times daily., Disp: 21 tablet, Rfl: 0 .  phenazopyridine (PYRIDIUM) 200 MG tablet, Take 1 tablet (200 mg total) by mouth 3 (three) times daily., Disp: 6 tablet, Rfl: 0 .  naproxen (NAPROSYN) 500 MG tablet, Take 500 mg by mouth daily as needed for mild pain (pain score 1-3) or moderate pain (pain score 4-6)., Disp: , Rfl:    Allergies  Allergen Reactions  . Penicillins     Past Medical History:  Diagnosis Date  . Bacterial vaginosis 08/15/2016  . Headache(784.0)   . Hypertension   . Incompetent cervix   . Preeclampsia, third trimester 03/21/2017  . Pregnancy induced hypertension   . Preterm labor   . Pulmonary emboli (HCC)    RIGHT LUNG  . Pulmonary embolism during pregnancy, antepartum   . Urinary tract infection      Past Surgical History:  Procedure Laterality Date  . BREAST FIBROADENOMA SURGERY  2006-7.    Removal;right  . CESAREAN SECTION    . CESAREAN SECTION WITH BILATERAL TUBAL LIGATION Bilateral 04/19/2017   Procedure: CESAREAN SECTION WITH BILATERAL TUBAL LIGATION;  Surgeon: Lesly Dukes, MD;  Location: [facility] BIRTHING SU[facility];  Service: Obstetrics;  Laterality: Bilateral;  . DILATION AND CURETTAGE OF UTERUS    . THERAPEUTIC ABORTION    . TUBAL LIGATION      Family History  Problem Relation Age of Onset  . Diabetes Mother   . Diabetes Maternal Grandmother   . Hypertension Maternal Grandmother   . Cancer Maternal Grandmother        colon cancer, brain tumour  . Other Neg Hx     Social History   Tobacco Use  . Smoking status: Every Day    Current packs/day: 0.00    Types: Cigarettes    Last attempt to quit: 08/17/2016    Years since quitting: 6.8  . Smokeless tobacco: Former  Advertising account planner  . Vaping status: Never Used  Substance Use Topics  . Alcohol use: No    Alcohol/week: 0.0 standard drinks of alcohol  . Drug use: No    Comment: last use begin of preg    ROS Refer to HPI for ROS details.  Objective:   Vitals: BP 129/89 (BP Location: Left Arm)   Pulse 92   Temp 98.5 F (36.9 C) (Oral)   Resp 18   LMP 06/28/2023 (Approximate)   SpO2 98%   Physical Exam Vitals and nursing note reviewed.  Constitutional:      General: She is not in acute distress.    Appearance: Normal appearance. She is well-developed. She is not ill-appearing or toxic-appearing.  HENT:     Head: Normocephalic.     Mouth/Throat:     Lips: Pink.     Dentition: Abnormal dentition. Dental tenderness, gingival swelling and dental caries present. No dental abscesses.   Cardiovascular:     Rate and Rhythm: Normal rate.  Pulmonary:     Effort: Pulmonary effort is normal. No respiratory distress.  Abdominal:     General: There is no distension.     Palpations: Abdomen is soft.     Tenderness: There is abdominal tenderness (Bilateral lower abdominal pressure with palpation). There is no  right CVA tenderness, left CVA tenderness, guarding or rebound.  Skin:    General: Skin is warm and dry.  Neurological:     General: No focal deficit present.     Mental Status: She is alert and oriented to person, place, and time.  Psychiatric:        Mood and Affect: Mood normal.    Procedures  Results for orders placed or performed during the hospital encounter of 07/10/23 (from the past 24 hours)  POC urinalysis dipstick     Status: Abnormal   Collection Time: 07/10/23  7:02 PM  Result Value Ref Range   Color, UA yellow yellow   Clarity, UA hazy (A) clear   Glucose, UA negative negative mg/dL   Bilirubin, UA negative negative   Ketones, POC UA negative negative mg/dL   Spec Grav, UA 1.610 9.604 - 1.025   Blood, UA negative negative   pH, UA 6.0 5.0 - 8.0   Protein Ur, POC negative negative mg/dL   Urobilinogen, UA 0.2 0.2 or 1.0 E.U./dL   Nitrite, UA Negative Negative   Leukocytes, UA Negative Negative    Assessment and Plan :   PDMP not reviewed this encounter.  1. Dental infection   2. Dysuria    1. Dental infection (Primary) - ibuprofen (ADVIL) 800 MG tablet; Take 1 tablet (800 mg total) by mouth 3 (three) times daily.  Dispense: 21 tablet; Refill: 0 - cephALEXin (KEFLEX) 500 MG capsule; Take 1 capsule (500 mg total) by mouth 3 (three) times daily for 7 days.  Dispense: 21 capsule; Refill: 0 -Follow-up with dentist as soon as possible for further evaluation and management of broken tooth and dental infection.  2. Dysuria - POC urinalysis dipstick shows no leukocytes, no nitrite, no blood, no sign of urinary tract infection as the cause of symptoms. - phenazopyridine (PYRIDIUM) 200 MG tablet; Take 1 tablet (200 mg total) by mouth 3 (three) times daily.  Dispense: 6 tablet; Refill: 0   -Continue to monitor symptoms for any change in severity if there is any escalation of current symptoms or development of new symptoms follow-up in ER for further evaluation and  management.  Lucky Cowboy   Cable, Wat[place] B, NP 07/10/23 1931    Lucky Cowboy, NP 07/10/23 1932

## 2023-07-10 NOTE — Discharge Instructions (Addendum)
 1. Dental infection (Primary) - ibuprofen (ADVIL) 800 MG tablet; Take 1 tablet (800 mg total) by mouth 3 (three) times daily.  Dispense: 21 tablet; Refill: 0 - cephALEXin (KEFLEX) 500 MG capsule; Take 1 capsule (500 mg total) by mouth 3 (three) times daily for 7 days.  Dispense: 21 capsule; Refill: 0 -Follow-up with dentist as soon as possible for further evaluation and management of broken tooth and dental infection.  2. Dysuria - POC urinalysis dipstick shows no leukocytes, no nitrite, no blood, no sign of urinary tract infection as the cause of symptoms. - phenazopyridine (PYRIDIUM) 200 MG tablet; Take 1 tablet (200 mg total) by mouth 3 (three) times daily.  Dispense: 6 tablet; Refill: 0

## 2023-09-25 ENCOUNTER — Other Ambulatory Visit: Payer: Self-pay

## 2023-09-25 ENCOUNTER — Encounter (HOSPITAL_COMMUNITY): Payer: Self-pay | Admitting: Emergency Medicine

## 2023-09-25 ENCOUNTER — Emergency Department (HOSPITAL_COMMUNITY)
Admission: EM | Admit: 2023-09-25 | Discharge: 2023-09-25 | Disposition: A | Discharge status: Home / Self Care | Attending: Emergency Medicine | Admitting: Emergency Medicine

## 2023-09-25 DIAGNOSIS — L509 Urticaria, unspecified: Secondary | ICD-10-CM | POA: Diagnosis present

## 2023-09-25 DIAGNOSIS — I10 Essential (primary) hypertension: Secondary | ICD-10-CM | POA: Diagnosis not present

## 2023-09-25 MED ORDER — DIPHENHYDRAMINE HCL 25 MG PO TABS: 25.0000 mg | ORAL_TABLET | Freq: Four times a day (QID) | ORAL | 0 refills | Status: AC

## 2023-09-25 MED ORDER — FAMOTIDINE 20 MG PO TABS
20.0000 mg | ORAL_TABLET | Freq: Once | ORAL | Status: AC
  Administered 2023-09-25: 20 mg via ORAL
  Filled 2023-09-25: qty 1

## 2023-09-25 MED ORDER — DIPHENHYDRAMINE HCL 25 MG PO CAPS
25.0000 mg | ORAL_CAPSULE | Freq: Once | ORAL | Status: DC
  Filled 2023-09-25: qty 1

## 2023-09-25 MED ORDER — METHYLPREDNISOLONE SODIUM SUCC 125 MG IJ SOLR
125.0000 mg | Freq: Once | INTRAMUSCULAR | Status: AC
  Administered 2023-09-25: 125 mg via INTRAMUSCULAR
  Filled 2023-09-25: qty 2

## 2023-09-25 MED ORDER — PREDNISONE 20 MG PO TABS: ORAL_TABLET | ORAL | 0 refills | Status: DC

## 2023-09-25 MED ORDER — PREDNISONE 20 MG PO TABS: ORAL_TABLET | ORAL | 0 refills | Status: AC

## 2023-09-25 NOTE — Discharge Instructions (Addendum)
 Take the prescribed medication as directed.  Can just take benadryl  at night if it make you too drowsy. Follow-up with your doctor. Return to the ED for new or worsening symptoms.

## 2023-09-25 NOTE — ED Triage Notes (Signed)
 Pt reports concern for hives and itchiness that started yesterday around 9 pm. Sts she had some fish - denies any known seafood allergy. No throat swelling / resp distress. No medication prior to arrival.

## 2023-09-25 NOTE — ED Notes (Signed)
 Pt has rash/hives noted to bilat wrists and c/o itching in the arms and face.

## 2023-09-25 NOTE — ED Provider Notes (Signed)
  EMERGENCY DEPARTMENT AT [facility] Provider Note   CSN: [id number] Arrival date & time: 09/25/23  0340     Patient presents with: Urticaria   [name] is a 33 y.o. female.   The history is provided by the patient and medical records.  Urticaria   33 y.o. F history of hypertension, headaches, presenting to the ED for possible allergic reaction.  Patient reports she ate some fish yesterday that her brother had made in his food truck, sister made similar type fish for her birthday dinner.  States at the time she felt ok but a while after developed rash to her arms, then progressed to her face.  States it feels very itchy.  She denies any lip or tongue swelling.  Denies any shortness of breath, nausea, or vomiting.  Her only known allergy is penicillin .  She did use a new body wash recently as well but states it was [place] and has never had issues with the products previously.  No other changes in soaps or detergents.  No medication taken PTA.  Prior to Admission medications   Medication Sig Start Date End Date Taking? Authorizing Provider  ibuprofen  (ADVIL ) 800 MG tablet Take 1 tablet (800 mg total) by mouth 3 (three) times daily. 07/10/23   Reddick, Johnathan B, NP  naproxen  (NAPROSYN ) 500 MG tablet Take 500 mg by mouth daily as needed for mild pain (pain score 1-3) or moderate pain (pain score 4-6).    [provider]  phenazopyridine  (PYRIDIUM ) 200 MG tablet Take 1 tablet (200 mg total) by mouth 3 (three) times daily. 07/10/23   Reddick, Johnathan B, NP    Allergies: Penicillins    Review of Systems  Skin:  Positive for rash.  All other systems reviewed and are negative.   Updated Vital Signs BP (!) 145/92 (BP Location: Right Arm)   Pulse 100   Temp 98.4 F (36.9 C)   Resp 16   Ht 5' 4 (1.626 m)   Wt 115.7 kg   SpO2 99%   BMI 43.77 kg/m   Physical Exam Vitals and nursing note reviewed.  Constitutional:      Appearance: She is  well-developed.  HENT:     Head: Normocephalic and atraumatic.     Mouth/Throat:     Comments: No lip or tongue swelling, handling secretions well, no stridor  Eyes:     Conjunctiva/sclera: Conjunctivae normal.     Pupils: Pupils are equal, round, and reactive to light.    Cardiovascular:     Rate and Rhythm: Normal rate and regular rhythm.     Heart sounds: Normal heart sounds.  Pulmonary:     Effort: Pulmonary effort is normal.     Breath sounds: Normal breath sounds.  Abdominal:     General: Bowel sounds are normal.     Palpations: Abdomen is soft.   Musculoskeletal:        General: Normal range of motion.     Cervical back: Normal range of motion.   Skin:    General: Skin is warm and dry.     Findings: Rash present. Rash is urticarial.     Comments: Urticarial rash noted to bilateral arms, face, and neck   Neurological:     Mental Status: She is alert and oriented to person, place, and time.     (all labs ordered are listed, but only abnormal results are displayed) Labs Reviewed - No data to display  EKG: None  Radiology: No results found.   Procedures   Medications Ordered in the ED  methylPREDNISolone sodium succinate (SOLU-MEDROL) 125 mg/2 mL injection 125 mg (has no administration in time range)  diphenhydrAMINE  (BENADRYL ) capsule 25 mg (has no administration in time range)  famotidine  (PEPCID ) tablet 20 mg (has no administration in time range)                                    Medical Decision Making Risk OTC drugs. Prescription drug management.   33 year old female presenting to the ED with urticarial rash.  Does report eating fish yesterday and used a new body wash.  No prior history of seafood allergy in the past.  Urticarial rash present on the face, neck, bilateral arms.  She has no lip or tongue swelling, handling secretions well, no stridor.  No shortness of breath, vomiting, or other GI upset.  She is hemodynamically stable.  No signs  of true anaphylaxis at this time.  She was treated here with Solu-Medrol, Benadryl , and Pepcid  with improvement of her symptoms.  Remains without airway compromise.  Will continue steroid taper, follow-up with PCP.  Return here for new concerns.  Final diagnoses:  Urticaria    ED Discharge Orders          Ordered     09/25/23 0535    diphenhydrAMINE  (BENADRYL ) 25 MG tablet  Every 6 hours        09/25/23 0535    predniSONE  (DELTASONE ) 20 MG tablet        09/25/23 0535               Coretha Dew, PA-C 09/25/23 0547    Ballard Bongo, MD 09/27/23 [phone]

## 2023-12-08 ENCOUNTER — Other Ambulatory Visit: Payer: Self-pay

## 2023-12-08 ENCOUNTER — Emergency Department (HOSPITAL_COMMUNITY)
Admission: EM | Admit: 2023-12-08 | Discharge: 2023-12-08 | Disposition: A | Discharge status: Home / Self Care | Attending: Emergency Medicine | Admitting: Emergency Medicine

## 2023-12-08 ENCOUNTER — Encounter (HOSPITAL_COMMUNITY): Payer: Self-pay

## 2023-12-08 DIAGNOSIS — J029 Acute pharyngitis, unspecified: Secondary | ICD-10-CM | POA: Diagnosis not present

## 2023-12-08 DIAGNOSIS — R0981 Nasal congestion: Secondary | ICD-10-CM | POA: Insufficient documentation

## 2023-12-08 DIAGNOSIS — R067 Sneezing: Secondary | ICD-10-CM | POA: Diagnosis present

## 2023-12-08 DIAGNOSIS — H04203 Unspecified epiphora, bilateral lacrimal glands: Secondary | ICD-10-CM | POA: Diagnosis not present

## 2023-12-08 LAB — RESP PANEL BY RT-PCR (RSV, FLU A&B, COVID)  RVPGX2
Influenza A by PCR: NEGATIVE
Influenza B by PCR: NEGATIVE
Resp Syncytial Virus by PCR: NEGATIVE
SARS Coronavirus 2 by RT PCR: NEGATIVE

## 2023-12-08 LAB — GROUP A STREP BY PCR: Group A Strep by PCR: NOT DETECTED

## 2023-12-08 MED ORDER — CETIRIZINE HCL 10 MG PO TABS: 10.0000 mg | ORAL_TABLET | Freq: Every day | ORAL | 1 refills | Status: AC

## 2023-12-08 NOTE — Discharge Instructions (Addendum)
 Strep testing was negative today.   Covid/flu test is pending-- this will update into your mychart once resulted. Take the prescribed medication as directed. Follow-up with your doctor. Return to the ED for new or worsening symptoms.

## 2023-12-08 NOTE — ED Triage Notes (Signed)
 Pt c/o sore throat and nasal congestion x 2 days. Pt denies fever or cough, has had sneezing. Pt c/o sore eyes and nose.

## 2023-12-08 NOTE — ED Provider Notes (Signed)
 [place] EMERGENCY DEPARTMENT AT [facility] Provider Note   CSN: [id number] Arrival date & time: 12/08/23  9475     Patient presents with: Sore Throat   [name] is a 33 y.o. female.   The history is provided by the patient and medical records.  Sore Throat   33 y.o. F here with sore throat.  States some nasal congestion, itchy/watery eyes, sneezing, and scratchy feeling throat.  States son started back to school this week, thought maybe he brought something home.  Denies fever/chills.  No nausea/vomiting.  No meds PTA.  Prior to Admission medications   Medication Sig Start Date End Date Taking? Authorizing Provider  cetirizine  (ZYRTEC  ALLERGY) 10 MG tablet Take 1 tablet (10 mg total) by mouth daily. 12/08/23  Yes Jarold Olam HERO, PA-C  diphenhydrAMINE  (BENADRYL ) 25 MG tablet Take 1 tablet (25 mg total) by mouth every 6 (six) hours. 09/25/23   Jarold Olam HERO, PA-C  ibuprofen  (ADVIL ) 800 MG tablet Take 1 tablet (800 mg total) by mouth 3 (three) times daily. 07/10/23   Reddick, Johnathan B, NP  naproxen  (NAPROSYN ) 500 MG tablet Take 500 mg by mouth daily as needed for mild pain (pain score 1-3) or moderate pain (pain score 4-6).    [provider]  phenazopyridine  (PYRIDIUM ) 200 MG tablet Take 1 tablet (200 mg total) by mouth 3 (three) times daily. 07/10/23   Reddick, Johnathan B, NP  predniSONE  (DELTASONE ) 20 MG tablet Take 40 mg by mouth daily for 3 days, then 20mg  by mouth daily for 3 days, then 10mg  daily for 3 days 09/25/23   Jarold Olam HERO, PA-C    Allergies: Penicillins    Review of Systems  HENT:  Positive for congestion and sore throat.   Eyes:  Positive for itching.  All other systems reviewed and are negative.   Updated Vital Signs BP 128/79 (BP Location: Right Arm)   Pulse 85   Temp 98.8 F (37.1 C)   Resp 16   Ht 5' 4 (1.626 m)   Wt 117.9 kg   LMP 11/18/2023 (Exact Date)   SpO2 100%   BMI 44.63 kg/m   Physical Exam Vitals and  nursing note reviewed.  Constitutional:      Appearance: She is well-developed.  HENT:     Head: Normocephalic and atraumatic.     Right Ear: Tympanic membrane and ear canal normal.     Left Ear: Tympanic membrane and ear canal normal.     Nose: Congestion and rhinorrhea present. Rhinorrhea is clear.     Comments: + nasal congestion, R > L, clear rhinorrhea    Mouth/Throat:     Comments: Tonsils overall normal in appearance bilaterally without exudate; uvula midline without evidence of peritonsillar abscess; handling secretions appropriately; no difficulty swallowing or speaking; normal phonation without stridor Eyes:     Conjunctiva/sclera: Conjunctivae normal.     Pupils: Pupils are equal, round, and reactive to light.     Comments: Conjunctiva are not injected, eyes are tearing, no purulent discharge, no lid edema or erythema  Cardiovascular:     Rate and Rhythm: Normal rate and regular rhythm.     Heart sounds: Normal heart sounds.  Pulmonary:     Effort: Pulmonary effort is normal.     Breath sounds: Normal breath sounds.  Abdominal:     General: Bowel sounds are normal.     Palpations: Abdomen is soft.  Musculoskeletal:        General:  Normal range of motion.     Cervical back: Normal range of motion.  Skin:    General: Skin is warm and dry.  Neurological:     Mental Status: She is alert and oriented to person, place, and time.     (all labs ordered are listed, but only abnormal results are displayed) Labs Reviewed  GROUP A STREP BY PCR  RESP PANEL BY RT-PCR (RSV, FLU A&B, COVID)  [id number]    EKG: None  Radiology: No results found.   Procedures   Medications Ordered in the ED - No data to display                                  Medical Decision Making Amount and/or Complexity of Data Reviewed ECG/medicine tests: ordered and independent interpretation performed.  Risk OTC drugs.   85 female here with itchy/watery eyes, sneezing, congestion, and sore  throat.  No fever/chills.  Afebrile, non-toxic in appearance here.  Eyes are watering but no conjunctival injection or lid edema/erythema noted on exam.  Does have nasal congestion with clear rhinorrhea.  No tonsillar edema or exudates, handling secretions well, no stridor.  Lungs are clear bilaterally.  Clinically, seems more allergic in nature.  Strep testing is negative.  RVP is pending.  These results will not really change her management today-- she will follow up on results in mychart.  Will start on a daily Zyrtec .  Can follow-up with PCP.  Return here for new concerns.  Final diagnoses:  Sneezing  Nasal congestion  Watery eyes    ED Discharge Orders          Ordered    cetirizine  (ZYRTEC  ALLERGY) 10 MG tablet  Daily        12/08/23 0626               Jarold Olam HERO, PA-C 12/08/23 0631    Theadore Ozell HERO, MD 12/08/23 [phone]
